# Patient Record
Sex: Female | Born: 1950 | Race: Black or African American | Hispanic: No | Marital: Single | Smoking: Current every day smoker
Health system: Southern US, Community
[De-identification: ages and names within clinical notes are randomized; demographics above are authoritative.]

## PROBLEM LIST (undated history)

## (undated) DIAGNOSIS — F319 Bipolar disorder, unspecified: Secondary | ICD-10-CM

## (undated) DIAGNOSIS — M4716 Other spondylosis with myelopathy, lumbar region: Secondary | ICD-10-CM

## (undated) DIAGNOSIS — F419 Anxiety disorder, unspecified: Secondary | ICD-10-CM

## (undated) DIAGNOSIS — I739 Peripheral vascular disease, unspecified: Secondary | ICD-10-CM

## (undated) DIAGNOSIS — G4730 Sleep apnea, unspecified: Secondary | ICD-10-CM

## (undated) DIAGNOSIS — K5900 Constipation, unspecified: Secondary | ICD-10-CM

## (undated) DIAGNOSIS — F1410 Cocaine abuse, uncomplicated: Secondary | ICD-10-CM

## (undated) DIAGNOSIS — F17200 Nicotine dependence, unspecified, uncomplicated: Secondary | ICD-10-CM

## (undated) DIAGNOSIS — E781 Pure hyperglyceridemia: Secondary | ICD-10-CM

## (undated) DIAGNOSIS — M1990 Unspecified osteoarthritis, unspecified site: Secondary | ICD-10-CM

## (undated) DIAGNOSIS — I499 Cardiac arrhythmia, unspecified: Secondary | ICD-10-CM

## (undated) DIAGNOSIS — I872 Venous insufficiency (chronic) (peripheral): Secondary | ICD-10-CM

## (undated) DIAGNOSIS — I471 Supraventricular tachycardia, unspecified: Secondary | ICD-10-CM

## (undated) DIAGNOSIS — M5430 Sciatica, unspecified side: Secondary | ICD-10-CM

## (undated) DIAGNOSIS — M47812 Spondylosis without myelopathy or radiculopathy, cervical region: Secondary | ICD-10-CM

## (undated) DIAGNOSIS — K5904 Chronic idiopathic constipation: Secondary | ICD-10-CM

## (undated) DIAGNOSIS — G894 Chronic pain syndrome: Secondary | ICD-10-CM

## (undated) DIAGNOSIS — M7071 Other bursitis of hip, right hip: Secondary | ICD-10-CM

## (undated) DIAGNOSIS — F449 Dissociative and conversion disorder, unspecified: Secondary | ICD-10-CM

## (undated) DIAGNOSIS — L209 Atopic dermatitis, unspecified: Secondary | ICD-10-CM

## (undated) DIAGNOSIS — M2012 Hallux valgus (acquired), left foot: Secondary | ICD-10-CM

## (undated) DIAGNOSIS — N952 Postmenopausal atrophic vaginitis: Secondary | ICD-10-CM

## (undated) DIAGNOSIS — K219 Gastro-esophageal reflux disease without esophagitis: Secondary | ICD-10-CM

## (undated) DIAGNOSIS — J449 Chronic obstructive pulmonary disease, unspecified: Secondary | ICD-10-CM

## (undated) DIAGNOSIS — G5790 Unspecified mononeuropathy of unspecified lower limb: Secondary | ICD-10-CM

## (undated) DIAGNOSIS — F1014 Alcohol abuse with alcohol-induced mood disorder: Secondary | ICD-10-CM

## (undated) DIAGNOSIS — R252 Cramp and spasm: Secondary | ICD-10-CM

## (undated) DIAGNOSIS — F4310 Post-traumatic stress disorder, unspecified: Secondary | ICD-10-CM

## (undated) DIAGNOSIS — R200 Anesthesia of skin: Secondary | ICD-10-CM

## (undated) DIAGNOSIS — R220 Localized swelling, mass and lump, head: Secondary | ICD-10-CM

## (undated) DIAGNOSIS — I10 Essential (primary) hypertension: Secondary | ICD-10-CM

## (undated) HISTORY — DX: Venous insufficiency (chronic) (peripheral): I87.2

## (undated) HISTORY — PX: COLONOSCOPY: SHX174

## (undated) HISTORY — PX: TONSILLECTOMY: SUR1361

## (undated) HISTORY — DX: Postmenopausal atrophic vaginitis: N95.2

## (undated) HISTORY — DX: Other bursitis of hip, right hip: M70.71

## (undated) HISTORY — DX: Cramp and spasm: R25.2

## (undated) HISTORY — DX: Chronic pain syndrome: G89.4

## (undated) HISTORY — DX: Anesthesia of skin: R20.0

## (undated) HISTORY — PX: FINGER SURGERY: SHX640

## (undated) HISTORY — DX: Nicotine dependence, unspecified, uncomplicated: F17.200

## (undated) HISTORY — PX: REVISION OF SCAR TISSUE RECTUS MUSCLE: SHX2351

## (undated) HISTORY — DX: Pure hyperglyceridemia: E78.1

## (undated) HISTORY — DX: Sciatica, unspecified side: M54.30

## (undated) HISTORY — DX: Atopic dermatitis, unspecified: L20.9

## (undated) HISTORY — DX: Gastro-esophageal reflux disease without esophagitis: K21.9

## (undated) HISTORY — DX: Alcohol abuse with alcohol-induced mood disorder: F10.14

## (undated) HISTORY — DX: Hallux valgus (acquired), left foot: M20.12

## (undated) HISTORY — DX: Chronic idiopathic constipation: K59.04

## (undated) HISTORY — PX: APPENDECTOMY: SHX54

## (undated) HISTORY — DX: Supraventricular tachycardia, unspecified: I47.10

## (undated) HISTORY — DX: Spondylosis without myelopathy or radiculopathy, cervical region: M47.812

## (undated) HISTORY — PX: ABDOMINAL HYSTERECTOMY: SHX81

## (undated) HISTORY — DX: Other spondylosis with myelopathy, lumbar region: M47.16

## (undated) HISTORY — PX: EYE SURGERY: SHX253

## (undated) HISTORY — PX: UVULOPALATOPHARYNGOPLASTY: SHX827

## (undated) HISTORY — DX: Localized swelling, mass and lump, head: R22.0

---

## 2005-12-01 ENCOUNTER — Ambulatory Visit: Payer: Self-pay | Admitting: Family Medicine

## 2005-12-13 ENCOUNTER — Ambulatory Visit (HOSPITAL_COMMUNITY): Admission: RE | Admit: 2005-12-13 | Discharge: 2005-12-13 | Payer: Self-pay | Admitting: Family Medicine

## 2006-03-19 ENCOUNTER — Ambulatory Visit: Payer: Self-pay | Admitting: Family Medicine

## 2006-06-06 ENCOUNTER — Emergency Department (HOSPITAL_COMMUNITY): Admission: EM | Admit: 2006-06-06 | Discharge: 2006-06-06 | Payer: Self-pay | Admitting: Emergency Medicine

## 2006-06-08 ENCOUNTER — Emergency Department (HOSPITAL_COMMUNITY): Admission: EM | Admit: 2006-06-08 | Discharge: 2006-06-08 | Payer: Self-pay | Admitting: Emergency Medicine

## 2006-07-09 ENCOUNTER — Ambulatory Visit: Payer: Self-pay | Admitting: Family Medicine

## 2006-08-10 ENCOUNTER — Ambulatory Visit (HOSPITAL_COMMUNITY): Admission: RE | Admit: 2006-08-10 | Discharge: 2006-08-10 | Payer: Self-pay | Admitting: Gastroenterology

## 2007-01-10 ENCOUNTER — Observation Stay (HOSPITAL_COMMUNITY): Admission: EM | Admit: 2007-01-10 | Discharge: 2007-01-11 | Payer: Self-pay | Admitting: Emergency Medicine

## 2007-01-11 ENCOUNTER — Encounter: Payer: Self-pay | Admitting: Cardiology

## 2007-01-11 ENCOUNTER — Ambulatory Visit: Payer: Self-pay | Admitting: Cardiology

## 2007-04-24 ENCOUNTER — Encounter: Payer: Self-pay | Admitting: Internal Medicine

## 2007-04-24 ENCOUNTER — Ambulatory Visit (HOSPITAL_COMMUNITY): Admission: RE | Admit: 2007-04-24 | Discharge: 2007-04-24 | Payer: Self-pay | Admitting: Internal Medicine

## 2007-04-24 ENCOUNTER — Ambulatory Visit: Payer: Self-pay | Admitting: Vascular Surgery

## 2007-04-24 ENCOUNTER — Emergency Department (HOSPITAL_COMMUNITY): Admission: EM | Admit: 2007-04-24 | Discharge: 2007-04-24 | Payer: Self-pay | Admitting: Emergency Medicine

## 2007-05-08 ENCOUNTER — Encounter: Admission: RE | Admit: 2007-05-08 | Discharge: 2007-05-08 | Payer: Self-pay | Admitting: Internal Medicine

## 2007-05-30 ENCOUNTER — Encounter: Admission: RE | Admit: 2007-05-30 | Discharge: 2007-05-30 | Payer: Self-pay | Admitting: Internal Medicine

## 2008-02-28 ENCOUNTER — Emergency Department (HOSPITAL_COMMUNITY): Admission: EM | Admit: 2008-02-28 | Discharge: 2008-02-28 | Payer: Self-pay | Admitting: Emergency Medicine

## 2008-06-08 ENCOUNTER — Ambulatory Visit (HOSPITAL_COMMUNITY): Admission: RE | Admit: 2008-06-08 | Discharge: 2008-06-09 | Payer: Self-pay | Admitting: Otolaryngology

## 2008-06-08 ENCOUNTER — Encounter (INDEPENDENT_AMBULATORY_CARE_PROVIDER_SITE_OTHER): Payer: Self-pay | Admitting: Otolaryngology

## 2008-07-21 ENCOUNTER — Ambulatory Visit (HOSPITAL_COMMUNITY): Admission: RE | Admit: 2008-07-21 | Discharge: 2008-07-21 | Payer: Self-pay | Admitting: Internal Medicine

## 2009-08-04 ENCOUNTER — Ambulatory Visit (HOSPITAL_COMMUNITY): Admission: RE | Admit: 2009-08-04 | Discharge: 2009-08-04 | Payer: Self-pay | Admitting: Internal Medicine

## 2010-06-09 ENCOUNTER — Emergency Department (HOSPITAL_COMMUNITY): Admission: EM | Admit: 2010-06-09 | Discharge: 2010-06-09 | Payer: Self-pay | Admitting: Emergency Medicine

## 2010-06-14 ENCOUNTER — Ambulatory Visit (HOSPITAL_COMMUNITY): Admission: RE | Admit: 2010-06-14 | Discharge: 2010-06-15 | Payer: Self-pay | Admitting: Orthopedic Surgery

## 2010-08-25 ENCOUNTER — Ambulatory Visit (HOSPITAL_COMMUNITY): Admission: RE | Admit: 2010-08-25 | Discharge: 2010-08-25 | Payer: Self-pay | Admitting: Internal Medicine

## 2010-11-06 ENCOUNTER — Encounter: Payer: Self-pay | Admitting: Internal Medicine

## 2010-11-07 ENCOUNTER — Encounter: Payer: Self-pay | Admitting: Internal Medicine

## 2010-12-30 LAB — CBC
MCH: 32.6 pg (ref 26.0–34.0)
MCHC: 34.5 g/dL (ref 30.0–36.0)
Platelets: 290 10*3/uL (ref 150–400)
RDW: 13.9 % (ref 11.5–15.5)

## 2010-12-30 LAB — COMPREHENSIVE METABOLIC PANEL
AST: 22 U/L (ref 0–37)
Albumin: 4.1 g/dL (ref 3.5–5.2)
CO2: 31 mEq/L (ref 19–32)
Calcium: 9.8 mg/dL (ref 8.4–10.5)
Creatinine, Ser: 0.84 mg/dL (ref 0.4–1.2)
GFR calc Af Amer: >60 mL/min (ref >60)
GFR calc non Af Amer: >60 mL/min (ref >60)
Total Protein: 7 g/dL (ref 6.0–8.3)

## 2010-12-30 LAB — DIFFERENTIAL
Eosinophils Relative: 8 % — ABNORMAL HIGH (ref 0–5)
Lymphocytes Relative: 39 % (ref 12–46)
Lymphs Abs: 2 10*3/uL (ref 0.7–4.0)

## 2010-12-30 LAB — SURGICAL PCR SCREEN: Staphylococcus aureus: NEGATIVE

## 2011-02-17 ENCOUNTER — Emergency Department (HOSPITAL_COMMUNITY): Payer: Medicare Other

## 2011-02-17 ENCOUNTER — Emergency Department (HOSPITAL_COMMUNITY)
Admission: EM | Admit: 2011-02-17 | Discharge: 2011-02-17 | Disposition: A | Discharge status: Home / Self Care | Payer: Medicare Other | Attending: Emergency Medicine | Admitting: Emergency Medicine

## 2011-02-17 DIAGNOSIS — R062 Wheezing: Secondary | ICD-10-CM | POA: Insufficient documentation

## 2011-02-17 DIAGNOSIS — R05 Cough: Secondary | ICD-10-CM | POA: Insufficient documentation

## 2011-02-17 DIAGNOSIS — J3489 Other specified disorders of nose and nasal sinuses: Secondary | ICD-10-CM | POA: Insufficient documentation

## 2011-02-17 DIAGNOSIS — J40 Bronchitis, not specified as acute or chronic: Secondary | ICD-10-CM | POA: Insufficient documentation

## 2011-02-17 DIAGNOSIS — K219 Gastro-esophageal reflux disease without esophagitis: Secondary | ICD-10-CM | POA: Insufficient documentation

## 2011-02-17 DIAGNOSIS — R51 Headache: Secondary | ICD-10-CM | POA: Insufficient documentation

## 2011-02-17 DIAGNOSIS — R059 Cough, unspecified: Secondary | ICD-10-CM | POA: Insufficient documentation

## 2011-02-17 DIAGNOSIS — R110 Nausea: Secondary | ICD-10-CM | POA: Insufficient documentation

## 2011-02-17 DIAGNOSIS — J4489 Other specified chronic obstructive pulmonary disease: Secondary | ICD-10-CM | POA: Insufficient documentation

## 2011-02-17 DIAGNOSIS — R0982 Postnasal drip: Secondary | ICD-10-CM | POA: Insufficient documentation

## 2011-02-17 DIAGNOSIS — R0989 Other specified symptoms and signs involving the circulatory and respiratory systems: Secondary | ICD-10-CM | POA: Insufficient documentation

## 2011-02-17 DIAGNOSIS — J449 Chronic obstructive pulmonary disease, unspecified: Secondary | ICD-10-CM | POA: Insufficient documentation

## 2011-02-17 DIAGNOSIS — R0609 Other forms of dyspnea: Secondary | ICD-10-CM | POA: Insufficient documentation

## 2011-02-17 DIAGNOSIS — R509 Fever, unspecified: Secondary | ICD-10-CM | POA: Insufficient documentation

## 2011-02-17 DIAGNOSIS — F319 Bipolar disorder, unspecified: Secondary | ICD-10-CM | POA: Insufficient documentation

## 2011-02-17 DIAGNOSIS — IMO0001 Reserved for inherently not codable concepts without codable children: Secondary | ICD-10-CM | POA: Insufficient documentation

## 2011-02-17 DIAGNOSIS — I10 Essential (primary) hypertension: Secondary | ICD-10-CM | POA: Insufficient documentation

## 2011-02-28 NOTE — Op Note (Signed)
[name], Harrell            ACCOUNT NO.:  [id number]   MEDICAL RECORD NO.:  [id number]          PATIENT TYPE:  OIB   LOCATION:  3312                         FACILITY:  [facility]   PHYSICIAN:  Kristine Garbe. Ezzard Standing, M.D.DATE OF BIRTH:  06-Jul-1951   DATE OF PROCEDURE:  06/08/2008  DATE OF DISCHARGE:                               OPERATIVE REPORT   PREOPERATIVE DIAGNOSES:  1. Obstructive sleep apnea.  2. 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   POSTOPERATIVE DIAGNOSES:  1. Obstructive sleep apnea.  2. 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   OPERATION PERFORMED:  1. Uvulopalatopharyngoplasty with tonsillectomy.  2. Septoplasty with bilateral inferior turbinate reductions.   SURGEON:  Kristine Garbe. Ezzard Standing, MD   ANESTHESIA:  General endotracheal.   COMPLICATIONS:  None.   BRIEF CLINICAL NOTE:  Cassie Harrell is a 60 year old female with a  bipolar disorder as well as fibromyalgia and osteoarthritis.  She has  obstructive sleep apnea and presently using nasal CPAP.  She really does  not tolerate the nasal CPAP well and wants to know if there are any  surgical options for her obstructive sleep apnea.  Discussed extensively  with her the risks and benefits of UPPP with tonsillectomy.  She also  has some nasal breathing issues which makes it more difficult for her to  tolerate nasal CPAP and has a moderate septal deformity with large  turbinates.  She is taken to operating room at this time for  septoplasty, turbinate reductions, and a UPPP with tonsillectomy.   DESCRIPTION OF PROCEDURE:  After adequate endotracheal anesthesia, the  patient's nose was prepped with Betadine solution and dried out with  sterile towels.  She received 1 g of Ancef IV preoperatively.  The nose  was then further prepped with cotton pledgets soaked in Afrin, and the  septum turbinates were injected with Xylocaine with epinephrine.  A  hemitransfixion incision was made along the caudal edge of the septum on  the right side.  Mucoperiosteal flaps were elevated posteriorly.  The  patient had a deformity of the anterior cartilaginous septum to the left  airway.  The portion that deviated off of the crest in the left airway  was removed.  In addition, a portion of the crest that abutted into the  left airway was removed with osteotome.  This allowed the septum to  return more to midline.  The hemitransfixion incision was closed with  interrupted 4-0 chromic sutures.  The septum was basted with a 3-0  chromic suture.  Inferior turbinate reductions were then performed by  elevating mucosa off of the medial aspect of the turbinate bone and then  the portion of the turbinate bone and lateral turbinate mucosa was  amputated.  Remaining turbinate tissue was outfractured and cauterized  for hemostasis.  The nose was packed with Telfa soaked in bacitracin  ointment.  A mouth gag was then used to expose the oropharynx.  The left  and right tonsils were resected from the tonsillar fossa using the  cautery.  Hemostasis was obtained with the cautery.  The base of the  uvula was  then amputated and about 0.5 cm of distal soft palate was  cauterized.  The mucosal edges on either side of the palate were then  reapproximated with 3-0 chromic suture.  This completed the  tonsillectomy and UPPP.  Oropharynx was irrigated with saline.  The  patient was awoken from anesthesia and transferred to the recovery room.  Postop, doing well.  The nose was packed with Telfa soaked in bacitracin  ointment prior to waking up.   DISPOSITION:  The patient will be observed for 23 hours and plan  discharge in the morning after removing nasal packs.  We will plan a  discharge to home on amoxicillin suspension 500 mg b.i.d. for 1 week,  Tylenol and Darvocet for pain.  However, follow up my office in 2 weeks  for recheck.            ______________________________  Kristine Garbe. Ezzard Standing, M.D.     CEN/MEDQ  D:  06/08/2008  T:  06/09/2008  Job:  [id number]   cc:   Fleet Contras, M.D.

## 2011-03-03 NOTE — Op Note (Signed)
Cassie Harrell, Cassie Harrell            ACCOUNT NO.:  [id number]   MEDICAL RECORD NO.:  [id number]          PATIENT TYPE:  AMB   LOCATION:  ENDO                         FACILITY:  [facility]   PHYSICIAN:  Graylin Shiver, M.D.   DATE OF BIRTH:  02/05/1951   DATE OF PROCEDURE:  08/10/2006  DATE OF DISCHARGE:  08/10/2006                                 OPERATIVE REPORT   INDICATIONS FOR PROCEDURE:  Rectal bleeding.   Informed consent was obtained after explanation of the risks of bleeding,  infection, and perforation.   PREMEDICATION:  Fentanyl 125 mcg IV, fentanyl 125 mcg IV, Versed 6.5 mg IV.   PROCEDURE:  With the patient in left lateral decubitus position, a rectal  exam was performed.  No masses were felt.  The Olympus colonoscope was  inserted into the rectum and advanced around the colon to the cecum.  Cecal  landmarks were identified.  The cecum and ascending colon were normal.  The  transverse colon was normal.  The descending colon and sigmoid looked  normal.  The rectum looked normal.  The scope was retroflexed in the rectum,  and internal hemorrhoids were seen.  She tolerated the procedure well  without complications.   IMPRESSION:  Normal colonoscopy, with internal hemorrhoids which I believe  would be the source of her rectal bleeding.           ______________________________  Graylin Shiver, M.D.     SFG/MEDQ  D:  08/10/2006  T:  08/12/2006  Job:  [id number]   cc:   Dineen Kid. Reche Dixon, M.D.

## 2011-03-03 NOTE — H&P (Signed)
NAMEISRA, Cassie            ACCOUNT NO.:  [id number]   MEDICAL RECORD NO.:  [id number]          PATIENT TYPE:  INP   LOCATION:  3703                         FACILITY:  [facility]   PHYSICIAN:  Lonia Blood, M.D.      DATE OF BIRTH:  10-18-1950   DATE OF ADMISSION:  01/10/2007  DATE OF DISCHARGE:                              HISTORY & PHYSICAL   PRIMARY CARE PHYSICIAN:  Patient goes to [facility].   PRESENTING COMPLAINT:  Left-sided chest pain.   HPI:  Patient is a 60 year old female with known history of bipolar  disorder, polysubstance abuse, etc., who presented with left-sided chest  pain.  Pain is said to be in the precordial region.  It worsens with  movement.  Especially lying on the left side or occasionally leaning  forward.  No shortness of breath.  Denied any fevers.  She has some  cough, mildly, but that has also been there for awhile.  The patient has  had similar chest pain before.  She was seen, at that time, but MI was  ruled out.   PAST MEDICAL HISTORY:  Significant for:  1. Bipolar disorder.  2. History of hemorrhoids.  3. Polysubstance abuse, including tobacco and alcohol.  4. Posttraumatic stress disorder.   ALLERGIES:  PATIENT IS ALLERGIC TO MULTIPLE MEDICATIONS, INCLUDING:  1. CODEINE.  2. HYDROCODONE.  3. MOBIC.  4. PERCOCET.  5. NEURONTIN.   MEDICATIONS:  Currently, on:  1. Omeprazole 20 mg daily.  2. Lexapro 20 mg daily.  3. Lisinopril HCTZ 10/12.5 one tablet daily.   SOCIAL HISTORY:  Patient lives alone.  Denied any tobacco or alcohol  use, but subjectively and objectively patient has been smoking about a  half to 1 pack per day.  She has mental disorders with multiple  admissions.   FAMILY HISTORY:  Patient denied any family history of mental disease.  No history of diabetes.  Some history of hypertension in her significant  family.   REVIEW OF SYSTEMS:  Mainly tiredness and hungry; otherwise, 12-point  review of systems is per HPI.   PHYSICAL EXAMINATION:  VITAL SIGNS:  Patient is afebrile with a blood  pressure of 144/81, pulse 85, respiratory rate 20 and a saturations 100%  on room air.  GENERALLY:  Patient is awake, alert and oriented, pleasant woman in no  acute distress.  HEENT:  PERRL.  EOMI.  NECK:  Supple.  No JVD.  No  lymphadenopathy.  RESPIRATORY:  She has good air entry bilaterally.  No wheezes, no rales,  no crackles.  CARDIOVASCULAR SYSTEM:  She has S1, S2.  No rub.  No murmur.  Chest pain  is reproducible with motion.  ABDOMEN:  Soft, nontender with positive bowel sounds.  EXTREMITIES:  Show no edema, cyanosis or clubbing.   LABS:  Showed a sodium of 138, potassium 4.0, chloride 108, CO2 25,  glucose 96, BUN 10, creatinine 0.77, calcium 8.9.  Normal LFTs.  White  count 4.6, hemoglobin 13.0, platelet count 262.  BNP is 36.  Hemoglobin  A1c 5.2.  Initial cardiac enzymes are negative.  PT 12.6, INR 0.9.  TSH  also 0.548.  UA essentially negative.  EKG showed normal sinus rhythm,  no ST-T wave changes.   ASSESSMENT:  This is a 60 year old female with what appears to be  atypical chest pain.  If anything, a little bit more pleuritic in  nature.  The patient has risk factors including hypertension, tobacco  use and unknown cholesterol status.  We will, therefore, admit the  patient for rule out myocardial infarction.   PLAN:  Chest pain.  We will admit the patient to telemetry bed.  We will  check serial cardiac enzymes, rule out myocardial infarction.  Once  myocardial infarction is ruled out, we may refer patient for outpatient  stress testing.  In the meantime, based on the nature of the chest pain,  I will also consider possible pericarditis.  Chest x-ray essentially  normal, indicating that there is no pneumonia at this point.  We will  check 2D echo results, depending on the results if pericarditis is ruled  out, we will discharge the patient.  Meanwhile, I will put her on some  NSAIDs, some  nitro and aspirin.  We will continue with ACE inhibitor and  beta-blocker.      Lonia Blood, M.D.  Electronically Signed     LG/MEDQ  D:  01/11/2007  T:  01/11/2007  Job:  [id number]

## 2011-07-12 LAB — POCT I-STAT, CHEM 8
Creatinine, Ser: 1.1
Glucose, Bld: 88
Hemoglobin: 13.9
Sodium: 140
TCO2: 25

## 2011-07-12 LAB — POCT CARDIAC MARKERS
Myoglobin, poc: 78.4
Operator id: 161631

## 2011-08-01 LAB — CBC
MCHC: 34.1
Platelets: 301
RDW: 13.7

## 2011-08-01 LAB — POCT I-STAT CREATININE: Creatinine, Ser: 1.1

## 2011-08-01 LAB — I-STAT 8, (EC8 V) (CONVERTED LAB)
Acid-Base Excess: 1
Bicarbonate: 26 — ABNORMAL HIGH
HCT: 44
Operator id: 189501
pCO2, Ven: 41.3 — ABNORMAL LOW

## 2011-08-01 LAB — DIFFERENTIAL
Basophils Absolute: 0.1
Basophils Relative: 1
Neutro Abs: 2.1
Neutrophils Relative %: 42 — ABNORMAL LOW

## 2011-09-08 ENCOUNTER — Other Ambulatory Visit (HOSPITAL_COMMUNITY): Payer: Self-pay | Admitting: Internal Medicine

## 2011-09-08 DIAGNOSIS — Z1231 Encounter for screening mammogram for malignant neoplasm of breast: Secondary | ICD-10-CM

## 2011-09-23 ENCOUNTER — Emergency Department (HOSPITAL_COMMUNITY)
Admission: EM | Admit: 2011-09-23 | Discharge: 2011-09-23 | Disposition: A | Discharge status: Home / Self Care | Payer: Medicare Other | Attending: Emergency Medicine | Admitting: Emergency Medicine

## 2011-09-23 ENCOUNTER — Encounter: Payer: Self-pay | Admitting: Emergency Medicine

## 2011-09-23 DIAGNOSIS — L2989 Other pruritus: Secondary | ICD-10-CM | POA: Insufficient documentation

## 2011-09-23 DIAGNOSIS — J449 Chronic obstructive pulmonary disease, unspecified: Secondary | ICD-10-CM | POA: Insufficient documentation

## 2011-09-23 DIAGNOSIS — F319 Bipolar disorder, unspecified: Secondary | ICD-10-CM | POA: Insufficient documentation

## 2011-09-23 DIAGNOSIS — H409 Unspecified glaucoma: Secondary | ICD-10-CM | POA: Insufficient documentation

## 2011-09-23 DIAGNOSIS — L298 Other pruritus: Secondary | ICD-10-CM | POA: Insufficient documentation

## 2011-09-23 DIAGNOSIS — M1990 Unspecified osteoarthritis, unspecified site: Secondary | ICD-10-CM | POA: Insufficient documentation

## 2011-09-23 DIAGNOSIS — Z79899 Other long term (current) drug therapy: Secondary | ICD-10-CM | POA: Insufficient documentation

## 2011-09-23 DIAGNOSIS — R21 Rash and other nonspecific skin eruption: Secondary | ICD-10-CM | POA: Insufficient documentation

## 2011-09-23 DIAGNOSIS — L309 Dermatitis, unspecified: Secondary | ICD-10-CM

## 2011-09-23 DIAGNOSIS — L259 Unspecified contact dermatitis, unspecified cause: Secondary | ICD-10-CM | POA: Insufficient documentation

## 2011-09-23 DIAGNOSIS — I10 Essential (primary) hypertension: Secondary | ICD-10-CM | POA: Insufficient documentation

## 2011-09-23 DIAGNOSIS — J4489 Other specified chronic obstructive pulmonary disease: Secondary | ICD-10-CM | POA: Insufficient documentation

## 2011-09-23 DIAGNOSIS — R070 Pain in throat: Secondary | ICD-10-CM | POA: Insufficient documentation

## 2011-09-23 HISTORY — DX: Sleep apnea, unspecified: G47.30

## 2011-09-23 HISTORY — DX: Bipolar disorder, unspecified: F31.9

## 2011-09-23 HISTORY — DX: Unspecified osteoarthritis, unspecified site: M19.90

## 2011-09-23 HISTORY — DX: Essential (primary) hypertension: I10

## 2011-09-23 HISTORY — DX: Chronic obstructive pulmonary disease, unspecified: J44.9

## 2011-09-23 MED ORDER — DIPHENHYDRAMINE HCL 25 MG PO CAPS
25.0000 mg | ORAL_CAPSULE | Freq: Once | ORAL | Status: AC
  Administered 2011-09-23: 25 mg via ORAL
  Filled 2011-09-23: qty 1

## 2011-09-23 MED ORDER — PREDNISONE 20 MG PO TABS
60.0000 mg | ORAL_TABLET | Freq: Once | ORAL | Status: AC
  Administered 2011-09-23: 60 mg via ORAL
  Filled 2011-09-23: qty 3

## 2011-09-23 MED ORDER — DIPHENHYDRAMINE HCL 25 MG PO TABS: 25.0000 mg | ORAL_TABLET | Freq: Four times a day (QID) | ORAL | Status: AC | PRN

## 2011-09-23 MED ORDER — PERMETHRIN 5 % EX CREA: TOPICAL_CREAM | CUTANEOUS | Status: AC

## 2011-09-23 MED ORDER — PREDNISONE 20 MG PO TABS: ORAL_TABLET | ORAL | Status: AC

## 2011-09-23 NOTE — ED Provider Notes (Signed)
History     CSN: [id number] Arrival date & time: 09/23/2011  3:45 PM   First MD Initiated Contact with Patient 09/23/11 1827      Chief Complaint  Patient presents with  . Sore Throat  . Rash    (Consider location/radiation/quality/duration/timing/severity/associated sxs/prior treatment) Patient is a 60 y.o. female presenting with rash. The history is provided by the patient.  Rash   pt c/o very pruritic rash to extremities, esp forearms/hands, and trunk, for past 4 weeks. No hx same rash. States other than possibly changing detergent, denies other change in any home and/or personal products. No change in food/diet. No outdoor exposure or new pets. No recent febrile or viral illness. No fever or chills. States pcp gave triamcinolone cream which hasnt helped. No palms or soles. No mm involvement, although states felt a sore area on palate earlier - not currently. No trouble breathing or swallowing.   Past Medical History  Diagnosis Date  . COPD (chronic obstructive pulmonary disease)   . Hypertension   . Fibromyalgia   . Osteoarthritis   . Bipolar 1 disorder   . Glaucoma   . Sleep apnea     Past Surgical History  Procedure Date  . Abdominal hysterectomy   . Cesarean section   . Eye surgery   . Revision of scar tissue rectus muscle   . Uvulopalatopharyngoplasty     History reviewed. No pertinent family history.  History  Substance Use Topics  . Smoking status: Current Some Day Smoker  . Smokeless tobacco: Not on file  . Alcohol Use: Yes    OB History    Grav Para Term Preterm Abortions TAB SAB Ect Mult Living                  Review of Systems  Constitutional: Negative for fever and chills.  HENT: Negative for rhinorrhea and neck pain.   Eyes: Negative for redness.  Respiratory: Negative for shortness of breath.   Cardiovascular: Negative for chest pain.  Gastrointestinal: Negative for abdominal pain.  Genitourinary: Negative for flank pain.    Musculoskeletal: Negative for back pain.  Skin: Positive for rash.  Neurological: Negative for headaches.  Hematological: Does not bruise/bleed easily.    Allergies  Codeine; Mobic; and Imipramine  Home Medications   Current Outpatient Rx  Name Route Sig Dispense Refill  . CLONAZEPAM 1 MG PO TABS Oral Take 1 mg by mouth 3 (three) times daily.      Marland Kitchen DOXYCYCLINE HYCLATE 100 MG PO TABS Oral Take 100 mg by mouth 2 (two) times daily.      Marland Kitchen ESCITALOPRAM OXALATE 20 MG PO TABS Oral Take 20 mg by mouth daily.      Marland Kitchen LISINOPRIL-HYDROCHLOROTHIAZIDE PO Oral Take 1 tablet by mouth daily.      Marland Kitchen OMEPRAZOLE 20 MG PO CPDR Oral Take 20 mg by mouth daily.      Marland Kitchen TIOTROPIUM BROMIDE MONOHYDRATE 18 MCG IN CAPS Inhalation Place 18 mcg into inhaler and inhale daily.      . TRIAMCINOLONE ACETONIDE 0.5 % EX CREA Topical Apply 1 application topically 3 (three) times daily.      Marland Kitchen ZIPRASIDONE HCL 40 MG PO CAPS Oral Take 40 mg by mouth 2 (two) times daily with a meal.        BP 117/67  Pulse 68  Temp(Src) 98.4 F (36.9 C) (Oral)  Resp 16  SpO2 100%  Physical Exam  Nursing note and vitals reviewed. Constitutional: She is oriented  to person, place, and time. She appears well-developed and well-nourished. No distress.  HENT:  Mouth/Throat: Oropharynx is clear and moist.       No mm lesions noted.   Eyes: Conjunctivae are normal. No scleral icterus.  Neck: Neck supple. No tracheal deviation present.  Cardiovascular: Normal rate, regular rhythm, normal heart sounds and intact distal pulses.   Pulmonary/Chest: Effort normal. No respiratory distress. She has no wheezes.  Abdominal: Soft. Normal appearance. She exhibits no distension. There is no tenderness.  Musculoskeletal: She exhibits no edema and no tenderness.  Lymphadenopathy:    She has no cervical adenopathy.  Neurological: She is alert and oriented to person, place, and time.  Skin: Skin is warm and dry. Rash noted.       Pt w sparse areas to  forearm and dorsum hands, and ?small patch on dorsum feet, trunk, of sl raised small 1-2 mm papules, sl erythema. No definite tracks/burrows. No hives/urticaria. No mm lesions or lesions on palms/soles. Multiple scratch marks noted.   Psychiatric: She has a normal mood and affect.    ED Course  Procedures (including critical care time)     MDM  Etiology symptoms not entirely clear. rec return to prior detergent, although change not clearly related to symptom onset. As c/o diffuse itching of areas/lesions, will try tapering course pred +benadryl for symptom relief. As few areas appear concerning for possible scabies will rx permethrin. rec close derm f/u if symptoms fail to improve/resolve w above tx.         Suzi Roots, MD 09/23/11 [phone]

## 2011-09-23 NOTE — ED Notes (Signed)
Pt reports awoke today with feeling like a lump in her throat, pt also reports generalized body rash x 4 weeks. Pt seen by PMD for same and given prescription meds.

## 2011-10-12 ENCOUNTER — Ambulatory Visit (HOSPITAL_COMMUNITY): Payer: Medicare Other

## 2011-10-24 ENCOUNTER — Other Ambulatory Visit: Payer: Self-pay | Admitting: Dermatology

## 2011-11-09 ENCOUNTER — Ambulatory Visit (HOSPITAL_COMMUNITY): Payer: Medicare Other

## 2011-12-08 ENCOUNTER — Ambulatory Visit (HOSPITAL_COMMUNITY)
Admission: RE | Admit: 2011-12-08 | Discharge: 2011-12-08 | Disposition: A | Discharge status: Home / Self Care | Payer: Medicare Other | Source: Ambulatory Visit | Attending: Internal Medicine | Admitting: Internal Medicine

## 2011-12-08 DIAGNOSIS — Z1231 Encounter for screening mammogram for malignant neoplasm of breast: Secondary | ICD-10-CM

## 2012-05-23 ENCOUNTER — Encounter (HOSPITAL_COMMUNITY): Payer: Self-pay | Admitting: Emergency Medicine

## 2012-05-23 ENCOUNTER — Emergency Department (HOSPITAL_COMMUNITY)
Admission: EM | Admit: 2012-05-23 | Discharge: 2012-05-24 | Disposition: A | Discharge status: Home / Self Care | Payer: Medicare Other | Attending: Emergency Medicine | Admitting: Emergency Medicine

## 2012-05-23 ENCOUNTER — Emergency Department (HOSPITAL_COMMUNITY): Payer: Medicare Other

## 2012-05-23 DIAGNOSIS — B9689 Other specified bacterial agents as the cause of diseases classified elsewhere: Secondary | ICD-10-CM | POA: Insufficient documentation

## 2012-05-23 DIAGNOSIS — R109 Unspecified abdominal pain: Secondary | ICD-10-CM

## 2012-05-23 DIAGNOSIS — A499 Bacterial infection, unspecified: Secondary | ICD-10-CM | POA: Insufficient documentation

## 2012-05-23 DIAGNOSIS — F319 Bipolar disorder, unspecified: Secondary | ICD-10-CM | POA: Insufficient documentation

## 2012-05-23 DIAGNOSIS — G4730 Sleep apnea, unspecified: Secondary | ICD-10-CM | POA: Insufficient documentation

## 2012-05-23 DIAGNOSIS — J4489 Other specified chronic obstructive pulmonary disease: Secondary | ICD-10-CM | POA: Insufficient documentation

## 2012-05-23 DIAGNOSIS — Z79899 Other long term (current) drug therapy: Secondary | ICD-10-CM | POA: Insufficient documentation

## 2012-05-23 DIAGNOSIS — J449 Chronic obstructive pulmonary disease, unspecified: Secondary | ICD-10-CM | POA: Insufficient documentation

## 2012-05-23 DIAGNOSIS — N760 Acute vaginitis: Secondary | ICD-10-CM | POA: Insufficient documentation

## 2012-05-23 DIAGNOSIS — K5900 Constipation, unspecified: Secondary | ICD-10-CM | POA: Insufficient documentation

## 2012-05-23 DIAGNOSIS — R112 Nausea with vomiting, unspecified: Secondary | ICD-10-CM

## 2012-05-23 DIAGNOSIS — F17200 Nicotine dependence, unspecified, uncomplicated: Secondary | ICD-10-CM | POA: Insufficient documentation

## 2012-05-23 DIAGNOSIS — I10 Essential (primary) hypertension: Secondary | ICD-10-CM | POA: Insufficient documentation

## 2012-05-23 LAB — CBC WITH DIFFERENTIAL/PLATELET
Eosinophils Absolute: 0.7 10*3/uL (ref 0.0–0.7)
Eosinophils Relative: 11 % — ABNORMAL HIGH (ref 0–5)
Lymphs Abs: 2.9 10*3/uL (ref 0.7–4.0)
MCH: 32.5 pg (ref 26.0–34.0)
MCV: 92 fL (ref 78.0–100.0)
Monocytes Relative: 6 % (ref 3–12)
Platelets: 291 10*3/uL (ref 150–400)
RBC: 4.4 MIL/uL (ref 3.87–5.11)

## 2012-05-23 MED ORDER — SODIUM CHLORIDE 0.9 % IV BOLUS (SEPSIS)
1000.0000 mL | Freq: Once | INTRAVENOUS | Status: AC
  Administered 2012-05-24: 1000 mL via INTRAVENOUS

## 2012-05-23 MED ORDER — ONDANSETRON HCL 4 MG/2ML IJ SOLN
4.0000 mg | Freq: Once | INTRAMUSCULAR | Status: AC
  Administered 2012-05-24: 4 mg via INTRAVENOUS
  Filled 2012-05-23: qty 2

## 2012-05-23 MED ORDER — HYDROMORPHONE HCL PF 1 MG/ML IJ SOLN
0.5000 mg | Freq: Once | INTRAMUSCULAR | Status: AC
  Administered 2012-05-24: 1 mg via INTRAVENOUS
  Filled 2012-05-23: qty 1

## 2012-05-23 NOTE — ED Notes (Signed)
Patient called EMS for abdominal pain and constipation for 1 week.  Also c/o nausea without vomiting.

## 2012-05-23 NOTE — ED Provider Notes (Signed)
History     CSN: [id number]  Arrival date & time 05/23/12  2200   First MD Initiated Contact with Patient 05/23/12 2305      Chief Complaint  Patient presents with  . Abdominal Pain  . Constipation    (Consider location/radiation/quality/duration/timing/severity/associated sxs/prior treatment) HPI Comments: 61 y/o female with worsening abdominal pain since last Friday. Pain located more on the left than right, but is generalized. Describes it as a cramping and sharp pain rated 8/10. Admits to constipation since Friday. She has not moved her bowels at all despite using stool softeners and suppositories. Admits to nausea and one episode of vomiting today. She has had an appetite and has been able to keep food down. States she has white vaginal discharge with a bad odor as well. Admits to unprotected intercourse earlier last week with a new partner. There is some associated pelvic pressure. Denies vaginal bleeding, hematuria, dysuria, frequency, urgency, fever, chills. She has a history of surgical procedures in abdomen to remove adhesions.  Patient is a 61 y.o. female presenting with abdominal pain and constipation. The history is provided by the patient.  Abdominal Pain The primary symptoms of the illness include abdominal pain, nausea, vomiting and vaginal discharge. The primary symptoms of the illness do not include fever, shortness of breath, dysuria or vaginal bleeding.  The vaginal discharge is not associated with dysuria.   Additional symptoms associated with the illness include constipation. Symptoms associated with the illness do not include chills, hematuria or frequency.  Constipation  Associated symptoms include abdominal pain, nausea, vomiting and vaginal discharge. Pertinent negatives include no fever, no hematuria and no vaginal bleeding.    Past Medical History  Diagnosis Date  . COPD (chronic obstructive pulmonary disease)   . Hypertension   . Fibromyalgia   .  Osteoarthritis   . Bipolar 1 disorder   . Glaucoma   . Sleep apnea     Past Surgical History  Procedure Date  . Abdominal hysterectomy   . Cesarean section   . Eye surgery   . Revision of scar tissue rectus muscle   . Uvulopalatopharyngoplasty     No family history on file.  History  Substance Use Topics  . Smoking status: Current Some Day Smoker  . Smokeless tobacco: Not on file  . Alcohol Use: Yes    OB History    Grav Para Term Preterm Abortions TAB SAB Ect Mult Living                  Review of Systems  Constitutional: Negative for fever and chills.  Respiratory: Negative for shortness of breath.   Gastrointestinal: Positive for nausea, vomiting, abdominal pain and constipation.  Genitourinary: Positive for vaginal discharge and pelvic pain. Negative for dysuria, frequency, hematuria and vaginal bleeding.    Allergies  Codeine; Meloxicam; and Imipramine  Home Medications   Current Outpatient Rx  Name Route Sig Dispense Refill  . ALBUTEROL SULFATE HFA 108 (90 BASE) MCG/ACT IN AERS Inhalation Inhale 2 puffs into the lungs every 6 (six) hours as needed.    Marland Kitchen CLONAZEPAM 1 MG PO TABS Oral Take 1 mg by mouth 3 (three) times daily as needed. For anxiety    . DOCUSATE SODIUM 100 MG PO CAPS Oral Take 100 mg by mouth 4 (four) times daily as needed. For constipation    . ESCITALOPRAM OXALATE 20 MG PO TABS Oral Take 20 mg by mouth daily.      Marland Kitchen HYDROXYZINE PAMOATE 25 MG  PO CAPS Oral Take 25 mg by mouth 3 (three) times daily as needed. For anxiety    . LISINOPRIL-HYDROCHLOROTHIAZIDE 20-12.5 MG PO TABS Oral Take 1 tablet by mouth daily.    Marland Kitchen OMEPRAZOLE 20 MG PO CPDR Oral Take 20 mg by mouth daily.      . DISPOSABLE ENEMA 19-7 GM/118ML RE ENEM Rectal Place 1 enema rectally once. follow package directions    . TIOTROPIUM BROMIDE MONOHYDRATE 18 MCG IN CAPS Inhalation Place 18 mcg into inhaler and inhale daily.      . TRAMADOL HCL 50 MG PO TABS Oral Take 50 mg by mouth every 8  (eight) hours as needed. For pain    . ZIPRASIDONE HCL 20 MG PO CAPS Oral Take 20 mg by mouth at bedtime.      BP 118/75  Pulse 74  Temp 98.8 F (37.1 C) (Oral)  Resp 18  Physical Exam  Nursing note and vitals reviewed. Constitutional: She is oriented to person, place, and time. She appears well-developed and well-nourished. No distress.  HENT:  Head: Normocephalic and atraumatic.  Mouth/Throat: Oropharynx is clear and moist.  Eyes: Conjunctivae and EOM are normal. Pupils are equal, round, and reactive to light.  Neck: Neck supple.  Cardiovascular: Normal rate, regular rhythm and normal heart sounds.   Pulmonary/Chest: Effort normal and breath sounds normal.  Abdominal: Soft. Bowel sounds are normal. She exhibits no distension. There is tenderness in the epigastric area, suprapubic area and left lower quadrant. There is guarding. There is no rigidity and no rebound.  Genitourinary: Uterus is tender. Uterus is not enlarged. Cervix exhibits discharge (copious white with fishy odor). Cervix exhibits no motion tenderness and no friability. Right adnexum displays no mass and no tenderness. Left adnexum displays no mass and no tenderness. No erythema, tenderness or bleeding around the vagina. Vaginal discharge (white) found.  Neurological: She is alert and oriented to person, place, and time.  Skin: Skin is warm and dry. No rash noted.  Psychiatric: She has a normal mood and affect. Her speech is delayed. She is slowed.    ED Course  Procedures (including critical care time)   Labs Reviewed  CBC WITH DIFFERENTIAL  COMPREHENSIVE METABOLIC PANEL  LIPASE, BLOOD  URINALYSIS, ROUTINE W REFLEX MICROSCOPIC  GC/CHLAMYDIA PROBE AMP, URINE  WET PREP, GENITAL   No results found.   No diagnosis found.    MDM  61 y/o female with abdominal pain and constipation last Friday. Wet prep showing clue cells explaining patient's white vaginal discharge. Treatment needed for BV. Acute abdominal  series not showing obstruction. Will obtain CT scan due to abdominal tenderness and elevated lipase. Will give fleets enema. 12:32 AM Patient resting comfortably in bed. States nausea and abdominal pain have decreased since receiving medicine. Awaiting CT scan.  Case discussed with Dr. Sunnie Nielsen at 12:50 AM who will take over care of the patient at this time.        Trevor Mace, PA-C 05/25/12 [phone]

## 2012-05-23 NOTE — ED Notes (Signed)
Bed:[facility]<BR> Expected date:05/23/12<BR> Expected time:10:02 PM<BR> Means of arrival:Ambulance<BR> Comments:<BR> Lower abd pain

## 2012-05-24 ENCOUNTER — Emergency Department (HOSPITAL_COMMUNITY): Payer: Medicare Other

## 2012-05-24 LAB — COMPREHENSIVE METABOLIC PANEL
BUN: 15 mg/dL (ref 6–23)
Calcium: 9.7 mg/dL (ref 8.4–10.5)
GFR calc Af Amer: >90 mL/min (ref >90)
Glucose, Bld: 71 mg/dL (ref 70–99)
Sodium: 134 mEq/L — ABNORMAL LOW (ref 135–145)
Total Protein: 7.1 g/dL (ref 6.0–8.3)

## 2012-05-24 LAB — WET PREP, GENITAL
Trich, Wet Prep: NONE SEEN
Yeast Wet Prep HPF POC: NONE SEEN

## 2012-05-24 LAB — LIPASE, BLOOD: Lipase: 92 U/L — ABNORMAL HIGH (ref 11–59)

## 2012-05-24 LAB — URINALYSIS, ROUTINE W REFLEX MICROSCOPIC
Nitrite: NEGATIVE
Specific Gravity, Urine: 1.011 (ref 1.005–1.030)
Urobilinogen, UA: 0.2 mg/dL (ref 0.0–1.0)

## 2012-05-24 MED ORDER — HYDROCODONE-ACETAMINOPHEN 5-325 MG PO TABS: 1.0000 | ORAL_TABLET | ORAL | Status: AC | PRN

## 2012-05-24 MED ORDER — FLEET ENEMA 7-19 GM/118ML RE ENEM
1.0000 | ENEMA | Freq: Once | RECTAL | Status: AC
  Administered 2012-05-24: 01:00:00 via RECTAL
  Filled 2012-05-24: qty 1

## 2012-05-24 MED ORDER — DOCUSATE SODIUM 100 MG PO CAPS: 100.0000 mg | ORAL_CAPSULE | Freq: Two times a day (BID) | ORAL | Status: AC

## 2012-05-24 MED ORDER — CIPROFLOXACIN HCL 500 MG PO TABS: 500.0000 mg | ORAL_TABLET | Freq: Two times a day (BID) | ORAL | Status: AC

## 2012-05-24 MED ORDER — METRONIDAZOLE 500 MG PO TABS: 500.0000 mg | ORAL_TABLET | Freq: Two times a day (BID) | ORAL | Status: AC

## 2012-05-24 MED ORDER — CEFTRIAXONE SODIUM 1 G IJ SOLR
1.0000 g | Freq: Once | INTRAMUSCULAR | Status: AC
  Administered 2012-05-24: 1 g via INTRAMUSCULAR
  Filled 2012-05-24: qty 10

## 2012-05-24 MED ORDER — IOHEXOL 300 MG/ML  SOLN
100.0000 mL | Freq: Once | INTRAMUSCULAR | Status: AC | PRN
  Administered 2012-05-24: 100 mL via INTRAVENOUS

## 2012-05-24 MED ORDER — AZITHROMYCIN 1 G PO PACK
1.0000 g | PACK | Freq: Once | ORAL | Status: AC
  Administered 2012-05-24: 1 g via ORAL
  Filled 2012-05-24: qty 1

## 2012-05-24 NOTE — ED Notes (Signed)
Patient is alert and oriented x3.  She was given DC instructions and follow up visit instructions.  Patient gave verbal understanding. She was DC ambulatory under his own power to home.  V/S stable.  He was not showing any signs of distress on DC 

## 2012-05-24 NOTE — ED Notes (Signed)
Patient transported to CT 

## 2012-05-26 NOTE — ED Provider Notes (Signed)
Results for orders placed during the hospital encounter of 05/23/12  CBC WITH DIFFERENTIAL      Component Value Range   WBC 6.7  4.0 - 10.5 K/uL   RBC 4.40  3.87 - 5.11 MIL/uL   Hemoglobin 14.3  12.0 - 15.0 g/dL   HCT 09.8  11.9 - 14.7 %   MCV 92.0  78.0 - 100.0 fL   MCH 32.5  26.0 - 34.0 pg   MCHC 35.3  30.0 - 36.0 g/dL   RDW 82.9  56.2 - 13.0 %   Platelets 291  150 - 400 K/uL   Neutrophils Relative 38 (*) 43 - 77 %   Neutro Abs 2.6  1.7 - 7.7 K/uL   Lymphocytes Relative 43  12 - 46 %   Lymphs Abs 2.9  0.7 - 4.0 K/uL   Monocytes Relative 6  3 - 12 %   Monocytes Absolute 0.4  0.1 - 1.0 K/uL   Eosinophils Relative 11 (*) 0 - 5 %   Eosinophils Absolute 0.7  0.0 - 0.7 K/uL   Basophils Relative 2 (*) 0 - 1 %   Basophils Absolute 0.1  0.0 - 0.1 K/uL  COMPREHENSIVE METABOLIC PANEL      Component Value Range   Sodium 134 (*) 135 - 145 mEq/L   Potassium 3.7  3.5 - 5.1 mEq/L   Chloride 97  96 - 112 mEq/L   CO2 25  19 - 32 mEq/L   Glucose, Bld 71  70 - 99 mg/dL   BUN 15  6 - 23 mg/dL   Creatinine, Ser 8.65  0.50 - 1.10 mg/dL   Calcium 9.7  8.4 - 78.4 mg/dL   Total Protein 7.1  6.0 - 8.3 g/dL   Albumin 3.9  3.5 - 5.2 g/dL   AST 18  0 - 37 U/L   ALT 10  0 - 35 U/L   Alkaline Phosphatase 53  39 - 117 U/L   Total Bilirubin 0.2 (*) 0.3 - 1.2 mg/dL   GFR calc non Af Amer >90  >90 mL/min   GFR calc Af Amer >90  >90 mL/min  LIPASE, BLOOD      Component Value Range   Lipase 92 (*) 11 - 59 U/L  URINALYSIS, ROUTINE W REFLEX MICROSCOPIC      Component Value Range   Color, Urine YELLOW  YELLOW   APPearance CLEAR  CLEAR   Specific Gravity, Urine 1.011  1.005 - 1.030   pH 5.5  5.0 - 8.0   Glucose, UA NEGATIVE  NEGATIVE mg/dL   Hgb urine dipstick NEGATIVE  NEGATIVE   Bilirubin Urine NEGATIVE  NEGATIVE   Ketones, ur NEGATIVE  NEGATIVE mg/dL   Protein, ur NEGATIVE  NEGATIVE mg/dL   Urobilinogen, UA 0.2  0.0 - 1.0 mg/dL   Nitrite NEGATIVE  NEGATIVE   Leukocytes, UA NEGATIVE  NEGATIVE  WET  PREP, GENITAL      Component Value Range   Yeast Wet Prep HPF POC NONE SEEN  NONE SEEN   Trich, Wet Prep NONE SEEN  NONE SEEN   Clue Cells Wet Prep HPF POC MANY (*) NONE SEEN   WBC, Wet Prep HPF POC RARE (*) NONE SEEN  GC/CHLAMYDIA PROBE AMP, GENITAL      Component Value Range   GC Probe Amp, Genital NEGATIVE  NEGATIVE   Chlamydia, DNA Probe NEGATIVE  NEGATIVE   Ct Abdomen Pelvis W Contrast  05/24/2012  *RADIOLOGY REPORT*  Clinical Data: Abdominal pain and  constipation for 1 week.  Nausea. Elevated lipase.  CT ABDOMEN AND PELVIS WITH CONTRAST  Technique:  Multidetector CT imaging of the abdomen and pelvis was performed following the standard protocol during bolus administration of intravenous contrast.  Contrast: OMNIPAQUE IOHEXOL 300 MG/ML  SOLN  Comparison: 06/06/2006  Findings: Fibrosis in the lung bases.  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  Focal thickening of the wall of the distal esophagus could represent reflux or esophagitis.  Sub centimeter cysts in the liver, stable since previous study. Normal spleen size.  The gallbladder, pancreas, adrenal glands, kidneys, abdominal aorta, and retroperitoneal lymph nodes are unremarkable.  The stomach, small bowel, and colon are not abnormally distended.  Diffusely stool filled colon.  No free air or free fluid in the abdomen.  Pelvis:  The uterus is surgically absent.  No abnormal adnexal masses.  No free or loculated pelvic fluid collections.  The descending and sigmoid colon are decompressed.  Colon wall thickening the is suggested.  This could be due to incomplete distension or colitis.  No pericolonic infiltration.  No diverticulitis.  Appendix is not visualized.  The calcified phleboliths.  Degenerative changes in the lumbar spine.  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 Esophageal wall thickening distally may represent reflux or esophagitis.  Thick walled descending and sigmoid  colon may be due to under distension or colitis.  Original Report Authenticated By: Marlon Pel, M.D.   Dg Abd Acute W/chest  05/24/2012  *RADIOLOGY REPORT*  Clinical Data: Abdominal pain  ACUTE ABDOMEN SERIES (ABDOMEN 2 VIEW & CHEST 1 VIEW)  Comparison: 02/17/2011  Findings: Normal heart size and vascularity.  Background COPD/emphysema evident.  No focal pneumonia, edema, collapse, consolidation, effusion, pneumothorax. Streaky right base scarring versus atelectasis.  Trachea midline.  No free air evident.  Scattered air and stool throughout the bowel.  Negative for obstruction or ileus. Abdominal and pelvic calcifications noted, suspect phleboliths.  No acute osseous finding.  IMPRESSION: COPD/emphysema.  Negative for obstruction or free air  Original Report Authenticated By: Judie Petit. Ruel Favors, M.D.   Medical screening examination/treatment/procedure(s) were conducted as a shared visit with non-physician practitioner(s) and myself.  I personally evaluated the patient during the encounter``  Sunnie Nielsen, MD 05/26/12 [phone]

## 2012-06-10 ENCOUNTER — Ambulatory Visit: Payer: Medicare Other | Admitting: Physical Therapy

## 2012-07-17 ENCOUNTER — Emergency Department (HOSPITAL_COMMUNITY): Payer: Medicare Other

## 2012-07-17 ENCOUNTER — Observation Stay (HOSPITAL_COMMUNITY)
Admission: EM | Admit: 2012-07-17 | Discharge: 2012-07-18 | Disposition: A | Discharge status: Home / Self Care | Payer: Medicare Other | Attending: Emergency Medicine | Admitting: Emergency Medicine

## 2012-07-17 ENCOUNTER — Encounter (HOSPITAL_COMMUNITY): Payer: Self-pay | Admitting: Adult Health

## 2012-07-17 DIAGNOSIS — R079 Chest pain, unspecified: Principal | ICD-10-CM | POA: Insufficient documentation

## 2012-07-17 DIAGNOSIS — R0602 Shortness of breath: Secondary | ICD-10-CM | POA: Insufficient documentation

## 2012-07-17 DIAGNOSIS — F17200 Nicotine dependence, unspecified, uncomplicated: Secondary | ICD-10-CM | POA: Insufficient documentation

## 2012-07-17 DIAGNOSIS — G4730 Sleep apnea, unspecified: Secondary | ICD-10-CM | POA: Insufficient documentation

## 2012-07-17 DIAGNOSIS — IMO0001 Reserved for inherently not codable concepts without codable children: Secondary | ICD-10-CM | POA: Insufficient documentation

## 2012-07-17 DIAGNOSIS — R002 Palpitations: Secondary | ICD-10-CM | POA: Insufficient documentation

## 2012-07-17 DIAGNOSIS — J449 Chronic obstructive pulmonary disease, unspecified: Secondary | ICD-10-CM | POA: Insufficient documentation

## 2012-07-17 DIAGNOSIS — M79609 Pain in unspecified limb: Secondary | ICD-10-CM | POA: Insufficient documentation

## 2012-07-17 DIAGNOSIS — R112 Nausea with vomiting, unspecified: Secondary | ICD-10-CM | POA: Insufficient documentation

## 2012-07-17 DIAGNOSIS — F1410 Cocaine abuse, uncomplicated: Secondary | ICD-10-CM | POA: Insufficient documentation

## 2012-07-17 DIAGNOSIS — I10 Essential (primary) hypertension: Secondary | ICD-10-CM | POA: Insufficient documentation

## 2012-07-17 DIAGNOSIS — J4489 Other specified chronic obstructive pulmonary disease: Secondary | ICD-10-CM | POA: Insufficient documentation

## 2012-07-17 DIAGNOSIS — F319 Bipolar disorder, unspecified: Secondary | ICD-10-CM | POA: Insufficient documentation

## 2012-07-17 HISTORY — DX: Cocaine abuse, uncomplicated: F14.10

## 2012-07-17 LAB — CBC
HCT: 39.3 % (ref 36.0–46.0)
Hemoglobin: 13.7 g/dL (ref 12.0–15.0)
MCH: 32.2 pg (ref 26.0–34.0)
MCHC: 34.9 g/dL (ref 30.0–36.0)

## 2012-07-17 LAB — RAPID URINE DRUG SCREEN, HOSP PERFORMED
Amphetamines: NOT DETECTED
Benzodiazepines: POSITIVE — AB
Cocaine: POSITIVE — AB
Opiates: NOT DETECTED
Tetrahydrocannabinol: NOT DETECTED

## 2012-07-17 LAB — BASIC METABOLIC PANEL
BUN: 9 mg/dL (ref 6–23)
CO2: 25 mEq/L (ref 19–32)
GFR calc non Af Amer: 72 mL/min — ABNORMAL LOW (ref >90)
Glucose, Bld: 103 mg/dL — ABNORMAL HIGH (ref 70–99)
Potassium: 3 mEq/L — ABNORMAL LOW (ref 3.5–5.1)

## 2012-07-17 MED ORDER — NITROGLYCERIN 0.4 MG SL SUBL
0.4000 mg | SUBLINGUAL_TABLET | SUBLINGUAL | Status: DC | PRN
  Administered 2012-07-17 – 2012-07-18 (×2): 0.4 mg via SUBLINGUAL
  Filled 2012-07-17: qty 75
  Filled 2012-07-17: qty 25

## 2012-07-17 NOTE — ED Notes (Signed)
Pt resting at this time.  Vss.

## 2012-07-17 NOTE — ED Notes (Signed)
Iv started.  Pt a/o states that she had some cocaine last night and has had chest pain since taking that.

## 2012-07-17 NOTE — ED Notes (Signed)
C/o lying down and feeling heart flutter , constant pain in chest that "feels like someone is pulling my heart out of my chest" cocaine use last night at 9 pm. Nausea and vomiting this am with chest pain located in center of chest associated with SOB, weakness and bilateral upper and lower extremitiy pain.  heart pain radiates to left shoulder.

## 2012-07-17 NOTE — ED Provider Notes (Signed)
History     CSN: [id number]  Arrival date & time 07/17/12  1805   None     Chief Complaint  Patient presents with  . Chest Pain    (Consider location/radiation/quality/duration/timing/severity/associated sxs/prior treatment) Patient is a 61 y.o. female presenting with chest pain. The history is provided by the patient.  Chest Pain The chest pain began 12 - 24 hours ago. Chest pain occurs constantly. The chest pain is unchanged. Associated with: cocaine use. The severity of the pain is moderate. The quality of the pain is described as tightness. The pain radiates to the left arm. Chest pain is worsened by smoking. Primary symptoms include shortness of breath, palpitations and vomiting (this morning). Pertinent negatives for primary symptoms include no fever, no fatigue, no syncope, no cough, no wheezing, no abdominal pain, no nausea, no dizziness and no altered mental status.  The palpitations also occurred with shortness of breath. The palpitations did not occur with dizziness.  She tried aspirin for the symptoms.     Past Medical History  Diagnosis Date  . COPD (chronic obstructive pulmonary disease)   . Hypertension   . Fibromyalgia   . Osteoarthritis   . Bipolar 1 disorder   . Glaucoma   . Sleep apnea   . Cocaine abuse     Past Surgical History  Procedure Date  . Abdominal hysterectomy   . Cesarean section   . Eye surgery   . Revision of scar tissue rectus muscle   . Uvulopalatopharyngoplasty     History reviewed. No pertinent family history.  History  Substance Use Topics  . Smoking status: Current Some Day Smoker  . Smokeless tobacco: Not on file  . Alcohol Use: Yes    OB History    Grav Para Term Preterm Abortions TAB SAB Ect Mult Living                  Review of Systems  Constitutional: Negative for fever and fatigue.  Respiratory: Positive for shortness of breath. Negative for cough and wheezing.   Cardiovascular: Positive for chest pain and  palpitations. Negative for syncope.  Gastrointestinal: Positive for vomiting (this morning). Negative for nausea, abdominal pain and diarrhea.  Neurological: Negative for dizziness and headaches.  Psychiatric/Behavioral: Negative for altered mental status.  All other systems reviewed and are negative.    Allergies  Codeine; Meloxicam; and Imipramine  Home Medications   Current Outpatient Rx  Name Route Sig Dispense Refill  . ALBUTEROL SULFATE HFA 108 (90 BASE) MCG/ACT IN AERS Inhalation Inhale 2 puffs into the lungs every 6 (six) hours as needed.    Marland Kitchen CLONAZEPAM 1 MG PO TABS Oral Take 1 mg by mouth 3 (three) times daily as needed. For anxiety    . DOCUSATE SODIUM 100 MG PO CAPS Oral Take 100 mg by mouth 4 (four) times daily as needed. For constipation    . ESCITALOPRAM OXALATE 20 MG PO TABS Oral Take 20 mg by mouth daily.      Marland Kitchen HYDROXYZINE PAMOATE 25 MG PO CAPS Oral Take 25 mg by mouth 3 (three) times daily as needed. For anxiety    . LISINOPRIL-HYDROCHLOROTHIAZIDE 20-12.5 MG PO TABS Oral Take 1 tablet by mouth daily.    Marland Kitchen OMEPRAZOLE 20 MG PO CPDR Oral Take 20 mg by mouth daily.      . DISPOSABLE ENEMA 19-7 GM/118ML RE ENEM Rectal Place 1 enema rectally once. follow package directions    . TIOTROPIUM BROMIDE MONOHYDRATE 18 MCG IN  CAPS Inhalation Place 18 mcg into inhaler and inhale daily.      . TRAMADOL HCL 50 MG PO TABS Oral Take 50 mg by mouth every 8 (eight) hours as needed. For pain    . ZIPRASIDONE HCL 20 MG PO CAPS Oral Take 20 mg by mouth at bedtime.      BP 132/72  Pulse 78  Temp 98.4 F (36.9 C) (Oral)  Resp 18  SpO2 98%  Physical Exam  Nursing note and vitals reviewed. Constitutional: She is oriented to person, place, and time. She appears well-developed and well-nourished. No distress.  HENT:  Head: Normocephalic and atraumatic.  Eyes: EOM are normal. Pupils are equal, round, and reactive to light.  Neck: Normal range of motion.  Cardiovascular: Normal rate and  normal heart sounds.   Pulmonary/Chest: Effort normal and breath sounds normal. No respiratory distress.  Abdominal: Soft. She exhibits no distension. There is no tenderness.  Musculoskeletal: Normal range of motion.  Neurological: She is alert and oriented to person, place, and time. No cranial nerve deficit. She exhibits normal muscle tone. Coordination normal.  Skin: Skin is warm and dry.    ED Course  Procedures (including critical care time)  Labs Reviewed  BASIC METABOLIC PANEL - Abnormal; Notable for the following:    Potassium 3.0 (*)     Glucose, Bld 103 (*)     GFR calc non Af Amer 72 (*)     GFR calc Af Amer 83 (*)     All other components within normal limits  URINE RAPID DRUG SCREEN (HOSP PERFORMED) - Abnormal; Notable for the following:    Cocaine POSITIVE (*)     Benzodiazepines POSITIVE (*)     All other components within normal limits  CBC  POCT I-STAT TROPONIN I  CK  POCT I-STAT TROPONIN I   Dg Chest 2 View  07/17/2012  *RADIOLOGY REPORT*  Clinical Data: Shortness of breath and coughing.  CHEST - 2 VIEW  Comparison: 02/17/2011  Findings: Hyperexpansion with slight chronic interstitial coarsening suggests emphysema.  No edema or focal airspace consolidation.  No pleural effusion or pneumothorax. The cardiopericardial silhouette is within normal limits for size. Imaged bony structures of the thorax are intact.  IMPRESSION: Stable.  Emphysema without acute findings.   Original Report Authenticated By: ERIC A. MANSELL, M.D.     Date: 07/18/2012  Rate: 77  Rhythm: normal sinus rhythm  QRS Axis: normal  Intervals: QT prolonged  ST/T Wave abnormalities: normal  Conduction Disutrbances:none  Narrative Interpretation:   Old EKG Reviewed: unchanged    1. Chest pain       MDM  7:04 PM Pt seen and examined. Pt with chest pain after using cocaine last night. The pain started about three hours after her last hit. She states that she tried taking several ASA without  any change in her pain level. She also tried breathing treatments which helped with her breathing but did not help with the pain. Pt states she has a history of unstable angina (also mentioned in visit in 2008). Will start ACS workup and also include CK.   10:33 PM Pt had complete resolution of her CP with NTG. Will place in CDU for chest pain obs.      Daleen Bo, MD 07/18/12 [phone]

## 2012-07-17 NOTE — ED Notes (Signed)
Report given to cdu nurse.  Pt transported via stretcher.

## 2012-07-18 ENCOUNTER — Observation Stay (HOSPITAL_COMMUNITY): Payer: Medicare Other

## 2012-07-18 LAB — POCT I-STAT TROPONIN I: Troponin i, poc: 0 ng/mL (ref 0.00–0.08)

## 2012-07-18 MED ORDER — IOHEXOL 350 MG/ML SOLN
100.0000 mL | Freq: Once | INTRAVENOUS | Status: AC | PRN
  Administered 2012-07-18: 100 mL via INTRAVENOUS

## 2012-07-18 MED ORDER — ALBUTEROL SULFATE HFA 108 (90 BASE) MCG/ACT IN AERS
2.0000 | INHALATION_SPRAY | RESPIRATORY_TRACT | Status: DC | PRN
  Administered 2012-07-18: 2 via RESPIRATORY_TRACT
  Filled 2012-07-18: qty 6.7

## 2012-07-18 MED ORDER — ACETAMINOPHEN 325 MG PO TABS
650.0000 mg | ORAL_TABLET | Freq: Once | ORAL | Status: AC
  Administered 2012-07-18: 650 mg via ORAL
  Filled 2012-07-18: qty 2

## 2012-07-18 MED ORDER — METOPROLOL TARTRATE 1 MG/ML IV SOLN
2.5000 mg | Freq: Once | INTRAVENOUS | Status: AC
  Administered 2012-07-18: 2.5 mg via INTRAVENOUS
  Filled 2012-07-18: qty 5

## 2012-07-18 MED ORDER — SODIUM CHLORIDE 0.9 % IV BOLUS (SEPSIS)
1000.0000 mL | Freq: Once | INTRAVENOUS | Status: AC
  Administered 2012-07-18: 1000 mL via INTRAVENOUS

## 2012-07-18 MED ORDER — METOPROLOL TARTRATE 1 MG/ML IV SOLN
INTRAVENOUS | Status: AC
  Filled 2012-07-18: qty 5

## 2012-07-18 NOTE — ED Notes (Signed)
Patient resting quietly, calm with unlabored respirations.  

## 2012-07-18 NOTE — Progress Notes (Signed)
Utilization review completed.  

## 2012-07-18 NOTE — ED Notes (Signed)
Patient taken to Coronary CT.

## 2012-07-18 NOTE — ED Notes (Signed)
Spoke with Dr. Ty Hilts, radiology and pharmacist - okay to give lopressor 2.5 mg IV initially and then an additional lopressor 5.0 mg, if needed for heart rate control during coronary CT.

## 2012-07-18 NOTE — ED Provider Notes (Signed)
I saw and evaluated the patient, reviewed the resident's note and I agree with the findings and plan.   Loren Racer, MD 07/18/12 1535

## 2012-07-18 NOTE — ED Notes (Signed)
Patient ambulated to restroom. Patient tolerated well.

## 2012-07-18 NOTE — ED Provider Notes (Signed)
7:00 AM Assumed care of patient in the CDU.  Patient is currently on Chest Pain Protocol and will have a CT of her coronary arteries this morning.  Patient presented last evening with chest pain.  She admitted to using cocaine 2 days ago.  She states that she developed the chest pain 3 hours after her last hit of cocaine.  Chest pain is substernal and radiated to her left arm.  Chest pain associated with palpitations, shortness of breath, and vomiting.  No acute findings on EKG.  Initial, 3 hour, and 6 hour troponin were all negative.  She denies chest pain at this time.   Patient alert and orientated x 3 Heart:  RRR Lungs:  CTAB Extremities:  No peripheral edema.  DP pulse 2+ bilaterally.  Patient awaiting CT coronary arteries.  10:36 AM Patient is currently getting the CT of her coronary arteries performed.  11:48 AM Results of the CT coronary as follows: IMPRESSION:  1. No atherosclerotic coronary artery disease.  2. Total coronary artery calcium score is zero, which is zero  percentile for patient's matched age and gender.  3. Right coronary artery dominance.  Therefore, will discharge the patient home.  Educated patient on the dangers of cocaine use.  Return precautions discussed with the patient.     Pascal Lux [place], PA-C 07/18/12 1922

## 2012-07-23 NOTE — ED Provider Notes (Signed)
Medical screening examination/treatment/procedure(s) were performed by non-physician practitioner and as supervising physician I was immediately available for consultation/collaboration.   Gavin Pound. Oletta Lamas, MD 07/23/12 [phone]

## 2012-08-08 ENCOUNTER — Emergency Department (HOSPITAL_COMMUNITY)
Admission: EM | Admit: 2012-08-08 | Discharge: 2012-08-08 | Disposition: A | Discharge status: Home / Self Care | Payer: Medicare Other | Attending: Emergency Medicine | Admitting: Emergency Medicine

## 2012-08-08 ENCOUNTER — Emergency Department (HOSPITAL_COMMUNITY): Payer: Medicare Other

## 2012-08-08 ENCOUNTER — Encounter (HOSPITAL_COMMUNITY): Payer: Self-pay | Admitting: *Deleted

## 2012-08-08 DIAGNOSIS — M1990 Unspecified osteoarthritis, unspecified site: Secondary | ICD-10-CM | POA: Insufficient documentation

## 2012-08-08 DIAGNOSIS — R296 Repeated falls: Secondary | ICD-10-CM | POA: Insufficient documentation

## 2012-08-08 DIAGNOSIS — J449 Chronic obstructive pulmonary disease, unspecified: Secondary | ICD-10-CM | POA: Insufficient documentation

## 2012-08-08 DIAGNOSIS — F17200 Nicotine dependence, unspecified, uncomplicated: Secondary | ICD-10-CM | POA: Insufficient documentation

## 2012-08-08 DIAGNOSIS — S42409A Unspecified fracture of lower end of unspecified humerus, initial encounter for closed fracture: Secondary | ICD-10-CM

## 2012-08-08 DIAGNOSIS — W19XXXA Unspecified fall, initial encounter: Secondary | ICD-10-CM

## 2012-08-08 DIAGNOSIS — I10 Essential (primary) hypertension: Secondary | ICD-10-CM | POA: Insufficient documentation

## 2012-08-08 DIAGNOSIS — H409 Unspecified glaucoma: Secondary | ICD-10-CM | POA: Insufficient documentation

## 2012-08-08 DIAGNOSIS — F1910 Other psychoactive substance abuse, uncomplicated: Secondary | ICD-10-CM | POA: Insufficient documentation

## 2012-08-08 DIAGNOSIS — Z7982 Long term (current) use of aspirin: Secondary | ICD-10-CM | POA: Insufficient documentation

## 2012-08-08 DIAGNOSIS — Y939 Activity, unspecified: Secondary | ICD-10-CM | POA: Insufficient documentation

## 2012-08-08 DIAGNOSIS — F319 Bipolar disorder, unspecified: Secondary | ICD-10-CM | POA: Insufficient documentation

## 2012-08-08 DIAGNOSIS — S52123A Displaced fracture of head of unspecified radius, initial encounter for closed fracture: Secondary | ICD-10-CM | POA: Insufficient documentation

## 2012-08-08 DIAGNOSIS — Z79899 Other long term (current) drug therapy: Secondary | ICD-10-CM | POA: Insufficient documentation

## 2012-08-08 DIAGNOSIS — Y9289 Other specified places as the place of occurrence of the external cause: Secondary | ICD-10-CM | POA: Insufficient documentation

## 2012-08-08 DIAGNOSIS — R5381 Other malaise: Secondary | ICD-10-CM | POA: Insufficient documentation

## 2012-08-08 DIAGNOSIS — F1410 Cocaine abuse, uncomplicated: Secondary | ICD-10-CM | POA: Insufficient documentation

## 2012-08-08 DIAGNOSIS — J4489 Other specified chronic obstructive pulmonary disease: Secondary | ICD-10-CM | POA: Insufficient documentation

## 2012-08-08 LAB — POCT I-STAT, CHEM 8
BUN: 12 mg/dL (ref 6–23)
Creatinine, Ser: 1.1 mg/dL (ref 0.50–1.10)
Glucose, Bld: 98 mg/dL (ref 70–99)
Sodium: 143 mEq/L (ref 135–145)
TCO2: 21 mmol/L (ref 0–100)

## 2012-08-08 LAB — CBC
HCT: 40.9 % (ref 36.0–46.0)
Hemoglobin: 14.5 g/dL (ref 12.0–15.0)
MCH: 32.4 pg (ref 26.0–34.0)
MCHC: 35.5 g/dL (ref 30.0–36.0)
MCV: 91.5 fL (ref 78.0–100.0)

## 2012-08-08 LAB — POCT I-STAT TROPONIN I

## 2012-08-08 MED ORDER — HYDROCODONE-ACETAMINOPHEN 5-325 MG PO TABS
1.0000 | ORAL_TABLET | Freq: Once | ORAL | Status: AC
  Administered 2012-08-08: 1 via ORAL
  Filled 2012-08-08 (×2): qty 1

## 2012-08-08 MED ORDER — TRAMADOL HCL 50 MG PO TABS: 50.0000 mg | ORAL_TABLET | Freq: Four times a day (QID) | ORAL | Status: DC | PRN

## 2012-08-08 MED ORDER — ACETAMINOPHEN 325 MG PO TABS
650.0000 mg | ORAL_TABLET | Freq: Once | ORAL | Status: DC
  Filled 2012-08-08: qty 2

## 2012-08-08 NOTE — ED Notes (Addendum)
Pt st's she is not suicidal, this was not an intentional overdose.

## 2012-08-08 NOTE — Progress Notes (Signed)
Orthopedic Tech Progress Note Patient Details:  Cassie Harrell 1951/08/06 [id number]  Ortho Devices Type of Ortho Device: Ulna gutter splint   Haskell Flirt 08/08/2012, 4:28 AM

## 2012-08-08 NOTE — ED Provider Notes (Addendum)
History     CSN: [id number]  Arrival date & time 08/08/12  0123   First MD Initiated Contact with Patient 08/08/12 0124      No chief complaint on file.   (Consider location/radiation/quality/duration/timing/severity/associated sxs/prior treatment) Patient is a 61 y.o. female presenting with fall.  Fall The accident occurred 1 to 2 hours ago. The fall occurred while walking. She fell from a height of 1 to 2 ft. She landed on a hard floor. The point of impact was the left elbow. The pain is present in the head and left elbow. The pain is at a severity of 5/10. The pain is mild. She was not ambulatory at the scene. There was no drug use involved in the accident.    Past Medical History  Diagnosis Date  . COPD (chronic obstructive pulmonary disease)   . Hypertension   . Fibromyalgia   . Osteoarthritis   . Bipolar 1 disorder   . Glaucoma(365)   . Sleep apnea   . Cocaine abuse     Past Surgical History  Procedure Date  . Abdominal hysterectomy   . Cesarean section   . Eye surgery   . Revision of scar tissue rectus muscle   . Uvulopalatopharyngoplasty     No family history on file.  History  Substance Use Topics  . Smoking status: Current Some Day Smoker  . Smokeless tobacco: Not on file  . Alcohol Use: Yes    OB History    Grav Para Term Preterm Abortions TAB SAB Ect Mult Living                  Review of Systems  Musculoskeletal: Positive for joint swelling.  All other systems reviewed and are negative.    Allergies  Codeine; Meloxicam; and Imipramine  Home Medications   Current Outpatient Rx  Name Route Sig Dispense Refill  . ALBUTEROL SULFATE HFA 108 (90 BASE) MCG/ACT IN AERS Inhalation Inhale 2 puffs into the lungs every 6 (six) hours as needed. For wheezing    . ASPIRIN BUFFERED 325 MG PO TABS Oral Take 325 mg by mouth daily as needed. angina    . CLONAZEPAM 1 MG PO TABS Oral Take 1 mg by mouth 3 (three) times daily as needed. For anxiety    .  ESCITALOPRAM OXALATE 20 MG PO TABS Oral Take 20 mg by mouth daily.      Marland Kitchen HYDROCODONE-ACETAMINOPHEN 5-500 MG PO TABS Oral Take 1 tablet by mouth every 6 (six) hours as needed. For pain    . HYDROXYZINE PAMOATE 25 MG PO CAPS Oral Take 25 mg by mouth 3 (three) times daily as needed. For itching    . LISINOPRIL-HYDROCHLOROTHIAZIDE 20-12.5 MG PO TABS Oral Take 1 tablet by mouth daily.    Marland Kitchen NITROGLYCERIN 0.4 MG SL SUBL Sublingual Place 0.4 mg under the tongue every 5 (five) minutes as needed. For chest pain    . OMEPRAZOLE 20 MG PO CPDR Oral Take 20 mg by mouth daily.      Marland Kitchen TIOTROPIUM BROMIDE MONOHYDRATE 18 MCG IN CAPS Inhalation Place 18 mcg into inhaler and inhale daily.      Marland Kitchen ZIPRASIDONE HCL 20 MG PO CAPS Oral Take 20 mg by mouth at bedtime.      BP 102/59  Pulse 79  Temp 98.2 F (36.8 C) (Oral)  Resp 16  SpO2 97%  Physical Exam  Constitutional: She is oriented to person, place, and time. She appears well-developed and well-nourished.  HENT:  Head: Normocephalic and atraumatic.  Eyes: Conjunctivae normal and EOM are normal. Pupils are equal, round, and reactive to light.  Neck: Normal range of motion.  Cardiovascular: Normal rate, regular rhythm and normal heart sounds.   Pulmonary/Chest: Effort normal and breath sounds normal.  Abdominal: Soft. Bowel sounds are normal.  Musculoskeletal:       Pain at left elbow  Neurological: She is alert and oriented to person, place, and time.  Skin: Skin is warm and dry.  Psychiatric: She has a normal mood and affect. Her behavior is normal.    ED Course  Procedures (including critical care time)  Labs Reviewed  ETHANOL - Abnormal; Notable for the following:    Alcohol, Ethyl (B) 77 (*)     All other components within normal limits  POCT I-STAT, CHEM 8 - Abnormal; Notable for the following:    Hemoglobin 15.6 (*)     All other components within normal limits  CBC  POCT I-STAT TROPONIN I   Dg Chest 1 View  08/08/2012  *RADIOLOGY  REPORT*  Clinical Data: Left-sided chest pain  CHEST - 1 VIEW  Comparison: 07/17/2012  Findings: Coarse interstitial markings.  Hypoaerated with interstitial and vascular crowding.  Heart size upper normal to mildly enlarged.  Mediastinal contours otherwise within normal range.  No pleural effusion or pneumothorax identified.  No displaced fracture identified within limitations of a single view.  IMPRESSION: Chronic interstitial markings.  No acute process identified.   Original Report Authenticated By: Waneta Martins, M.D.    Dg Elbow Complete Left  08/08/2012  *RADIOLOGY REPORT*  Clinical Data: Elbow pain  LEFT ELBOW - COMPLETE 3+ VIEW  Comparison: None.  Findings: There may be a nondisplaced radial head fracture.  A small joint effusion is suggested.  No dislocation.  No aggressive osseous lesion.  IMPRESSION: Concern for a nondisplaced radial head fracture.  Small joint effusion.   Original Report Authenticated By: Waneta Martins, M.D.    Ct Head Wo Contrast  08/08/2012  *RADIOLOGY REPORT*  Clinical Data: Fall, head trauma.  CT HEAD WITHOUT CONTRAST  Technique:  Contiguous axial images were obtained from the base of the skull through the vertex without contrast.  Comparison: 06/09/2010  Findings: At that the There is no evidence for acute hemorrhage, hydrocephalus, mass lesion, or abnormal extra-axial fluid collection.  No definite CT evidence for acute infarction.  The visualized paranasal sinuses and mastoid air cells are predominately clear.  No displaced calvarial fracture.  IMPRESSION: No acute intracranial abnormality.   Original Report Authenticated By: Waneta Martins, M.D.    Dg Humerus Left  08/08/2012  *RADIOLOGY REPORT*  Clinical Data: Left upper arm pain status post fall.  LEFT HUMERUS - 2+ VIEW  Comparison: Contemporaneous elbow  Findings: The left humeral shaft is intact.  Limited evaluation the joint spaces without gross abnormality.  IMPRESSION: No acute osseous  abnormality of the left humerus.   Original Report Authenticated By: Waneta Martins, M.D.      1. Polysubstance abuse   2. Fall       MDM  Pt states drank etoh and smoked crack.  States had mechanical fall over vacuum.  Xray with ? Radial head fracture.  Will splint.  Improved.  Will dc to [facility] outpt,  Ret new/worsening sxs        Fatisha Rabalais Lytle Michaels, MD 08/08/12 0417  Kamerin Grumbine Lytle Michaels, MD 08/08/12 4540  Rosanne Ashing, MD 10/30/12 [phone]

## 2012-08-08 NOTE — ED Notes (Signed)
Pt returned from radiology via stretcher at this time.

## 2012-08-08 NOTE — ED Notes (Signed)
Pt refused discharge vital signs

## 2012-08-08 NOTE — ED Notes (Signed)
Pt to radiology at this time.

## 2012-08-08 NOTE — ED Notes (Addendum)
Per EMS:  Pt had cocaine tonight around 5 hours ago (clean for 2-3 years).  In an attempt to relax due to pain in her arm pt took 3 xanax, and drank alcohol (this was about 1.5 hours ago).  Pt st's she was just trying to relax.  Pt and syncopal episode fell of the vacuum, complaining of pain in L shoulder and L ribs.  Pt st's she heard something pop and pt is unable to supinate arm.  Pt conscious, alert, oriented, no SOB, lungs clear and equal.

## 2012-12-06 ENCOUNTER — Other Ambulatory Visit (HOSPITAL_COMMUNITY): Payer: Self-pay | Admitting: Internal Medicine

## 2012-12-06 DIAGNOSIS — Z1231 Encounter for screening mammogram for malignant neoplasm of breast: Secondary | ICD-10-CM

## 2012-12-13 ENCOUNTER — Ambulatory Visit (HOSPITAL_COMMUNITY): Payer: Medicare Other | Attending: Internal Medicine

## 2013-01-08 ENCOUNTER — Other Ambulatory Visit (HOSPITAL_COMMUNITY): Payer: Self-pay | Admitting: Internal Medicine

## 2013-01-08 DIAGNOSIS — Z1231 Encounter for screening mammogram for malignant neoplasm of breast: Secondary | ICD-10-CM

## 2013-01-28 ENCOUNTER — Ambulatory Visit (HOSPITAL_COMMUNITY)
Admission: RE | Admit: 2013-01-28 | Discharge: 2013-01-28 | Disposition: A | Discharge status: Home / Self Care | Payer: Medicare Other | Source: Ambulatory Visit | Attending: Internal Medicine | Admitting: Internal Medicine

## 2013-01-28 DIAGNOSIS — Z1231 Encounter for screening mammogram for malignant neoplasm of breast: Secondary | ICD-10-CM | POA: Insufficient documentation

## 2013-04-12 ENCOUNTER — Emergency Department (HOSPITAL_COMMUNITY): Payer: Medicare Other

## 2013-04-12 ENCOUNTER — Encounter (HOSPITAL_COMMUNITY): Payer: Self-pay

## 2013-04-12 ENCOUNTER — Emergency Department (HOSPITAL_COMMUNITY)
Admission: EM | Admit: 2013-04-12 | Discharge: 2013-04-12 | Disposition: A | Discharge status: Home / Self Care | Payer: Medicare Other | Attending: Emergency Medicine | Admitting: Emergency Medicine

## 2013-04-12 DIAGNOSIS — R05 Cough: Secondary | ICD-10-CM | POA: Insufficient documentation

## 2013-04-12 DIAGNOSIS — IMO0001 Reserved for inherently not codable concepts without codable children: Secondary | ICD-10-CM | POA: Insufficient documentation

## 2013-04-12 DIAGNOSIS — R059 Cough, unspecified: Secondary | ICD-10-CM | POA: Insufficient documentation

## 2013-04-12 DIAGNOSIS — H409 Unspecified glaucoma: Secondary | ICD-10-CM

## 2013-04-12 DIAGNOSIS — I10 Essential (primary) hypertension: Secondary | ICD-10-CM | POA: Insufficient documentation

## 2013-04-12 DIAGNOSIS — J449 Chronic obstructive pulmonary disease, unspecified: Secondary | ICD-10-CM

## 2013-04-12 DIAGNOSIS — G4730 Sleep apnea, unspecified: Secondary | ICD-10-CM | POA: Insufficient documentation

## 2013-04-12 DIAGNOSIS — R0989 Other specified symptoms and signs involving the circulatory and respiratory systems: Secondary | ICD-10-CM

## 2013-04-12 DIAGNOSIS — F319 Bipolar disorder, unspecified: Secondary | ICD-10-CM | POA: Insufficient documentation

## 2013-04-12 DIAGNOSIS — R6889 Other general symptoms and signs: Secondary | ICD-10-CM | POA: Insufficient documentation

## 2013-04-12 DIAGNOSIS — J4489 Other specified chronic obstructive pulmonary disease: Secondary | ICD-10-CM | POA: Insufficient documentation

## 2013-04-12 DIAGNOSIS — R070 Pain in throat: Secondary | ICD-10-CM | POA: Insufficient documentation

## 2013-04-12 DIAGNOSIS — Z79899 Other long term (current) drug therapy: Secondary | ICD-10-CM | POA: Insufficient documentation

## 2013-04-12 DIAGNOSIS — F17200 Nicotine dependence, unspecified, uncomplicated: Secondary | ICD-10-CM | POA: Insufficient documentation

## 2013-04-12 DIAGNOSIS — M1990 Unspecified osteoarthritis, unspecified site: Secondary | ICD-10-CM | POA: Insufficient documentation

## 2013-04-12 DIAGNOSIS — J029 Acute pharyngitis, unspecified: Secondary | ICD-10-CM | POA: Insufficient documentation

## 2013-04-12 DIAGNOSIS — F1410 Cocaine abuse, uncomplicated: Secondary | ICD-10-CM | POA: Insufficient documentation

## 2013-04-12 NOTE — ED Provider Notes (Signed)
History    CSN: [id number] Arrival date & time 04/12/13  0757  First MD Initiated Contact with Patient 04/12/13 [phone]     Chief Complaint  Patient presents with  . Swallowed Foreign Body   (Consider location/radiation/quality/duration/timing/severity/associated sxs/prior Treatment) The history is provided by the patient. No language interpreter was used.  Cassie Harrell is a 62 y/o F with PMhx of COPD, HTN, Fibromyalgia, OA, Bipolar disorder, Glaucoma, sleep apnea presenting to the ED with sensation that a chicken bone is stuck in her throat. Patient reported that she was eating chicken wings around 9:30PM and believed she swallowed a small piece of bone, shortly after patient reported that she felt "something stuck" in her throat approximately 30 minutes after eating the chicken wing. Reported that when she presses on her throat she feels pain like "something is stuck in there" and discomfort after she swallows. Stated that she couldn't sleep last night due to coughing. Stated that she has been drinking a lot of water to see if the "bone" would pass. Denied gasping for air, chest pain, shortness of breath, difficulty breathing, respiratory distress, drooling, difficulty controlling saliva, inability to swallow, neck pain, pain with neck motion, neck stiffness, changes to voice.  PCP: Dr. Fleet Contras   Past Medical History  Diagnosis Date  . COPD (chronic obstructive pulmonary disease)   . Hypertension   . Fibromyalgia   . Osteoarthritis   . Bipolar 1 disorder   . Glaucoma   . Sleep apnea   . Cocaine abuse    Past Surgical History  Procedure Laterality Date  . Abdominal hysterectomy    . Cesarean section    . Eye surgery    . Revision of scar tissue rectus muscle    . Uvulopalatopharyngoplasty     No family history on file. History  Substance Use Topics  . Smoking status: Current Some Day Smoker  . Smokeless tobacco: Not on file  . Alcohol Use: Yes   OB History   Grav Para Term Preterm Abortions TAB SAB Ect Mult Living                 Review of Systems  Constitutional: Negative for fever and chills.  HENT: Positive for sore throat. Negative for trouble swallowing, neck pain and neck stiffness.   Eyes: Negative for pain.  Respiratory: Positive for cough. Negative for chest tightness and shortness of breath.   Cardiovascular: Negative for chest pain.  Gastrointestinal: Negative for vomiting and abdominal pain.  Neurological: Negative for dizziness, weakness, light-headedness, numbness and headaches.  All other systems reviewed and are negative.    Allergies  Codeine; Meloxicam; and Imipramine  Home Medications   Current Outpatient Rx  Name  Route  Sig  Dispense  Refill  . albuterol (PROVENTIL HFA;VENTOLIN HFA) 108 (90 BASE) MCG/ACT inhaler   Inhalation   Inhale 2 puffs into the lungs every 6 (six) hours as needed. For wheezing         . clonazePAM (KLONOPIN) 1 MG tablet   Oral   Take 1 mg by mouth 3 (three) times daily as needed for anxiety.          Marland Kitchen escitalopram (LEXAPRO) 20 MG tablet   Oral   Take 20 mg by mouth daily.           Marland Kitchen lisinopril-hydrochlorothiazide (PRINZIDE,ZESTORETIC) 20-12.5 MG per tablet   Oral   Take 1 tablet by mouth daily.         . nitroGLYCERIN (  NITROSTAT) 0.4 MG SL tablet   Sublingual   Place 0.4 mg under the tongue every 5 (five) minutes as needed for chest pain.          Marland Kitchen omeprazole (PRILOSEC) 20 MG capsule   Oral   Take 20 mg by mouth daily.           Marland Kitchen tiotropium (SPIRIVA) 18 MCG inhalation capsule   Inhalation   Place 18 mcg into inhaler and inhale daily.           . traMADol (ULTRAM) 50 MG tablet   Oral   Take 50 mg by mouth every 6 (six) hours as needed for pain.         . ziprasidone (GEODON) 20 MG capsule   Oral   Take 20 mg by mouth at bedtime.          BP 100/62  Pulse 76  Temp(Src) 98.1 F (36.7 C) (Oral)  Resp 16  SpO2 98% Physical Exam  Nursing note  and vitals reviewed. Constitutional: She is oriented to person, place, and time. She appears well-developed and well-nourished. No distress.  No cyanosis noted Negative respiratory distress  HENT:  Head: Normocephalic and atraumatic.  Mouth/Throat: Oropharynx is clear and moist. No oropharyngeal exudate.  Eyes: Conjunctivae and EOM are normal. Pupils are equal, round, and reactive to light. Right eye exhibits no discharge. Left eye exhibits no discharge.  Neck: Normal range of motion. Neck supple. No tracheal deviation present.  Full ROM to the neck without pain or discomfort Mild discomfort upon palpation to the anterior portion of neck - discomfort with pressure applied near Adam's apple  Cardiovascular: Normal rate, regular rhythm and normal heart sounds.  Exam reveals no friction rub.   No murmur heard. Pulses:      Radial pulses are 2+ on the right side, and 2+ on the left side.  Cap refill < 3 seconds bilaterally  Pulmonary/Chest: Effort normal and breath sounds normal. No respiratory distress. She has no wheezes. She has no rales.  Lymphadenopathy:    She has no cervical adenopathy.  Neurological: She is alert and oriented to person, place, and time. No cranial nerve deficit. She exhibits normal muscle tone. Coordination normal.  Cranial nerves III-XII grossly intact  Skin: Skin is warm and dry. No rash noted. She is not diaphoretic. No erythema.  Psychiatric: She has a normal mood and affect. Her behavior is normal. Thought content normal.    ED Course  Procedures (including critical care time) Labs Reviewed - No data to display Dg Neck Soft Tissue  04/12/2013   *RADIOLOGY REPORT*  Clinical Data: Foreign body sensation.  The patient may have swallowed a chicken bone.  NECK SOFT TISSUES - 1+ VIEW  Comparison: Cervical spine radiographs 04/24/2007.  Findings: A linear calcification posterior to the thyroid cartilage is stable from prior exam.  No other radiopaque foreign body is  evident.  The supraglottic and tracheal airway is patent. Degenerative changes of the cervical spine demonstrate to mild interval progression, particularly at C4-5 and C5-6.  IMPRESSION:  1.  No new radiopaque foreign body. 2.  Progressive degenerative changes in the mid cervical spine, particularly at C4-5 and C5-6.   Original Report Authenticated By: Marin Roberts, M.D.   Dg Chest 2 View  04/12/2013   **ADDENDUM** CREATED: 04/12/2013 10:33:25  No radiopaque foreign bodies are observed.  **END ADDENDUM** SIGNED BY: Venetia Night. Cloutier, M.D.  04/12/2013   *RADIOLOGY REPORT*  Clinical Data: COPD., sleep apnea.  CHEST - 2 VIEW  Comparison: 08/08/2012.  Findings: The heart, mediastinum and hilar contours are normal. The lungs are clear and fully expanded.  No effusions or pneumothoraces.  No acute bony changes.  IMPRESSION: No active disease.   Original Report Authenticated By: Sander Radon, M.D.   1. Throat pain   2. Foreign body sensation in throat   3. COPD (chronic obstructive pulmonary disease)   4. HTN (hypertension)   5. OA (osteoarthritis)   6. Glaucoma     MDM  Patient presenting to ED with throat discomfort and discomfort after swallowing due to eating chicken wing last night and believing bone stuck in throat. Patient is in no respiratory distress, no cyanosis noted, negative gasping for air. Cap refill < 3 seconds. Lungs clear to auscultation. Negative change to voice noted. Patient able to control saliva, swallow saliva, negative drooling - doubt esophageal food bolus. Imaging reviewed by me, negative foreign bodies noted. Patient able to swallow water - observed patient drink water from straw - no difficulty noted. Throat discomfort suspicion to be due to possible scratch from swallowing a chicken bone, possible reminents - definitive etiology unknown. Patient stable, afebrile. No respiratory distress, no uncontrollable saliva, no drooling. Patient appears comfortable. Discussed  and reviewed case with Dr. Jinger Neighbors - cleared patient for discharge. Discharged patient with referral for ENT and PCP. Discussed with patient to rest and stay hydrated. Discussed with patient to continue to monitor symptoms and if symptoms are to worsen or change to report back to the ED - strict return instructions given.  Patient agreed to plan of care, understood, all questions answered.   Raymon Mutton, PA-C 04/12/13 1804

## 2013-04-12 NOTE — ED Notes (Signed)
Pt states she was eating chicken wings last night and swallowed a bit of bone.  Pt states she is uncertain if the bone is still in her throat or if it scratched her throat on the way down.  Pt c/o sore throat.  EMS reports that upon palpation, throat feels the same.

## 2013-04-13 NOTE — ED Provider Notes (Signed)
Medical screening examination/treatment/procedure(s) were performed by non-physician practitioner and as supervising physician I was immediately available for consultation/collaboration.  Hurman Horn, MD 04/13/13 2117

## 2013-04-20 ENCOUNTER — Encounter (HOSPITAL_COMMUNITY): Payer: Self-pay | Admitting: Family Medicine

## 2013-04-20 ENCOUNTER — Emergency Department (HOSPITAL_COMMUNITY): Payer: Medicare Other

## 2013-04-20 ENCOUNTER — Emergency Department (HOSPITAL_COMMUNITY)
Admission: EM | Admit: 2013-04-20 | Discharge: 2013-04-20 | Disposition: A | Discharge status: Home / Self Care | Payer: Medicare Other | Attending: Emergency Medicine | Admitting: Emergency Medicine

## 2013-04-20 DIAGNOSIS — G4730 Sleep apnea, unspecified: Secondary | ICD-10-CM | POA: Insufficient documentation

## 2013-04-20 DIAGNOSIS — Z8669 Personal history of other diseases of the nervous system and sense organs: Secondary | ICD-10-CM | POA: Insufficient documentation

## 2013-04-20 DIAGNOSIS — F17200 Nicotine dependence, unspecified, uncomplicated: Secondary | ICD-10-CM | POA: Insufficient documentation

## 2013-04-20 DIAGNOSIS — IMO0001 Reserved for inherently not codable concepts without codable children: Secondary | ICD-10-CM | POA: Insufficient documentation

## 2013-04-20 DIAGNOSIS — M1990 Unspecified osteoarthritis, unspecified site: Secondary | ICD-10-CM | POA: Insufficient documentation

## 2013-04-20 DIAGNOSIS — S8000XA Contusion of unspecified knee, initial encounter: Secondary | ICD-10-CM | POA: Insufficient documentation

## 2013-04-20 DIAGNOSIS — J449 Chronic obstructive pulmonary disease, unspecified: Secondary | ICD-10-CM | POA: Insufficient documentation

## 2013-04-20 DIAGNOSIS — F1410 Cocaine abuse, uncomplicated: Secondary | ICD-10-CM | POA: Insufficient documentation

## 2013-04-20 DIAGNOSIS — W19XXXA Unspecified fall, initial encounter: Secondary | ICD-10-CM | POA: Insufficient documentation

## 2013-04-20 DIAGNOSIS — J4489 Other specified chronic obstructive pulmonary disease: Secondary | ICD-10-CM | POA: Insufficient documentation

## 2013-04-20 DIAGNOSIS — I10 Essential (primary) hypertension: Secondary | ICD-10-CM | POA: Insufficient documentation

## 2013-04-20 DIAGNOSIS — F319 Bipolar disorder, unspecified: Secondary | ICD-10-CM | POA: Insufficient documentation

## 2013-04-20 DIAGNOSIS — S8002XA Contusion of left knee, initial encounter: Secondary | ICD-10-CM

## 2013-04-20 DIAGNOSIS — Y939 Activity, unspecified: Secondary | ICD-10-CM | POA: Insufficient documentation

## 2013-04-20 DIAGNOSIS — Z79899 Other long term (current) drug therapy: Secondary | ICD-10-CM | POA: Insufficient documentation

## 2013-04-20 DIAGNOSIS — Y929 Unspecified place or not applicable: Secondary | ICD-10-CM | POA: Insufficient documentation

## 2013-04-20 MED ORDER — OXYCODONE-ACETAMINOPHEN 5-325 MG PO TABS: 2.0000 | ORAL_TABLET | Freq: Four times a day (QID) | ORAL | Status: DC | PRN

## 2013-04-20 MED ORDER — OXYCODONE-ACETAMINOPHEN 5-325 MG PO TABS
1.0000 | ORAL_TABLET | Freq: Once | ORAL | Status: AC
  Administered 2013-04-20: 1 via ORAL
  Filled 2013-04-20: qty 1

## 2013-04-20 MED ORDER — OXYCODONE-ACETAMINOPHEN 5-325 MG PO TABS: 1.0000 | ORAL_TABLET | Freq: Four times a day (QID) | ORAL | Status: DC | PRN

## 2013-04-20 NOTE — ED Notes (Signed)
Ortho Paged.  

## 2013-04-20 NOTE — ED Notes (Addendum)
Pt had fall this am and complaining of bilateral leg pain. Abrasion and swelling noted. Pt not complaining of any neck or back pain. Denies LOC or hitting head.

## 2013-04-20 NOTE — ED Provider Notes (Signed)
History    CSN: [id number] Arrival date & time 04/20/13  0347  First MD Initiated Contact with Patient 04/20/13 1012     Chief Complaint  Patient presents with  . Fall   (Consider location/radiation/quality/duration/timing/severity/associated sxs/prior Treatment) Patient is a 62 y.o. female presenting with fall. The history is provided by the patient.  Fall This is a new problem. The current episode started today. The problem has been gradually worsening. Associated symptoms include myalgias. Pertinent negatives include no abdominal pain, chest pain, chills, coughing, fatigue, fever, headaches, nausea, neck pain, numbness, vomiting or weakness. The symptoms are aggravated by standing and walking. She has tried nothing for the symptoms.   Past Medical History  Diagnosis Date  . COPD (chronic obstructive pulmonary disease)   . Hypertension   . Fibromyalgia   . Osteoarthritis   . Bipolar 1 disorder   . Glaucoma   . Sleep apnea   . Cocaine abuse    Past Surgical History  Procedure Laterality Date  . Abdominal hysterectomy    . Cesarean section    . Eye surgery    . Revision of scar tissue rectus muscle    . Uvulopalatopharyngoplasty     History reviewed. No pertinent family history. History  Substance Use Topics  . Smoking status: Current Some Day Smoker  . Smokeless tobacco: Not on file  . Alcohol Use: Yes   OB History   Grav Para Term Preterm Abortions TAB SAB Ect Mult Living                 Review of Systems  Constitutional: Negative for fever, chills and fatigue.  HENT: Negative for neck pain.   Respiratory: Negative for cough and shortness of breath.   Cardiovascular: Negative for chest pain.  Gastrointestinal: Negative for nausea, vomiting and abdominal pain.  Musculoskeletal: Positive for myalgias.  Neurological: Negative for weakness, numbness and headaches.  All other systems reviewed and are negative.    Allergies  Codeine; Meloxicam; and  Imipramine  Home Medications   Current Outpatient Rx  Name  Route  Sig  Dispense  Refill  . albuterol (PROVENTIL HFA;VENTOLIN HFA) 108 (90 BASE) MCG/ACT inhaler   Inhalation   Inhale 2 puffs into the lungs every 6 (six) hours as needed. For wheezing         . clonazePAM (KLONOPIN) 1 MG tablet   Oral   Take 1 mg by mouth 3 (three) times daily as needed for anxiety.          . cyclobenzaprine (FLEXERIL) 10 MG tablet   Oral   Take 10 mg by mouth at bedtime as needed for muscle spasms.         Marland Kitchen escitalopram (LEXAPRO) 20 MG tablet   Oral   Take 20 mg by mouth daily.           Marland Kitchen lisinopril-hydrochlorothiazide (PRINZIDE,ZESTORETIC) 20-12.5 MG per tablet   Oral   Take 1 tablet by mouth daily.         . naproxen (NAPROSYN) 500 MG tablet   Oral   Take 500 mg by mouth 2 (two) times daily as needed (Inflammation and pain). Take with food         . omeprazole (PRILOSEC) 20 MG capsule   Oral   Take 20 mg by mouth daily.           Marland Kitchen tiotropium (SPIRIVA) 18 MCG inhalation capsule   Inhalation   Place 18 mcg into inhaler and inhale daily.           Marland Kitchen  traMADol (ULTRAM) 50 MG tablet   Oral   Take 50 mg by mouth every 6 (six) hours as needed for pain.         . ziprasidone (GEODON) 20 MG capsule   Oral   Take 20 mg by mouth at bedtime.         . nitroGLYCERIN (NITROSTAT) 0.4 MG SL tablet   Sublingual   Place 0.4 mg under the tongue every 5 (five) minutes as needed for chest pain.          Marland Kitchen oxyCODONE-acetaminophen (PERCOCET) 5-325 MG per tablet   Oral   Take 1 tablet by mouth every 6 (six) hours as needed for pain.   10 tablet   0    BP 125/82  Pulse 67  Temp(Src) 97.9 F (36.6 C) (Oral)  Resp 16  SpO2 97% Physical Exam  Constitutional: She is oriented to person, place, and time. She appears well-developed and well-nourished. No distress.  HENT:  Head: Normocephalic and atraumatic.  Eyes: EOM are normal. Pupils are equal, round, and reactive to  light.  Neck: Neck supple.  Cardiovascular: Normal rate, regular rhythm, normal heart sounds and intact distal pulses.   Pulmonary/Chest: Effort normal and breath sounds normal. No respiratory distress. She has no wheezes.  Abdominal: Soft. Bowel sounds are normal.  Musculoskeletal:       Right knee: She exhibits decreased range of motion. She exhibits no swelling, no effusion, no ecchymosis, no deformity, no laceration, no erythema and normal alignment.       Left knee: Tenderness found.       Legs: There is a small area of tenderness to touch of the right leg inferior to the knee.     Neurological: She is alert and oriented to person, place, and time.  Skin: Skin is warm and dry. She is not diaphoretic.    ED Course  Procedures (including critical care time)  DG Tibia/Fibula Right (Final result)  Result time: 04/20/13 11:06:03    Final result by Rad Results In Interface (04/20/13 11:06:03)    Narrative:   *RADIOLOGY REPORT*  Clinical Data: Fall and right lower leg pain.  RIGHT TIBIA AND FIBULA - 2 VIEW  Comparison: Right knee 04/20/2013  Findings: Two views of the right lower leg were obtained. The tibia and fibula are intact. Negative for fracture or dislocation.  IMPRESSION: No acute bony abnormality.   Original Report Authenticated By: Richarda Overlie, M.D.             DG Knee Complete 4 Views Left (Final result)  Result time: 04/20/13 11:04:43    Procedure changed from [facility] Knee Complete 4 Views Right       Final result by Rad Results In Interface (04/20/13 11:04:43)    Narrative:   *RADIOLOGY REPORT*  Clinical Data: Fall and knee pain.  LEFT KNEE - COMPLETE 4+ VIEW  Comparison: None.  Findings: The left knee is located without acute fracture or dislocation. No definite joint effusion. Normal alignment of the left knee.  IMPRESSION: No acute bony abnormality.     1. Knee contusion, left, initial encounter     MDM  Patient's XR showed no acute  bony abnormality.  Patient was sent home with crutches and a knee sleeve.    Boykin Peek, MD 04/28/13 2051

## 2013-04-28 NOTE — ED Provider Notes (Signed)
I saw and evaluated the patient, reviewed the resident's note and I agree with the findings and plan. Patient with a mechanical fall downstairs with persistent left knee pain. Unable to put weight on the knee and no significant tenderness over the proximal medial portion of the left knee. Also some mild tenderness to the right upper leg. Plain films are negative for acute fracture patient was given crutches and pain control and given followup with ortho as needed  Gwyneth Sprout, MD 04/28/13 2229

## 2013-11-04 ENCOUNTER — Other Ambulatory Visit (HOSPITAL_COMMUNITY): Payer: Self-pay | Admitting: Podiatry

## 2013-11-13 ENCOUNTER — Other Ambulatory Visit: Payer: Self-pay | Admitting: Internal Medicine

## 2013-11-13 DIAGNOSIS — E2839 Other primary ovarian failure: Secondary | ICD-10-CM

## 2013-12-04 ENCOUNTER — Emergency Department (HOSPITAL_COMMUNITY): Payer: Medicare Other

## 2013-12-04 ENCOUNTER — Encounter (HOSPITAL_COMMUNITY): Payer: Self-pay | Admitting: Emergency Medicine

## 2013-12-04 ENCOUNTER — Emergency Department (HOSPITAL_COMMUNITY)
Admission: EM | Admit: 2013-12-04 | Discharge: 2013-12-04 | Disposition: A | Discharge status: Home / Self Care | Payer: Medicare Other | Attending: Emergency Medicine | Admitting: Emergency Medicine

## 2013-12-04 DIAGNOSIS — S79919A Unspecified injury of unspecified hip, initial encounter: Secondary | ICD-10-CM | POA: Insufficient documentation

## 2013-12-04 DIAGNOSIS — I10 Essential (primary) hypertension: Secondary | ICD-10-CM | POA: Insufficient documentation

## 2013-12-04 DIAGNOSIS — J449 Chronic obstructive pulmonary disease, unspecified: Secondary | ICD-10-CM | POA: Insufficient documentation

## 2013-12-04 DIAGNOSIS — Z79899 Other long term (current) drug therapy: Secondary | ICD-10-CM | POA: Insufficient documentation

## 2013-12-04 DIAGNOSIS — IMO0002 Reserved for concepts with insufficient information to code with codable children: Secondary | ICD-10-CM | POA: Insufficient documentation

## 2013-12-04 DIAGNOSIS — F17200 Nicotine dependence, unspecified, uncomplicated: Secondary | ICD-10-CM | POA: Insufficient documentation

## 2013-12-04 DIAGNOSIS — M7918 Myalgia, other site: Secondary | ICD-10-CM

## 2013-12-04 DIAGNOSIS — M25552 Pain in left hip: Secondary | ICD-10-CM

## 2013-12-04 DIAGNOSIS — W009XXA Unspecified fall due to ice and snow, initial encounter: Secondary | ICD-10-CM

## 2013-12-04 DIAGNOSIS — W010XXA Fall on same level from slipping, tripping and stumbling without subsequent striking against object, initial encounter: Secondary | ICD-10-CM | POA: Insufficient documentation

## 2013-12-04 DIAGNOSIS — Y9289 Other specified places as the place of occurrence of the external cause: Secondary | ICD-10-CM | POA: Insufficient documentation

## 2013-12-04 DIAGNOSIS — M1990 Unspecified osteoarthritis, unspecified site: Secondary | ICD-10-CM | POA: Insufficient documentation

## 2013-12-04 DIAGNOSIS — S79929A Unspecified injury of unspecified thigh, initial encounter: Secondary | ICD-10-CM

## 2013-12-04 DIAGNOSIS — IMO0001 Reserved for inherently not codable concepts without codable children: Secondary | ICD-10-CM | POA: Insufficient documentation

## 2013-12-04 DIAGNOSIS — F319 Bipolar disorder, unspecified: Secondary | ICD-10-CM | POA: Insufficient documentation

## 2013-12-04 DIAGNOSIS — J4489 Other specified chronic obstructive pulmonary disease: Secondary | ICD-10-CM | POA: Insufficient documentation

## 2013-12-04 DIAGNOSIS — Y9301 Activity, walking, marching and hiking: Secondary | ICD-10-CM | POA: Insufficient documentation

## 2013-12-04 DIAGNOSIS — H409 Unspecified glaucoma: Secondary | ICD-10-CM | POA: Insufficient documentation

## 2013-12-04 MED ORDER — HYDROCODONE-ACETAMINOPHEN 5-325 MG PO TABS: ORAL_TABLET | ORAL | Status: DC

## 2013-12-04 MED ORDER — MORPHINE SULFATE 4 MG/ML IJ SOLN
4.0000 mg | Freq: Once | INTRAMUSCULAR | Status: AC
  Administered 2013-12-04: 4 mg via INTRAMUSCULAR
  Filled 2013-12-04: qty 1

## 2013-12-04 NOTE — ED Provider Notes (Signed)
CSN: [id number]     Arrival date & time 12/04/13  1903 History   First MD Initiated Contact with Patient 12/04/13 2052     Chief Complaint  Patient presents with  . Fall     (Consider location/radiation/quality/duration/timing/severity/associated sxs/prior Treatment) HPI Pt is a 63yo female with hx of fibromyalgia and osteoarthritis c/o lower back, buttock, and left hip pain after slipping on ice and falling onto back around 12pm today. Pt states she was walking to her mailbox when incident occurred. Reports hitting her head but denies LOC. States she did not start to feel pain until about 3 or 4pm this afternoon. Pain has progressively worsened since then in her coccyx.  Pain is aching, throbbing, radiating into left leg, 8/10. Sitting, standing, and walking make pain worse. She took 800mg  ibuprofen w/o relief.  Reports hx of OA, however denies previous hx of lower back or left hip pain prior to incident. Denies numbness or tingling in legs or groin. No change in bowel or bladder habits.   Past Medical History  Diagnosis Date  . COPD (chronic obstructive pulmonary disease)   . Hypertension   . Fibromyalgia   . Osteoarthritis   . Bipolar 1 disorder   . Glaucoma   . Sleep apnea   . Cocaine abuse    Past Surgical History  Procedure Laterality Date  . Abdominal hysterectomy    . Cesarean section    . Eye surgery    . Revision of scar tissue rectus muscle    . Uvulopalatopharyngoplasty     No family history on file. History  Substance Use Topics  . Smoking status: Current Some Day Smoker    Types: Cigarettes  . Smokeless tobacco: Not on file  . Alcohol Use: Yes   OB History   Grav Para Term Preterm Abortions TAB SAB Ect Mult Living                 Review of Systems  Musculoskeletal: Positive for arthralgias ( left hip), back pain ( left lower back) and myalgias. Negative for neck pain and neck stiffness.  Neurological: Negative for syncope, weakness, numbness and headaches.   All other systems reviewed and are negative.      Allergies  Codeine; Meloxicam; and Imipramine  Home Medications   Current Outpatient Rx  Name  Route  Sig  Dispense  Refill  . albuterol (PROVENTIL HFA;VENTOLIN HFA) 108 (90 BASE) MCG/ACT inhaler   Inhalation   Inhale 2 puffs into the lungs every 6 (six) hours as needed. For wheezing         . brimonidine-timolol (COMBIGAN) 0.2-0.5 % ophthalmic solution   Both Eyes   Place 1 drop into both eyes every morning.         . clonazePAM (KLONOPIN) 1 MG tablet   Oral   Take 1 mg by mouth 2 (two) times daily as needed for anxiety.          . cyclobenzaprine (FLEXERIL) 10 MG tablet   Oral   Take 10 mg by mouth at bedtime as needed for muscle spasms.         Marland Kitchen escitalopram (LEXAPRO) 20 MG tablet   Oral   Take 20 mg by mouth every evening.          Marland Kitchen HYDROcodone-acetaminophen (NORCO) 7.5-325 MG per tablet   Oral   Take 1 tablet by mouth every 4 (four) hours as needed for moderate pain.         Marland Kitchen  latanoprost (XALATAN) 0.005 % ophthalmic solution   Both Eyes   Place 2 drops into both eyes at bedtime.         Marland Kitchen lisinopril-hydrochlorothiazide (PRINZIDE,ZESTORETIC) 20-12.5 MG per tablet   Oral   Take 1 tablet by mouth daily.         . naproxen (NAPROSYN) 500 MG tablet   Oral   Take 500 mg by mouth 2 (two) times daily as needed (Inflammation and pain). Take with food         . nitroGLYCERIN (NITROSTAT) 0.4 MG SL tablet   Sublingual   Place 0.4 mg under the tongue every 5 (five) minutes as needed for chest pain.          Marland Kitchen omeprazole (PRILOSEC) 20 MG capsule   Oral   Take 40 mg by mouth every morning.          . tiotropium (SPIRIVA) 18 MCG inhalation capsule   Inhalation   Place 18 mcg into inhaler and inhale daily.           . ziprasidone (GEODON) 20 MG capsule   Oral   Take 20 mg by mouth at bedtime.         Marland Kitchen HYDROcodone-acetaminophen (NORCO/VICODIN) 5-325 MG per tablet      Take 1-2 pills  every 4-6 hours as needed for pain.   10 tablet   0    BP 127/96  Pulse 68  Temp(Src) 98.1 F (36.7 C) (Oral)  Resp 16  Ht 5\' 3"  (1.6 m)  Wt 138 lb 4.8 oz (62.732 kg)  BMI 24.50 kg/m2  SpO2 99% Physical Exam  Nursing note and vitals reviewed. Constitutional: She appears well-developed and well-nourished.  Pt lying on exam bed, appears mildly uncomfortable.  HENT:  Head: Normocephalic and atraumatic.  Eyes: Conjunctivae are normal. No scleral icterus.  Neck: Normal range of motion. Neck supple.  No midline bone tenderness, no crepitus or step-offs.   Cardiovascular: Normal rate, regular rhythm and normal heart sounds.   Pulmonary/Chest: Effort normal and breath sounds normal. No respiratory distress. She has no wheezes. She has no rales. She exhibits no tenderness.  Abdominal: Soft. Bowel sounds are normal. She exhibits no distension and no mass. There is no tenderness. There is no rebound and no guarding.  Musculoskeletal: Normal range of motion. She exhibits tenderness. She exhibits no edema.  Tenderness of left lumbar musculature, worse over left buttock and sacrum. No cervical, thoracic, or lumbar spinal tenderness, step offs or crepitus. Mild tenderness of left hip. FROM left hip w/o crepitus. Sensation to left leg in tact.   Neurological: She is alert.  Antalgic gait  Skin: Skin is warm and dry. No erythema.  Skin in tact. No erythema or ecchymosis.    ED Course  Procedures (including critical care time) Labs Review Labs Reviewed - No data to display Imaging Review Dg Lumbar Spine Complete  12/04/2013   CLINICAL DATA:  63 year old female status post fall with pain in the back and radiating to the left hip. Initial encounter.  EXAM: LUMBAR SPINE - COMPLETE 4+ VIEW  COMPARISON:  CT Abdomen and Pelvis 05/24/2012.  FINDINGS: Chronic mild levoconvex lumbar scoliosis. Otherwise normal lumbar vertebral height and alignment. Relatively preserved disc spaces. Normal lumbar  segmentation. No pars fracture. Sacral ala and SI joints appear within normal limits. Grossly intact visualized lower thoracic levels. Pelvic phleboliths.  IMPRESSION: No acute fracture or listhesis identified in the lumbar spine.   Electronically Signed   By: Nedra Hai  Margo AyeHall M.D.   On: 12/04/2013 22:17   Dg Hip Complete Left  12/04/2013   CLINICAL DATA:  63 year old female status post fall on ice with pain radiating to the left hip. Initial encounter.  EXAM: LEFT HIP - COMPLETE 2+ VIEW  COMPARISON:  None.  FINDINGS: Bone mineralization is within normal limits for age. Femoral heads normally located. Pelvis intact. Sacral ala and SI joints within normal limits. Proximal left femur appears intact. No acute fracture identified.  IMPRESSION: No acute fracture or dislocation identified about the left hip per pelvis.   Electronically Signed   By: Augusto GambleLee  Hall M.D.   On: 12/04/2013 22:16    EKG Interpretation   None       MDM   Final diagnoses:  Fall from slipping on ice  Buttock pain  Left hip pain    Low concern for hip fracture. Not concerned for cauda equina syndrome. Likely musculoskeletal pain as pt is tender to palpation, however no red flag symptoms, FROM left hip. Tx in ED: morphine IM.   Plain films lumbar spine and left hip: no acute fracture or listhesis in lumbar spine. No acute fracture or dislocation in left hip per pelvis.  Will tx pain with norco and discussed use of alternating ice and heat. Advised pt she will likely be sore for several days. Advised to f/u with her PCP. Return precautions provided. Pt verbalized understanding and agreement with tx plan.     Junius FinnerErin O'Malley, PA-C 12/05/13 [phone]

## 2013-12-04 NOTE — ED Notes (Signed)
Pt states she feel on some ice around noon, and started having pain around 1500.  Pt states the pain is going from her hip into her legs and her tailbone is "real sore."

## 2013-12-04 NOTE — ED Notes (Signed)
Pt returns from radiology. 

## 2013-12-12 NOTE — ED Provider Notes (Signed)
Medical screening examination/treatment/procedure(s) were performed by non-physician practitioner and as supervising physician I was immediately available for consultation/collaboration.  Amaani Guilbault Y. Elishua Radford, MD 12/12/13 1036 

## 2013-12-24 ENCOUNTER — Other Ambulatory Visit: Payer: Self-pay | Admitting: Internal Medicine

## 2014-01-07 ENCOUNTER — Ambulatory Visit (INDEPENDENT_AMBULATORY_CARE_PROVIDER_SITE_OTHER): Payer: Medicare Other | Admitting: Diagnostic Neuroimaging

## 2014-01-07 ENCOUNTER — Encounter (INDEPENDENT_AMBULATORY_CARE_PROVIDER_SITE_OTHER): Payer: Self-pay

## 2014-01-07 ENCOUNTER — Encounter: Payer: Self-pay | Admitting: Diagnostic Neuroimaging

## 2014-01-07 VITALS — BP 118/74 | HR 59 | Ht 63.0 in | Wt 137.0 lb

## 2014-01-07 DIAGNOSIS — R200 Anesthesia of skin: Secondary | ICD-10-CM

## 2014-01-07 DIAGNOSIS — M79609 Pain in unspecified limb: Secondary | ICD-10-CM

## 2014-01-07 DIAGNOSIS — M25552 Pain in left hip: Secondary | ICD-10-CM

## 2014-01-07 DIAGNOSIS — R209 Unspecified disturbances of skin sensation: Secondary | ICD-10-CM

## 2014-01-07 DIAGNOSIS — M545 Low back pain, unspecified: Secondary | ICD-10-CM

## 2014-01-07 DIAGNOSIS — M25559 Pain in unspecified hip: Secondary | ICD-10-CM

## 2014-01-07 NOTE — Progress Notes (Signed)
[facility]  PATIENT: Cassie SlotMargie Ree Harrell DOB: 05-Dec-1950  REFERRING CLINICIAN: Zeigler HISTORY FROM: patient  REASON FOR VISIT: new consult   HISTORICAL  CHIEF COMPLAINT:  Chief Complaint  Patient presents with  . Back Pain    HISTORY OF PRESENT ILLNESS:   63 year old left-handed female with hypertension, depression, fibromyalgia, bipolar disorder, here for evaluation of bilateral lower extremity pain.  For past 6 months patient has had sharp shooting pains in her bilateral feet. Started in left foot and then spread to the right. Symptoms worse in the morning. She feels a "burning, tearing" sensation on the bottom, top and needle part of bilateral feet. Symptoms are confined to below the ankle. Separately patient has had chronic left hip, left knee pain. Patient has tried hydrocodone without relief. Patient seen psychiatry, diagnosed with tenosynovitis and treated with steroid injections. She denies history of diabetes or thyroid disease.  REVIEW OF SYSTEMS: Full 14 system review of systems performed and notable only for spinning sensation skin moles chills eye pain glaucoma cough wheezing feeling cold easy bruising numbness and feet bipolar disorder distances activity racing thoughts allergy Raynaud's cramps achy muscle joint pain  ALLERGIES: Allergies  Allergen Reactions  . Codeine Hives and Nausea And Vomiting  . Meloxicam Itching  . Imipramine Rash    HOME MEDICATIONS: Outpatient Prescriptions Prior to Visit  Medication Sig Dispense Refill  . albuterol (PROVENTIL HFA;VENTOLIN HFA) 108 (90 BASE) MCG/ACT inhaler Inhale 2 puffs into the lungs every 6 (six) hours as needed. For wheezing      . brimonidine-timolol (COMBIGAN) 0.2-0.5 % ophthalmic solution Place 1 drop into both eyes every morning.      . clonazePAM (KLONOPIN) 1 MG tablet Take 1 mg by mouth 2 (two) times daily as needed for anxiety.       Marland Kitchen. escitalopram (LEXAPRO) 20 MG tablet Take 20 mg by  mouth every evening.       . latanoprost (XALATAN) 0.005 % ophthalmic solution Place 2 drops into both eyes at bedtime.      Marland Kitchen. lisinopril-hydrochlorothiazide (PRINZIDE,ZESTORETIC) 20-12.5 MG per tablet Take 1 tablet by mouth daily.      . naproxen (NAPROSYN) 500 MG tablet Take 500 mg by mouth 2 (two) times daily as needed (Inflammation and pain). Take with food      . omeprazole (PRILOSEC) 20 MG capsule Take 40 mg by mouth every morning.       . tiotropium (SPIRIVA) 18 MCG inhalation capsule Place 18 mcg into inhaler and inhale daily.        . ziprasidone (GEODON) 20 MG capsule Take 20 mg by mouth at bedtime.      . cyclobenzaprine (FLEXERIL) 10 MG tablet Take 10 mg by mouth at bedtime as needed for muscle spasms.      Marland Kitchen. HYDROcodone-acetaminophen (NORCO) 7.5-325 MG per tablet Take 1 tablet by mouth every 4 (four) hours as needed for moderate pain.      Marland Kitchen. HYDROcodone-acetaminophen (NORCO/VICODIN) 5-325 MG per tablet Take 1-2 pills every 4-6 hours as needed for pain.  10 tablet  0  . nitroGLYCERIN (NITROSTAT) 0.4 MG SL tablet Place 0.4 mg under the tongue every 5 (five) minutes as needed for chest pain.        No facility-administered medications prior to visit.    PAST MEDICAL HISTORY: Past Medical History  Diagnosis Date  . COPD (chronic obstructive pulmonary disease)   . Hypertension   . Fibromyalgia   . Osteoarthritis   . Bipolar 1  disorder   . Glaucoma   . Sleep apnea   . Cocaine abuse   . GERD (gastroesophageal reflux disease)     PAST SURGICAL HISTORY: Past Surgical History  Procedure Laterality Date  . Abdominal hysterectomy    . Cesarean section    . Eye surgery    . Revision of scar tissue rectus muscle    . Uvulopalatopharyngoplasty    . Appendectomy    . Finger surgery      FAMILY HISTORY: Family History  Problem Relation Age of Onset  . Other Mother     natural  . Heart attack Father     SOCIAL HISTORY:  History   Social History  . Marital Status:  Single    Spouse Name: N/A    Number of Children: 1  . Years of Education: College   Occupational History  .  Other    disabled   Social History Main Topics  . Smoking status: Current Every Day Smoker -- 0.50 packs/day for 20 years    Types: Cigarettes  . Smokeless tobacco: Never Used  . Alcohol Use: Yes     Comment: 1-2 cans of beer weekly  . Drug Use: No     Comment: within the last couple of week; quit 1 yr ago (2014)  . Sexual Activity: Not on file   Other Topics Concern  . Not on file   Social History Narrative   Patient lives at home alone.   Caffeine Use: quit 2 yrs ago     PHYSICAL EXAM  Filed Vitals:   01/07/14 1133  BP: 118/74  Pulse: 59  Height: 5\' 3"  (1.6 m)  Weight: 137 lb (62.143 kg)    Not recorded    Body mass index is 24.27 kg/(m^2).  GENERAL EXAM: Patient is in no distress; well developed, nourished and groomed; neck is supple; POSITIVE STRAIGHT LEG RAISE ON LEFT.   CARDIOVASCULAR: Regular rate and rhythm, no murmurs, no carotid bruits  NEUROLOGIC: MENTAL STATUS: awake, alert, oriented to person, place and time, recent and remote memory intact, normal attention and concentration, language fluent, comprehension intact, naming intact, fund of knowledge appropriate CRANIAL NERVE: no papilledema on fundoscopic exam, pupils equal and reactive to light, visual fields full to confrontation, extraocular muscles intact, no nystagmus, facial sensation and strength symmetric, hearing intact, palate elevates symmetrically, uvula midline, shoulder shrug symmetric, tongue midline. MOTOR: normal bulk and tone, full strength in the BUE, BLE SENSORY: normal and symmetric to light touch, pinprick, temperature, vibration; EXCEPT DEC PP IN FEET. COORDINATION: finger-nose-finger, fine finger movements normal REFLEXES: BUE TRACE, KNEES TRACE, ANKLES 0. MUTE TOES GAIT/STATION: ANTALGIC, SLOW, CAUTIOUS GAIT. RIGHT FOOT PAIN. UNSTEADY.     DIAGNOSTIC DATA (LABS,  IMAGING, TESTING) - I reviewed patient records, labs, notes, testing and imaging myself where available.  Lab Results  Component Value Date   WBC 8.6 08/08/2012   HGB 15.6* 08/08/2012   HCT 46.0 08/08/2012   MCV 91.5 08/08/2012   PLT 304 08/08/2012      Component Value Date/Time   NA 143 08/08/2012 0157   K 3.6 08/08/2012 0157   CL 106 08/08/2012 0157   CO2 25 07/17/2012 2040   GLUCOSE 98 08/08/2012 0157   BUN 12 08/08/2012 0157   CREATININE 1.10 08/08/2012 0157   CALCIUM 9.1 07/17/2012 2040   PROT 7.1 05/23/2012 2331   ALBUMIN 3.9 05/23/2012 2331   AST 18 05/23/2012 2331   ALT 10 05/23/2012 2331   ALKPHOS 53 05/23/2012  2331   BILITOT 0.2* 05/23/2012 2331   GFRNONAA 72* 07/17/2012 2040   GFRAA 83* 07/17/2012 2040   No results found for this basename: CHOL, HDL, LDLCALC, LDLDIRECT, TRIG, CHOLHDL   No results found for this basename: HGBA1C   No results found for this basename: VITAMINB12   No results found for this basename: TSH      ASSESSMENT AND PLAN  63 y.o. year old female here with bilateral foot numbness and pain for past 6 months. Also with left hip, left knee pain and low back pain. Could represent underlying peripheral neuropathy versus left lumbar radiculopathy. Will check testing as below. Then may need pain mgmt clinic vs surgical eval vs further workup.  PLAN: Orders Placed This Encounter  Procedures  . MR Lumbar Spine Wo Contrast  . Vitamin B12  . TSH  . Hemoglobin A1c  . NCV with EMG(electromyography)    Return for emg.    Suanne Marker, MD 01/07/2014, 12:33 PM Certified in Neurology, Neurophysiology and Neuroimaging  [facility] [address], [address] [place], [place] [address] [phone]

## 2014-01-07 NOTE — Patient Instructions (Signed)
I will check MRI, EMG and labs.

## 2014-01-08 LAB — HEMOGLOBIN A1C
ESTIMATED AVERAGE GLUCOSE: 103 mg/dL
HEMOGLOBIN A1C: 5.2 % (ref 4.8–5.6)

## 2014-01-08 LAB — VITAMIN B12: VITAMIN B 12: 970 pg/mL — AB (ref 211–946)

## 2014-01-08 LAB — TSH: TSH: 1.4 u[IU]/mL (ref 0.450–4.500)

## 2014-01-12 ENCOUNTER — Other Ambulatory Visit: Payer: Self-pay | Admitting: Internal Medicine

## 2014-01-12 DIAGNOSIS — N644 Mastodynia: Secondary | ICD-10-CM

## 2014-01-19 ENCOUNTER — Ambulatory Visit
Admission: RE | Admit: 2014-01-19 | Discharge: 2014-01-19 | Disposition: A | Discharge status: Home / Self Care | Payer: Medicare Other | Source: Ambulatory Visit | Attending: Diagnostic Neuroimaging | Admitting: Diagnostic Neuroimaging

## 2014-01-19 DIAGNOSIS — M79609 Pain in unspecified limb: Secondary | ICD-10-CM

## 2014-01-19 DIAGNOSIS — M25552 Pain in left hip: Secondary | ICD-10-CM

## 2014-01-19 DIAGNOSIS — M545 Low back pain, unspecified: Secondary | ICD-10-CM

## 2014-01-19 DIAGNOSIS — R209 Unspecified disturbances of skin sensation: Secondary | ICD-10-CM

## 2014-01-19 DIAGNOSIS — R200 Anesthesia of skin: Secondary | ICD-10-CM

## 2014-01-22 ENCOUNTER — Other Ambulatory Visit: Payer: Medicare Other

## 2014-01-26 ENCOUNTER — Ambulatory Visit (INDEPENDENT_AMBULATORY_CARE_PROVIDER_SITE_OTHER): Payer: Medicare Other | Admitting: Diagnostic Neuroimaging

## 2014-01-26 ENCOUNTER — Encounter (INDEPENDENT_AMBULATORY_CARE_PROVIDER_SITE_OTHER): Payer: Self-pay

## 2014-01-26 DIAGNOSIS — M545 Low back pain, unspecified: Secondary | ICD-10-CM

## 2014-01-26 DIAGNOSIS — M79609 Pain in unspecified limb: Secondary | ICD-10-CM

## 2014-01-26 DIAGNOSIS — R200 Anesthesia of skin: Secondary | ICD-10-CM

## 2014-01-26 DIAGNOSIS — R209 Unspecified disturbances of skin sensation: Secondary | ICD-10-CM

## 2014-01-26 DIAGNOSIS — M25552 Pain in left hip: Secondary | ICD-10-CM

## 2014-01-26 DIAGNOSIS — Z0289 Encounter for other administrative examinations: Secondary | ICD-10-CM

## 2014-01-26 NOTE — Procedures (Signed)
   [facility]  NCS (NERVE CONDUCTION STUDY) WITH EMG (ELECTROMYOGRAPHY) REPORT   STUDY DATE: 01/26/14 PATIENT NAME: Cassie Harrell DOB: September 04, 1951 MRN: [id number]  ORDERING CLINICIAN: Joycelyn SchmidVikram Denea Cheaney, MD   TECHNOLOGIST: Gearldine ShownLorraine Jones ELECTROMYOGRAPHER: Glenford BayleyVikram R. Normal Recinos, MD  CLINICAL INFORMATION: 63 year old female with painful feet.  FINDINGS: NERVE CONDUCTION STUDY: Bilateral peroneal and tibial motor responses and F-wave latencies are normal. Bilateral H reflex responses are normal. Bilateral peroneal sensory responses are normal.  NEEDLE ELECTROMYOGRAPHY: Needle examination of right lower extremity, vastus medialis, tibialis anterior, gastrocnemius and right L5-S1 paraspinal muscles is normal.  IMPRESSION:  Normal study. No electrodiagnostic evidence of large fiber neuropathy or lumbar radiculopathy at this time.   INTERPRETING PHYSICIAN:  Suanne MarkerVIKRAM R. Bertha Earwood, MD Certified in Neurology, Neurophysiology and Neuroimaging  [facility] [address], [address] [place], [place] [address] [phone]

## 2014-02-03 ENCOUNTER — Ambulatory Visit
Admission: RE | Admit: 2014-02-03 | Discharge: 2014-02-03 | Disposition: A | Discharge status: Home / Self Care | Payer: Medicare Other | Source: Ambulatory Visit | Attending: Internal Medicine | Admitting: Internal Medicine

## 2014-02-03 ENCOUNTER — Ambulatory Visit: Payer: Medicare Other

## 2014-02-03 ENCOUNTER — Encounter (INDEPENDENT_AMBULATORY_CARE_PROVIDER_SITE_OTHER): Payer: Self-pay

## 2014-02-03 DIAGNOSIS — N644 Mastodynia: Secondary | ICD-10-CM

## 2014-02-17 ENCOUNTER — Other Ambulatory Visit: Payer: Medicare Other

## 2014-02-27 ENCOUNTER — Other Ambulatory Visit: Payer: Medicare Other

## 2014-03-27 ENCOUNTER — Other Ambulatory Visit: Payer: Medicare Other

## 2014-04-03 ENCOUNTER — Ambulatory Visit
Admission: RE | Admit: 2014-04-03 | Discharge: 2014-04-03 | Disposition: A | Discharge status: Home / Self Care | Payer: Medicare Other | Source: Ambulatory Visit | Attending: Internal Medicine | Admitting: Internal Medicine

## 2014-04-03 ENCOUNTER — Encounter (INDEPENDENT_AMBULATORY_CARE_PROVIDER_SITE_OTHER): Payer: Self-pay

## 2014-04-03 DIAGNOSIS — E2839 Other primary ovarian failure: Secondary | ICD-10-CM

## 2014-06-08 ENCOUNTER — Emergency Department (HOSPITAL_COMMUNITY): Payer: Medicare Other

## 2014-06-08 ENCOUNTER — Inpatient Hospital Stay (HOSPITAL_COMMUNITY)
Admission: EM | Admit: 2014-06-08 | Discharge: 2014-06-10 | DRG: 194 | Disposition: A | Discharge status: Home / Self Care | Payer: Medicare Other | Attending: Internal Medicine | Admitting: Internal Medicine

## 2014-06-08 ENCOUNTER — Encounter (HOSPITAL_COMMUNITY): Payer: Self-pay | Admitting: Emergency Medicine

## 2014-06-08 DIAGNOSIS — F17200 Nicotine dependence, unspecified, uncomplicated: Secondary | ICD-10-CM | POA: Diagnosis present

## 2014-06-08 DIAGNOSIS — J209 Acute bronchitis, unspecified: Secondary | ICD-10-CM | POA: Diagnosis present

## 2014-06-08 DIAGNOSIS — K219 Gastro-esophageal reflux disease without esophagitis: Secondary | ICD-10-CM

## 2014-06-08 DIAGNOSIS — J449 Chronic obstructive pulmonary disease, unspecified: Secondary | ICD-10-CM | POA: Diagnosis present

## 2014-06-08 DIAGNOSIS — R0789 Other chest pain: Secondary | ICD-10-CM | POA: Diagnosis not present

## 2014-06-08 DIAGNOSIS — J440 Chronic obstructive pulmonary disease with acute lower respiratory infection: Secondary | ICD-10-CM | POA: Diagnosis present

## 2014-06-08 DIAGNOSIS — Z888 Allergy status to other drugs, medicaments and biological substances status: Secondary | ICD-10-CM

## 2014-06-08 DIAGNOSIS — F319 Bipolar disorder, unspecified: Secondary | ICD-10-CM

## 2014-06-08 DIAGNOSIS — M1990 Unspecified osteoarthritis, unspecified site: Secondary | ICD-10-CM | POA: Diagnosis present

## 2014-06-08 DIAGNOSIS — M797 Fibromyalgia: Secondary | ICD-10-CM

## 2014-06-08 DIAGNOSIS — G4730 Sleep apnea, unspecified: Secondary | ICD-10-CM | POA: Diagnosis present

## 2014-06-08 DIAGNOSIS — Z791 Long term (current) use of non-steroidal anti-inflammatories (NSAID): Secondary | ICD-10-CM

## 2014-06-08 DIAGNOSIS — J439 Emphysema, unspecified: Secondary | ICD-10-CM

## 2014-06-08 DIAGNOSIS — J189 Pneumonia, unspecified organism: Principal | ICD-10-CM

## 2014-06-08 DIAGNOSIS — F411 Generalized anxiety disorder: Secondary | ICD-10-CM | POA: Diagnosis present

## 2014-06-08 DIAGNOSIS — F1410 Cocaine abuse, uncomplicated: Secondary | ICD-10-CM | POA: Diagnosis present

## 2014-06-08 DIAGNOSIS — G608 Other hereditary and idiopathic neuropathies: Secondary | ICD-10-CM | POA: Diagnosis present

## 2014-06-08 DIAGNOSIS — IMO0001 Reserved for inherently not codable concepts without codable children: Secondary | ICD-10-CM | POA: Diagnosis present

## 2014-06-08 DIAGNOSIS — Z79899 Other long term (current) drug therapy: Secondary | ICD-10-CM

## 2014-06-08 DIAGNOSIS — IMO0002 Reserved for concepts with insufficient information to code with codable children: Secondary | ICD-10-CM

## 2014-06-08 DIAGNOSIS — I10 Essential (primary) hypertension: Secondary | ICD-10-CM | POA: Diagnosis present

## 2014-06-08 DIAGNOSIS — R079 Chest pain, unspecified: Secondary | ICD-10-CM

## 2014-06-08 DIAGNOSIS — H409 Unspecified glaucoma: Secondary | ICD-10-CM | POA: Diagnosis present

## 2014-06-08 DIAGNOSIS — J441 Chronic obstructive pulmonary disease with (acute) exacerbation: Secondary | ICD-10-CM

## 2014-06-08 HISTORY — DX: Unspecified mononeuropathy of unspecified lower limb: G57.90

## 2014-06-08 LAB — CBC
HEMATOCRIT: 36.2 % (ref 36.0–46.0)
Hemoglobin: 12.6 g/dL (ref 12.0–15.0)
MCH: 32.5 pg (ref 26.0–34.0)
MCHC: 34.8 g/dL (ref 30.0–36.0)
MCV: 93.3 fL (ref 78.0–100.0)
Platelets: 264 10*3/uL (ref 150–400)
RBC: 3.88 MIL/uL (ref 3.87–5.11)
RDW: 13.9 % (ref 11.5–15.5)
WBC: 5.4 10*3/uL (ref 4.0–10.5)

## 2014-06-08 LAB — BASIC METABOLIC PANEL
ANION GAP: 13 (ref 5–15)
BUN: 14 mg/dL (ref 6–23)
CALCIUM: 8.8 mg/dL (ref 8.4–10.5)
CO2: 22 meq/L (ref 19–32)
CREATININE: 0.84 mg/dL (ref 0.50–1.10)
Chloride: 105 mEq/L (ref 96–112)
GFR calc Af Amer: 85 mL/min — ABNORMAL LOW (ref >90)
GFR calc non Af Amer: 74 mL/min — ABNORMAL LOW (ref >90)
Glucose, Bld: 91 mg/dL (ref 70–99)
Potassium: 4 mEq/L (ref 3.7–5.3)
Sodium: 140 mEq/L (ref 137–147)

## 2014-06-08 LAB — PRO B NATRIURETIC PEPTIDE: Pro B Natriuretic peptide (BNP): 131 pg/mL — ABNORMAL HIGH (ref 0–125)

## 2014-06-08 LAB — I-STAT CG4 LACTIC ACID, ED: Lactic Acid, Venous: 0.61 mmol/L (ref 0.5–2.2)

## 2014-06-08 LAB — I-STAT TROPONIN, ED: Troponin i, poc: 0 ng/mL (ref 0.00–0.08)

## 2014-06-08 LAB — D-DIMER, QUANTITATIVE: D-Dimer, Quant: 0.58 ug/mL-FEU — ABNORMAL HIGH (ref 0.00–0.48)

## 2014-06-08 MED ORDER — IPRATROPIUM-ALBUTEROL 0.5-2.5 (3) MG/3ML IN SOLN
3.0000 mL | Freq: Once | RESPIRATORY_TRACT | Status: AC
  Administered 2014-06-08: 3 mL via RESPIRATORY_TRACT
  Filled 2014-06-08: qty 3

## 2014-06-08 MED ORDER — ASPIRIN 81 MG PO CHEW
324.0000 mg | CHEWABLE_TABLET | Freq: Once | ORAL | Status: AC
  Administered 2014-06-08: 324 mg via ORAL
  Filled 2014-06-08: qty 4

## 2014-06-08 MED ORDER — KETOROLAC TROMETHAMINE 30 MG/ML IJ SOLN
15.0000 mg | Freq: Once | INTRAMUSCULAR | Status: AC
  Administered 2014-06-08: 15 mg via INTRAVENOUS
  Filled 2014-06-08: qty 1

## 2014-06-08 MED ORDER — METHYLPREDNISOLONE SODIUM SUCC 125 MG IJ SOLR
125.0000 mg | Freq: Once | INTRAMUSCULAR | Status: AC
  Administered 2014-06-08: 125 mg via INTRAVENOUS
  Filled 2014-06-08: qty 2

## 2014-06-08 MED ORDER — SODIUM CHLORIDE 0.9 % IV SOLN
INTRAVENOUS | Status: DC
  Administered 2014-06-08: 125 mL/h via INTRAVENOUS

## 2014-06-08 MED ORDER — ONDANSETRON HCL 4 MG/2ML IJ SOLN
4.0000 mg | Freq: Once | INTRAMUSCULAR | Status: AC
Start: 2014-06-08 — End: 2014-06-08
  Administered 2014-06-08: 4 mg via INTRAVENOUS
  Filled 2014-06-08: qty 2

## 2014-06-08 NOTE — ED Notes (Signed)
Spoke with lab, they will add-on the BNP.

## 2014-06-08 NOTE — ED Provider Notes (Addendum)
  Face-to-face evaluation   History: She presents for evaluation of shortness of breath. She recently stopped smoking. She has been using inhalers at home, without relief. No history of lung cancer.  Physical exam: Alert, elderly, appearing female, who looks older than her stated age. Lungs with decreased cerebral, bilaterally and scattered wheezes with rhonchi. No rales.  Medical screening examination/treatment/procedure(s) were conducted as a shared visit with non-physician practitioner(s) and myself.  I personally evaluated the patient during the encounter    Flint Melter, MD 06/10/14 [phone]

## 2014-06-08 NOTE — ED Provider Notes (Signed)
CSN: [id number]     Arrival date & time 06/08/14  1824 History   First MD Initiated Contact with Patient 06/08/14 2218     Chief Complaint  Patient presents with  . Shortness of Breath  . Chest Pain     (Consider location/radiation/quality/duration/timing/severity/associated sxs/prior Treatment) HPI  Cassie Harrell is a 63 y.o. female with past medical history significant for hypertension, COPD, cocaine abuse (denies any use in the last month) complaining of rhinorrhea, postnasal drip onset 1 week ago with anterior chest soreness described as pleuritic, rated at 8/10 increasing shortness of breath. Patient is compliant with respirator which she takes daily, started using her throat her inhaler yesterday with little relief. She also endorses subjective fever and chills. Cough is intermittently productive. On review of systems she notes 2 syncopal episodes in the last 2 weeks. The first was approximately 10 days ago she was sitting at a neighbors house, the second was approximately 5 days ago she was opening a sliding glass door to let her cat out, when she woke up she was on the ground. There was no loss of bowel or bladder control in either event. Denies blood thinners. States that she quit smoking 2 weeks ago. She denies history of DVT or PE, states that occasionally she has pink and red speckled sputum.   Past Medical History  Diagnosis Date  . COPD (chronic obstructive pulmonary disease)   . Hypertension   . Fibromyalgia   . Osteoarthritis   . Bipolar 1 disorder   . Glaucoma   . Sleep apnea   . Cocaine abuse   . GERD (gastroesophageal reflux disease)    Past Surgical History  Procedure Laterality Date  . Abdominal hysterectomy    . Cesarean section    . Eye surgery    . Revision of scar tissue rectus muscle    . Uvulopalatopharyngoplasty    . Appendectomy    . Finger surgery     Family History  Problem Relation Age of Onset  . Other Mother     natural  . Heart  attack Father    History  Substance Use Topics  . Smoking status: Current Every Day Smoker -- 0.50 packs/day for 20 years    Types: Cigarettes  . Smokeless tobacco: Never Used  . Alcohol Use: Yes     Comment: 1-2 cans of beer weekly   OB History   Grav Para Term Preterm Abortions TAB SAB Ect Mult Living                 Review of Systems  10 systems reviewed and found to be negative, except as noted in the HPI.   Allergies  Codeine; Imipramine; and Meloxicam  Home Medications   Prior to Admission medications   Medication Sig Start Date End Date Taking? Authorizing Provider  albuterol (PROVENTIL HFA;VENTOLIN HFA) 108 (90 BASE) MCG/ACT inhaler Inhale 2 puffs into the lungs every 6 (six) hours as needed. For wheezing   Yes Historical Provider, MD  brimonidine-timolol (COMBIGAN) 0.2-0.5 % ophthalmic solution Place 1 drop into both eyes every morning.   Yes Historical Provider, MD  clonazePAM (KLONOPIN) 1 MG tablet Take 2 mg by mouth 2 (two) times daily.    Yes Historical Provider, MD  cyclobenzaprine (FLEXERIL) 10 MG tablet Take 10 mg by mouth at bedtime as needed for muscle spasms.   Yes Historical Provider, MD  escitalopram (LEXAPRO) 20 MG tablet Take 20 mg by mouth at bedtime.    Yes  Historical Provider, MD  guaiFENesin (MUCINEX) 600 MG 12 hr tablet Take 600 mg by mouth 2 (two) times daily as needed for cough or to loosen phlegm.   Yes Historical Provider, MD  ibuprofen (ADVIL,MOTRIN) 800 MG tablet Take 800 mg by mouth every 8 (eight) hours as needed for moderate pain.   Yes Historical Provider, MD  latanoprost (XALATAN) 0.005 % ophthalmic solution Place 2 drops into both eyes at bedtime.   Yes Historical Provider, MD  lisinopril-hydrochlorothiazide (PRINZIDE,ZESTORETIC) 20-12.5 MG per tablet Take 1 tablet by mouth daily.   Yes Historical Provider, MD  omeprazole (PRILOSEC) 20 MG capsule Take 40 mg by mouth every morning.    Yes Historical Provider, MD  tiotropium (SPIRIVA) 18 MCG  inhalation capsule Place 18 mcg into inhaler and inhale daily.     Yes Historical Provider, MD  ziprasidone (GEODON) 20 MG capsule Take 20 mg by mouth at bedtime.   Yes Historical Provider, MD   BP 141/92  Pulse 83  Temp(Src) 97.2 F (36.2 C) (Axillary)  Resp 24  Wt 137 lb (62.143 kg)  SpO2 100% Physical Exam  Nursing note and vitals reviewed. Constitutional: She is oriented to person, place, and time. She appears well-developed and well-nourished. No distress.  HENT:  Head: Normocephalic and atraumatic.  Mouth/Throat: Oropharynx is clear and moist.  Eyes: Conjunctivae and EOM are normal. Pupils are equal, round, and reactive to light.  Neck: Normal range of motion.  Cardiovascular: Normal rate, regular rhythm and intact distal pulses.   Pulmonary/Chest: Effort normal and breath sounds normal. No stridor. No respiratory distress. She has no wheezes. She has no rales. She exhibits no tenderness.  Tachypnea, increased work of breathing, patient states she is tiring. Patient has essentially no air movement on the right side. There are scattered diffuse expiratory wheezing bilaterally, expiratory phase is significantly prolonged, there is better air movement on the left but it is still decreased,  Abdominal: Soft. Bowel sounds are normal. She exhibits no distension and no mass. There is no tenderness. There is no rebound and no guarding.  Musculoskeletal: Normal range of motion. She exhibits no edema and no tenderness.  Neurological: She is alert and oriented to person, place, and time.  Psychiatric: She has a normal mood and affect.    ED Course  Procedures (including critical care time) Labs Review Labs Reviewed  BASIC METABOLIC PANEL - Abnormal; Notable for the following:    GFR calc non Af Amer 74 (*)    GFR calc Af Amer 85 (*)    All other components within normal limits  PRO B NATRIURETIC PEPTIDE - Abnormal; Notable for the following:    Pro B Natriuretic peptide (BNP) 131.0 (*)     All other components within normal limits  D-DIMER, QUANTITATIVE - Abnormal; Notable for the following:    D-Dimer, Quant 0.58 (*)    All other components within normal limits  CBC  I-STAT TROPOININ, ED  I-STAT CG4 LACTIC ACID, ED    Imaging Review Dg Chest 2 View (if Patient Has Fever And/or Copd)  06/08/2014   CLINICAL DATA:  Chest tightness shortness of breath cough history of COPD and hypertension  EXAM: CHEST  2 VIEW  COMPARISON:  04/12/2013  FINDINGS: Heart size and vascular pattern are normal. 1 cm rounded opacity right upper lobe. This could represent a snap or and EKG lead potentially. In the right lower lobe there is mild scarring or atelectasis. There are no pleural effusions. There is evidence of periosteal  reaction and cortical irregularity involving the lateral infraglenoid scapula on the left. This was not present previously.  IMPRESSION: No acute findings. Recommend examining the patient to determine if rounded opacity projecting over right upper lobe may be external to the patient. If so, recommend removing the optic and repeating the PA radiograph. Otherwise consider CT thorax. However, CT scan may be most appropriate given the possible bone lesion involving the left scapula, which should also be further evaluated.   Electronically Signed   By: Esperanza Heir M.D.   On: 06/08/2014 20:23   Ct Chest W Contrast  06/09/2014   CLINICAL DATA:  Shortness of breath and chest pain. Abnormal chest x-ray suggesting possible right apical nodule and scapular lesion on the left.  EXAM: CT CHEST WITH CONTRAST  TECHNIQUE: Multidetector CT imaging of the chest was performed during intravenous contrast administration.  CONTRAST:  CT Cardiac 07/18/2012  COMPARISON:  Normal heart size. Normal caliber thoracic aorta. No evidence of aortic aneurysm. Great vessel origins are patent. Small esophageal hiatal hernia. Esophagus is decompressed. No significant lymphadenopathy in the chest. Sub cm nodules in  the left lobe of the thyroid gland without overall thyroid enlargement. No pleural effusion.  Patchy focal areas of infiltration in the mid and lower lungs bilaterally likely represent areas of atelectasis or possibly focal pneumonitis. Airways appear patent. No discrete pulmonary nodules are demonstrated. Ovoid nodular opacity seen in the right upper chest on chest radiograph may have been extrinsic. Left scapula appears intact without definite lesion identified.  At the upper abdominal images demonstrate probable subcentimeter cysts or hemangiomas in the liver. Prominence of the adrenal glands bilaterally may represent small adenomas or hyperplasia.  FINDINGS: Patchy focal areas of infiltration or atelectasis in the mid and lower lungs bilaterally. No discrete nodules or bone lesions appreciated.   Electronically Signed   By: Burman Nieves M.D.   On: 06/09/2014 00:58     EKG Interpretation None     73 beats per minute, normal sinus rhythm, borderline left-sided axis with LVH, no significant ST or T wave changes. MDM   Final diagnoses:  CAP (community acquired pneumonia)  Pulmonary emphysema, unspecified emphysema type    Filed Vitals:   06/09/14 0039 06/09/14 0051 06/09/14 0056 06/09/14 0100  BP:  147/78  141/92  Pulse: 82 74  83  Temp:   97.2 F (36.2 C)   TempSrc:   Axillary   Resp: Weight:      SpO2: 97% 100%  100%    Medications  0.9 %  sodium chloride infusion (125 mL/hr Intravenous New Bag/Given 06/08/14 2303)  albuterol (PROVENTIL,VENTOLIN) solution continuous neb (10 mg/hr Nebulization New Bag/Given 06/09/14 0038)  levofloxacin (LEVAQUIN) IVPB 750 mg (not administered)  methylPREDNISolone sodium succinate (SOLU-MEDROL) 125 mg/2 mL injection 125 mg (125 mg Intravenous Given 06/08/14 2304)  ipratropium-albuterol (DUONEB) 0.5-2.5 (3) MG/3ML nebulizer solution 3 mL (3 mLs Nebulization Given 06/08/14 2309)  aspirin chewable tablet 324 mg (324 mg Oral Given 06/08/14  2304)  ondansetron (ZOFRAN) injection 4 mg (4 mg Intravenous Given 06/08/14 2304)  ketorolac (TORADOL) 30 MG/ML injection 15 mg (15 mg Intravenous Given 06/08/14 2305)  ipratropium-albuterol (DUONEB) 0.5-2.5 (3) MG/3ML nebulizer solution 3 mL (3 mLs Nebulization Given 06/08/14 2337)  iohexol (OMNIPAQUE) 300 MG/ML solution 75 mL (75 mLs Intravenous Contrast Given 06/09/14 0030)  cefTRIAXone (ROCEPHIN) 1 g in dextrose 5 % 50 mL IVPB (0 g Intravenous Stopped 06/09/14 0144)  morphine 4 MG/ML injection 4 mg (  4 mg Intravenous Given 06/09/14 0120)    Laurna Ree Radziewicz is a 63 y.o. female presenting with pleuritic chest pain, shortness of breath, wheezing. Patient is tachypnea, increased work of breathing, she is saturating well on room air. Vital signs are stable. There is extremely poor air movement bilaterally but especially on the right side. Patient will be given to a Medrol and DuoNeb. D-dimer and pro BNP pending. Chest x-ray shows opacity in the right upper lobe. Considering patient's extensive smoking history I will CT chest after she receives her nebulizers.   11:33 PM: Patient seen and reexamined at bedside. Lung sounds are minimally improved. Still significant wheezing and poor air movement. Second DuoNeb ordered.  D-dimer is 0.58, no significant elevation when adjusted for age.  12:17 PM: Patient seen and reexamined at the bedside. Her lung sounds show better air movement and significant increase in wheezing bilaterally. Patient reports her chest pain is unchanged, pleuritic in 8/10, will order morphine. Going to put her on a continuous nebulizer. Chest CT pending.  CT shows patchy infiltrate and atelectasis in the mid and lower lungs bilaterally. Considering multi-lobar bilateral pneumonia, in setting of COPD and poorly improving air movement and breath sounds. She will require admission. Patient will be started on Levaquin and Rocephin.  Case discussed with triad hospitalist Dr. Allena Katz. Will  evaluate the patient.  This is a shared visit with the attending physician who personally evaluated the patient and agrees with the care plan.   This is a signout to PA [place] at shift change, she will follow with Dr. Allena Katz.       Wynetta Emery, PA-C 06/09/14 [phone]

## 2014-06-08 NOTE — ED Notes (Signed)
Apologized to pt for wit time. Pt in NAD. Denies pain.

## 2014-06-08 NOTE — ED Notes (Signed)
I Stat Lactic Acid results shown to N. Pisciotta PA

## 2014-06-08 NOTE — ED Notes (Signed)
Pt reports on Saturday with URI, then over past few days, moved into her chest, with soreness in her chest and SOB. Reports cloudy sputum.

## 2014-06-09 ENCOUNTER — Encounter (HOSPITAL_COMMUNITY): Payer: Self-pay

## 2014-06-09 ENCOUNTER — Emergency Department (HOSPITAL_COMMUNITY): Payer: Medicare Other

## 2014-06-09 DIAGNOSIS — IMO0001 Reserved for inherently not codable concepts without codable children: Secondary | ICD-10-CM

## 2014-06-09 DIAGNOSIS — F411 Generalized anxiety disorder: Secondary | ICD-10-CM | POA: Diagnosis present

## 2014-06-09 DIAGNOSIS — K219 Gastro-esophageal reflux disease without esophagitis: Secondary | ICD-10-CM

## 2014-06-09 DIAGNOSIS — J189 Pneumonia, unspecified organism: Principal | ICD-10-CM

## 2014-06-09 DIAGNOSIS — G4730 Sleep apnea, unspecified: Secondary | ICD-10-CM | POA: Diagnosis present

## 2014-06-09 DIAGNOSIS — Z888 Allergy status to other drugs, medicaments and biological substances status: Secondary | ICD-10-CM | POA: Diagnosis not present

## 2014-06-09 DIAGNOSIS — R0789 Other chest pain: Secondary | ICD-10-CM | POA: Diagnosis present

## 2014-06-09 DIAGNOSIS — F17200 Nicotine dependence, unspecified, uncomplicated: Secondary | ICD-10-CM | POA: Diagnosis present

## 2014-06-09 DIAGNOSIS — F1410 Cocaine abuse, uncomplicated: Secondary | ICD-10-CM | POA: Diagnosis present

## 2014-06-09 DIAGNOSIS — H409 Unspecified glaucoma: Secondary | ICD-10-CM | POA: Diagnosis present

## 2014-06-09 DIAGNOSIS — J438 Other emphysema: Secondary | ICD-10-CM

## 2014-06-09 DIAGNOSIS — M1990 Unspecified osteoarthritis, unspecified site: Secondary | ICD-10-CM | POA: Diagnosis present

## 2014-06-09 DIAGNOSIS — J449 Chronic obstructive pulmonary disease, unspecified: Secondary | ICD-10-CM | POA: Diagnosis present

## 2014-06-09 DIAGNOSIS — G608 Other hereditary and idiopathic neuropathies: Secondary | ICD-10-CM | POA: Diagnosis present

## 2014-06-09 DIAGNOSIS — F319 Bipolar disorder, unspecified: Secondary | ICD-10-CM | POA: Diagnosis present

## 2014-06-09 DIAGNOSIS — IMO0002 Reserved for concepts with insufficient information to code with codable children: Secondary | ICD-10-CM | POA: Diagnosis not present

## 2014-06-09 DIAGNOSIS — I10 Essential (primary) hypertension: Secondary | ICD-10-CM | POA: Diagnosis present

## 2014-06-09 DIAGNOSIS — J440 Chronic obstructive pulmonary disease with acute lower respiratory infection: Secondary | ICD-10-CM | POA: Diagnosis present

## 2014-06-09 DIAGNOSIS — J441 Chronic obstructive pulmonary disease with (acute) exacerbation: Secondary | ICD-10-CM

## 2014-06-09 DIAGNOSIS — R079 Chest pain, unspecified: Secondary | ICD-10-CM | POA: Diagnosis present

## 2014-06-09 DIAGNOSIS — Z791 Long term (current) use of non-steroidal anti-inflammatories (NSAID): Secondary | ICD-10-CM | POA: Diagnosis not present

## 2014-06-09 DIAGNOSIS — Z79899 Other long term (current) drug therapy: Secondary | ICD-10-CM | POA: Diagnosis not present

## 2014-06-09 DIAGNOSIS — J209 Acute bronchitis, unspecified: Secondary | ICD-10-CM | POA: Diagnosis not present

## 2014-06-09 DIAGNOSIS — M797 Fibromyalgia: Secondary | ICD-10-CM | POA: Diagnosis present

## 2014-06-09 LAB — TROPONIN I: Troponin I: <0.3 ng/mL (ref <0.30)

## 2014-06-09 MED ORDER — LISINOPRIL-HYDROCHLOROTHIAZIDE 20-12.5 MG PO TABS: 1.0000 | ORAL_TABLET | Freq: Every day | ORAL | Status: DC

## 2014-06-09 MED ORDER — ALBUTEROL (5 MG/ML) CONTINUOUS INHALATION SOLN
10.0000 mg/h | INHALATION_SOLUTION | RESPIRATORY_TRACT | Status: DC
  Administered 2014-06-09: 10 mg/h via RESPIRATORY_TRACT
  Filled 2014-06-09: qty 20

## 2014-06-09 MED ORDER — ALBUTEROL SULFATE HFA 108 (90 BASE) MCG/ACT IN AERS: 2.0000 | INHALATION_SPRAY | RESPIRATORY_TRACT | Status: DC | PRN

## 2014-06-09 MED ORDER — KETOROLAC TROMETHAMINE 30 MG/ML IJ SOLN
15.0000 mg | Freq: Three times a day (TID) | INTRAMUSCULAR | Status: DC
  Administered 2014-06-09 – 2014-06-10 (×3): 15 mg via INTRAVENOUS
  Filled 2014-06-09 (×5): qty 1

## 2014-06-09 MED ORDER — ACETAMINOPHEN 325 MG PO TABS: 650.0000 mg | ORAL_TABLET | ORAL | Status: DC | PRN

## 2014-06-09 MED ORDER — LEVOFLOXACIN IN D5W 750 MG/150ML IV SOLN
750.0000 mg | Freq: Once | INTRAVENOUS | Status: AC
  Administered 2014-06-09: 750 mg via INTRAVENOUS
  Filled 2014-06-09: qty 150

## 2014-06-09 MED ORDER — ENOXAPARIN SODIUM 40 MG/0.4ML ~~LOC~~ SOLN
40.0000 mg | SUBCUTANEOUS | Status: DC
  Administered 2014-06-09: 40 mg via SUBCUTANEOUS
  Filled 2014-06-09 (×2): qty 0.4

## 2014-06-09 MED ORDER — MORPHINE SULFATE 2 MG/ML IJ SOLN
2.0000 mg | INTRAMUSCULAR | Status: DC | PRN
  Administered 2014-06-09: 2 mg via INTRAVENOUS
  Filled 2014-06-09: qty 1

## 2014-06-09 MED ORDER — ESCITALOPRAM OXALATE 20 MG PO TABS
20.0000 mg | ORAL_TABLET | Freq: Every day | ORAL | Status: DC
  Administered 2014-06-09: 20 mg via ORAL
  Filled 2014-06-09 (×2): qty 1

## 2014-06-09 MED ORDER — GUAIFENESIN ER 600 MG PO TB12
600.0000 mg | ORAL_TABLET | Freq: Two times a day (BID) | ORAL | Status: DC | PRN
  Filled 2014-06-09: qty 1

## 2014-06-09 MED ORDER — ZIPRASIDONE HCL 20 MG PO CAPS
20.0000 mg | ORAL_CAPSULE | Freq: Every day | ORAL | Status: DC
  Administered 2014-06-09: 20 mg via ORAL
  Filled 2014-06-09 (×2): qty 1

## 2014-06-09 MED ORDER — BRIMONIDINE TARTRATE-TIMOLOL 0.2-0.5 % OP SOLN: 1.0000 [drp] | OPHTHALMIC | Status: DC

## 2014-06-09 MED ORDER — LEVOFLOXACIN IN D5W 750 MG/150ML IV SOLN: 750.0000 mg | INTRAVENOUS | Status: DC

## 2014-06-09 MED ORDER — CLONAZEPAM 1 MG PO TABS
2.0000 mg | ORAL_TABLET | Freq: Two times a day (BID) | ORAL | Status: DC | PRN
  Administered 2014-06-09 (×2): 2 mg via ORAL
  Filled 2014-06-09: qty 2
  Filled 2014-06-09: qty 4

## 2014-06-09 MED ORDER — DEXTROSE 5 % IV SOLN
1.0000 g | Freq: Once | INTRAVENOUS | Status: AC
  Administered 2014-06-09: 1 g via INTRAVENOUS
  Filled 2014-06-09: qty 10

## 2014-06-09 MED ORDER — IOHEXOL 300 MG/ML  SOLN
75.0000 mL | Freq: Once | INTRAMUSCULAR | Status: AC | PRN
  Administered 2014-06-09: 75 mL via INTRAVENOUS

## 2014-06-09 MED ORDER — LATANOPROST 0.005 % OP SOLN
2.0000 [drp] | Freq: Every day | OPHTHALMIC | Status: DC
  Administered 2014-06-09: 2 [drp] via OPHTHALMIC
  Filled 2014-06-09: qty 2.5

## 2014-06-09 MED ORDER — BRIMONIDINE TARTRATE 0.2 % OP SOLN
1.0000 [drp] | Freq: Every day | OPHTHALMIC | Status: DC
  Administered 2014-06-09 – 2014-06-10 (×2): 1 [drp] via OPHTHALMIC
  Filled 2014-06-09 (×2): qty 5

## 2014-06-09 MED ORDER — ALBUTEROL SULFATE (2.5 MG/3ML) 0.083% IN NEBU
2.5000 mg | INHALATION_SOLUTION | RESPIRATORY_TRACT | Status: DC | PRN
Start: 2014-06-09 — End: 2014-06-10
  Administered 2014-06-09: 2.5 mg via RESPIRATORY_TRACT
  Filled 2014-06-09: qty 3

## 2014-06-09 MED ORDER — ONDANSETRON HCL 4 MG/2ML IJ SOLN: 4.0000 mg | Freq: Four times a day (QID) | INTRAMUSCULAR | Status: DC | PRN

## 2014-06-09 MED ORDER — SODIUM CHLORIDE 0.9 % IV SOLN
INTRAVENOUS | Status: DC
  Administered 2014-06-09: 75 mL/h via INTRAVENOUS

## 2014-06-09 MED ORDER — HYDROCHLOROTHIAZIDE 12.5 MG PO CAPS
12.5000 mg | ORAL_CAPSULE | Freq: Every day | ORAL | Status: DC
  Administered 2014-06-09 – 2014-06-10 (×2): 12.5 mg via ORAL
  Filled 2014-06-09 (×3): qty 1

## 2014-06-09 MED ORDER — TIMOLOL MALEATE 0.5 % OP SOLN
1.0000 [drp] | Freq: Every day | OPHTHALMIC | Status: DC
  Administered 2014-06-10: 1 [drp] via OPHTHALMIC
  Filled 2014-06-09: qty 5

## 2014-06-09 MED ORDER — IPRATROPIUM-ALBUTEROL 0.5-2.5 (3) MG/3ML IN SOLN
3.0000 mL | RESPIRATORY_TRACT | Status: DC
  Filled 2014-06-09: qty 3

## 2014-06-09 MED ORDER — ZOLPIDEM TARTRATE 5 MG PO TABS
5.0000 mg | ORAL_TABLET | Freq: Once | ORAL | Status: AC
  Administered 2014-06-09: 5 mg via ORAL
  Filled 2014-06-09: qty 1

## 2014-06-09 MED ORDER — AZITHROMYCIN 500 MG IV SOLR
500.0000 mg | INTRAVENOUS | Status: DC
  Administered 2014-06-09: 500 mg via INTRAVENOUS
  Filled 2014-06-09 (×2): qty 500

## 2014-06-09 MED ORDER — PREDNISONE 20 MG PO TABS
40.0000 mg | ORAL_TABLET | Freq: Every day | ORAL | Status: DC
  Administered 2014-06-09 – 2014-06-10 (×2): 40 mg via ORAL
  Filled 2014-06-09 (×3): qty 2

## 2014-06-09 MED ORDER — CYCLOBENZAPRINE HCL 10 MG PO TABS
10.0000 mg | ORAL_TABLET | Freq: Every evening | ORAL | Status: DC | PRN
  Filled 2014-06-09: qty 1

## 2014-06-09 MED ORDER — LISINOPRIL 20 MG PO TABS
20.0000 mg | ORAL_TABLET | Freq: Every day | ORAL | Status: DC
  Administered 2014-06-09 – 2014-06-10 (×2): 20 mg via ORAL
  Filled 2014-06-09 (×3): qty 1

## 2014-06-09 MED ORDER — PANTOPRAZOLE SODIUM 40 MG PO TBEC
40.0000 mg | DELAYED_RELEASE_TABLET | Freq: Every day | ORAL | Status: DC
  Administered 2014-06-09: 40 mg via ORAL
  Filled 2014-06-09: qty 1

## 2014-06-09 MED ORDER — DEXTROSE 5 % IV SOLN
1.0000 g | INTRAVENOUS | Status: DC
  Administered 2014-06-09: 1 g via INTRAVENOUS
  Filled 2014-06-09 (×2): qty 10

## 2014-06-09 MED ORDER — MORPHINE SULFATE 4 MG/ML IJ SOLN
4.0000 mg | Freq: Once | INTRAMUSCULAR | Status: AC
  Administered 2014-06-09: 4 mg via INTRAVENOUS
  Filled 2014-06-09: qty 1

## 2014-06-09 MED ORDER — TIOTROPIUM BROMIDE MONOHYDRATE 18 MCG IN CAPS
18.0000 ug | ORAL_CAPSULE | Freq: Every day | RESPIRATORY_TRACT | Status: DC
  Administered 2014-06-09 – 2014-06-10 (×2): 18 ug via RESPIRATORY_TRACT
  Filled 2014-06-09: qty 5

## 2014-06-09 MED ORDER — IPRATROPIUM-ALBUTEROL 0.5-2.5 (3) MG/3ML IN SOLN
3.0000 mL | RESPIRATORY_TRACT | Status: DC | PRN
  Administered 2014-06-09: 3 mL via RESPIRATORY_TRACT

## 2014-06-09 NOTE — Progress Notes (Signed)
Utilization review completed.  

## 2014-06-09 NOTE — Consult Note (Signed)
CARDIOLOGY CONSULT NOTE       Patient ID: Cassie Harrell MRN: [id number] DOB/AGE: 1951-06-27 63 y.o.  Admit date: 06/08/2014 Referring Physician:  Robb Matar Primary Physician: Dorrene German, MD Primary Cardiologist:  New Reason for Consultation:  Chest Pain  Principal Problem:   Chest pain Active Problems:   COPD (chronic obstructive pulmonary disease)   Fibromyalgia   Bipolar 1 disorder   GERD (gastroesophageal reflux disease)   CAP (community acquired pneumonia)   HPI:   63 y.o. female with Past medical history of COPD, hypertension, fibromyalgia, bipolar disorder, substance abuse with cocaine, GERD.  The patient presents with complaints of chest pain and shortness of breath with cough. She mentions that since last Friday she has been having progressively worsening shortness of breath with tightness in her chest and also she has a pain in her chest which is stabbing. She complains of some fever and chills as well. She brings up yellowish expectoration. She denies any diarrhea or constipation or burning urination. She complains of some nausea but no vomiting. No history of CAD.  Pain free this am  Some pleuritic component.  She has been very tremulous.    She complains of low generalized weakness and fatigue.  She mentions she quit smoking 2 weeks ago.  She denies any alcohol or drug abuse at present.    ROS All other systems reviewed and negative except as noted above  Past Medical History  Diagnosis Date  . COPD (chronic obstructive pulmonary disease)   . Hypertension   . Osteoarthritis   . Bipolar 1 disorder   . Glaucoma   . Sleep apnea   . Cocaine abuse   . GERD (gastroesophageal reflux disease)   . Neuropathy of foot     bilateral    Family History  Problem Relation Age of Onset  . Other Mother     natural  . Heart attack Father   . Other Sister     kidney transplant  . Alcoholism Brother   . Goiter Sister   . Diabetes Brother   . Diabetes Sister       History   Social History  . Marital Status: Single    Spouse Name: N/A    Number of Children: 1  . Years of Education: College   Occupational History  .  Other    disabled   Social History Main Topics  . Smoking status: Current Every Day Smoker -- 0.50 packs/day for 20 years    Types: Cigarettes  . Smokeless tobacco: Never Used  . Alcohol Use: Yes     Comment: 1-2 cans of beer weekly  . Drug Use: Yes    Special: Cocaine     Comment: within the last couple of week; quit 1 yr ago (2014)  . Sexual Activity: No   Other Topics Concern  . Not on file   Social History Narrative   Patient lives at home alone.   Caffeine Use: quit 2 yrs ago    Past Surgical History  Procedure Laterality Date  . Abdominal hysterectomy    . Cesarean section    . Eye surgery    . Revision of scar tissue rectus muscle    . Uvulopalatopharyngoplasty    . Appendectomy    . Finger surgery       . azithromycin  500 mg Intravenous Q24H  . brimonidine  1 drop Both Eyes Daily   And  . timolol  1 drop Both Eyes Daily  .  cefTRIAXone (ROCEPHIN)  IV  1 g Intravenous Q24H  . enoxaparin (LOVENOX) injection  40 mg Subcutaneous Q24H  . escitalopram  20 mg Oral QHS  . lisinopril  20 mg Oral Daily   And  . hydrochlorothiazide  12.5 mg Oral Daily  . ketorolac  15 mg Intravenous 3 times per day  . latanoprost  2 drop Both Eyes QHS  . pantoprazole  40 mg Oral Daily  . predniSONE  40 mg Oral Q breakfast  . tiotropium  18 mcg Inhalation Daily  . ziprasidone  20 mg Oral QHS      Physical Exam: Blood pressure 133/76, pulse 91, temperature 98 F (36.7 C), temperature source Oral, resp. rate 19, height 5' 2.99" (1.6 m), weight 137 lb (62.143 kg), SpO2 95.00%.    Affect appropriate Chronically ill black female HEENT: normal Neck supple with no adenopathy JVP normal no bruits no thyromegaly Lungs  Poor air movement  no wheezing and good diaphragmatic motion Heart:  S1/S2 no murmur, no rub, gallop or  click PMI normal Abdomen: benighn, BS positve, no tenderness, no AAA no bruit.  No HSM or HJR Distal pulses intact with no bruits No edema Neuro non-focal  Tremulous  Skin warm and dry No muscular weakness   Labs:   Lab Results  Component Value Date   WBC 5.4 06/08/2014   HGB 12.6 06/08/2014   HCT 36.2 06/08/2014   MCV 93.3 06/08/2014   PLT 264 06/08/2014    Recent Labs Lab 06/08/14 1839  NA 140  K 4.0  CL 105  CO2 22  BUN 14  CREATININE 0.84  CALCIUM 8.8  GLUCOSE 91   Lab Results  Component Value Date   CKTOTAL 56 07/17/2012   TROPONINI <0.30 06/09/2014     Radiology: Dg Chest 2 View (if Patient Has Fever And/or Copd)  06/08/2014   CLINICAL DATA:  Chest tightness shortness of breath cough history of COPD and hypertension  EXAM: CHEST  2 VIEW  COMPARISON:  04/12/2013  FINDINGS: Heart size and vascular pattern are normal. 1 cm rounded opacity right upper lobe. This could represent a snap or and EKG lead potentially. In the right lower lobe there is mild scarring or atelectasis. There are no pleural effusions. There is evidence of periosteal reaction and cortical irregularity involving the lateral infraglenoid scapula on the left. This was not present previously.  IMPRESSION: No acute findings. Recommend examining the patient to determine if rounded opacity projecting over right upper lobe may be external to the patient. If so, recommend removing the optic and repeating the PA radiograph. Otherwise consider CT thorax. However, CT scan may be most appropriate given the possible bone lesion involving the left scapula, which should also be further evaluated.   Electronically Signed   By: Esperanza Heir M.D.   On: 06/08/2014 20:23   Ct Chest W Contrast  06/09/2014   CLINICAL DATA:  Shortness of breath and chest pain. Abnormal chest x-ray suggesting possible right apical nodule and scapular lesion on the left.  EXAM: CT CHEST WITH CONTRAST  TECHNIQUE: Multidetector CT imaging of the  chest was performed during intravenous contrast administration.  CONTRAST:  CT Cardiac 07/18/2012  COMPARISON:  Normal heart size. Normal caliber thoracic aorta. No evidence of aortic aneurysm. Great vessel origins are patent. Small esophageal hiatal hernia. Esophagus is decompressed. No significant lymphadenopathy in the chest. Sub cm nodules in the left lobe of the thyroid gland without overall thyroid enlargement. No pleural effusion.  Patchy  focal areas of infiltration in the mid and lower lungs bilaterally likely represent areas of atelectasis or possibly focal pneumonitis. Airways appear patent. No discrete pulmonary nodules are demonstrated. Ovoid nodular opacity seen in the right upper chest on chest radiograph may have been extrinsic. Left scapula appears intact without definite lesion identified.  At the upper abdominal images demonstrate probable subcentimeter cysts or hemangiomas in the liver. Prominence of the adrenal glands bilaterally may represent small adenomas or hyperplasia.  FINDINGS: Patchy focal areas of infiltration or atelectasis in the mid and lower lungs bilaterally. No discrete nodules or bone lesions appreciated.   Electronically Signed   By: Burman Nieves M.D.   On: 06/09/2014 00:58    EKG:  SR  LAD no acute ST changes    ASSESSMENT AND PLAN:  Chest Pain:  Atypical related to COPD exacerbation  R/O ECG ok  No indication for stress testing at this time  Echo to assess RV/LV function COPD:  Smoking since age 56  She indicates stopping 2 weeks ago but then having "some" more.  Patchy LL infiltrates on CT  Continue prednisone, inhalers And antibiotics  Try to avoid beta agonists as she is very tremulous  Consider UDS and thyroid labs HTN:  Consider changing to ARB given COPD and issues with GERD and coughing  Bipolar:  Continue home meds seems compensated   Signed: Charlton Haws 06/09/2014, 8:35 AM

## 2014-06-09 NOTE — H&P (Signed)
[facility] History and Physical  Patient: Cassie Harrell  NWG:[id number]  DOB: [birth year]  DOS: the patient was seen and examined on 06/09/2014 PCP: Dorrene German, MD  Chief Complaint: Shortness of breath  HPI: Cassie Harrell is a 63 y.o. female with Past medical history of COPD, hypertension, fibromyalgia, bipolar disorder, substance abuse with cocaine, GERD. The patient presents with complaints of chest pain and shortness of breath with cough. She mentions that since last Friday she has been having progressively worsening shortness of breath with tightness in her chest and also she has a pain in her chest which is stabbing. She complains of some fever and chills as well. She brings up yellowish expectoration. She denies any diarrhea or constipation or burning urination. She complains of some nausea but no vomiting. She complains of low generalized weakness and fatigue. She mentions she quit smoking 2 weeks ago. She denies any alcohol or drug abuse at present. As per the ED physician the patient initially presented with severe wheezing and shortness of breath, the time of my evaluation improved significantly.  The patient [place] And at her baseline independent for most of her ADL.  Review of Systems: as mentioned in the history of present illness.  A Comprehensive review of the other systems is negative.  Past Medical History  Diagnosis Date  . COPD (chronic obstructive pulmonary disease)   . Hypertension   . Fibromyalgia   . Osteoarthritis   . Bipolar 1 disorder   . Glaucoma   . Sleep apnea   . Cocaine abuse   . GERD (gastroesophageal reflux disease)    Past Surgical History  Procedure Laterality Date  . Abdominal hysterectomy    . Cesarean section    . Eye surgery    . Revision of scar tissue rectus muscle    . Uvulopalatopharyngoplasty    . Appendectomy    . Finger surgery     Social History:  reports that she has been smoking  Cigarettes.  She has a 10 pack-year smoking history. She has never used smokeless tobacco. She reports that she drinks alcohol. She reports that she does not use illicit drugs.  Allergies  Allergen Reactions  . Codeine Hives and Nausea And Vomiting  . Imipramine Rash  . Meloxicam Itching    Family History  Problem Relation Age of Onset  . Other Mother     natural  . Heart attack Father     Prior to Admission medications   Medication Sig Start Date End Date Taking? Authorizing Provider  albuterol (PROVENTIL HFA;VENTOLIN HFA) 108 (90 BASE) MCG/ACT inhaler Inhale 2 puffs into the lungs every 6 (six) hours as needed. For wheezing   Yes Historical Provider, MD  brimonidine-timolol (COMBIGAN) 0.2-0.5 % ophthalmic solution Place 1 drop into both eyes every morning.   Yes Historical Provider, MD  clonazePAM (KLONOPIN) 1 MG tablet Take 2 mg by mouth 2 (two) times daily.    Yes Historical Provider, MD  cyclobenzaprine (FLEXERIL) 10 MG tablet Take 10 mg by mouth at bedtime as needed for muscle spasms.   Yes Historical Provider, MD  escitalopram (LEXAPRO) 20 MG tablet Take 20 mg by mouth at bedtime.    Yes Historical Provider, MD  guaiFENesin (MUCINEX) 600 MG 12 hr tablet Take 600 mg by mouth 2 (two) times daily as needed for cough or to loosen phlegm.   Yes Historical Provider, MD  ibuprofen (ADVIL,MOTRIN) 800 MG tablet Take 800 mg by mouth every 8 (eight)  hours as needed for moderate pain.   Yes Historical Provider, MD  latanoprost (XALATAN) 0.005 % ophthalmic solution Place 2 drops into both eyes at bedtime.   Yes Historical Provider, MD  lisinopril-hydrochlorothiazide (PRINZIDE,ZESTORETIC) 20-12.5 MG per tablet Take 1 tablet by mouth daily.   Yes Historical Provider, MD  omeprazole (PRILOSEC) 20 MG capsule Take 40 mg by mouth every morning.    Yes Historical Provider, MD  tiotropium (SPIRIVA) 18 MCG inhalation capsule Place 18 mcg into inhaler and inhale daily.     Yes Historical Provider, MD   ziprasidone (GEODON) 20 MG capsule Take 20 mg by mouth at bedtime.   Yes Historical Provider, MD    Physical Exam: Filed Vitals:   06/09/14 0330 06/09/14 0400 06/09/14 0430 06/09/14 0530  BP: 150/77 143/66 137/68 145/80  Pulse: 109 100 99 91  Temp:    97.8 F (36.6 C)  TempSrc:    Oral  Resp: Height:      Weight:      SpO2: 96% 96% 94% 98%    General: Alert, Awake and Oriented to Time, Place and Person. Appear in mild distress Eyes: PERRL ENT: Oral Mucosa clear moist Neck: No JVD Cardiovascular: S1 and S2 Present, no Murmur, Peripheral Pulses Present Respiratory: Bilateral Air entry equal and Decreased, Clear to Auscultation, noCrackles, minimal occasional wheezes Abdomen: Bowel Sound Present, Soft and Non tender Skin: no Rash Extremities: no Pedal edema, no calf tenderness Neurologic: Grossly no focal neuro deficit.  Labs on Admission:  CBC:  Recent Labs Lab 06/08/14 1839  WBC 5.4  HGB 12.6  HCT 36.2  MCV 93.3  PLT 264    CMP     Component Value Date/Time   NA 140 06/08/2014 1839   K 4.0 06/08/2014 1839   CL 105 06/08/2014 1839   CO2 22 06/08/2014 1839   GLUCOSE 91 06/08/2014 1839   BUN 14 06/08/2014 1839   CREATININE 0.84 06/08/2014 1839   CALCIUM 8.8 06/08/2014 1839   PROT 7.1 05/23/2012 2331   ALBUMIN 3.9 05/23/2012 2331   AST 18 05/23/2012 2331   ALT 10 05/23/2012 2331   ALKPHOS 53 05/23/2012 2331   BILITOT 0.2* 05/23/2012 2331   GFRNONAA 74* 06/08/2014 1839   GFRAA 85* 06/08/2014 1839    No results found for this basename: LIPASE, AMYLASE,  in the last 168 hours No results found for this basename: AMMONIA,  in the last 168 hours   Recent Labs Lab 06/09/14 0248  TROPONINI <0.30   BNP (last 3 results)  Recent Labs  06/08/14 1835  PROBNP 131.0*    Radiological Exams on Admission: Dg Chest 2 View (if Patient Has Fever And/or Copd)  06/08/2014   CLINICAL DATA:  Chest tightness shortness of breath cough history of COPD and hypertension  EXAM:  CHEST  2 VIEW  COMPARISON:  04/12/2013  FINDINGS: Heart size and vascular pattern are normal. 1 cm rounded opacity right upper lobe. This could represent a snap or and EKG lead potentially. In the right lower lobe there is mild scarring or atelectasis. There are no pleural effusions. There is evidence of periosteal reaction and cortical irregularity involving the lateral infraglenoid scapula on the left. This was not present previously.  IMPRESSION: No acute findings. Recommend examining the patient to determine if rounded opacity projecting over right upper lobe may be external to the patient. If so, recommend removing the optic and repeating the PA radiograph. Otherwise consider CT thorax. However, CT scan  may be most appropriate given the possible bone lesion involving the left scapula, which should also be further evaluated.   Electronically Signed   By: Esperanza Heir M.D.   On: 06/08/2014 20:23   Ct Chest W Contrast  06/09/2014   CLINICAL DATA:  Shortness of breath and chest pain. Abnormal chest x-ray suggesting possible right apical nodule and scapular lesion on the left.  EXAM: CT CHEST WITH CONTRAST  TECHNIQUE: Multidetector CT imaging of the chest was performed during intravenous contrast administration.  CONTRAST:  CT Cardiac 07/18/2012  COMPARISON:  Normal heart size. Normal caliber thoracic aorta. No evidence of aortic aneurysm. Great vessel origins are patent. Small esophageal hiatal hernia. Esophagus is decompressed. No significant lymphadenopathy in the chest. Sub cm nodules in the left lobe of the thyroid gland without overall thyroid enlargement. No pleural effusion.  Patchy focal areas of infiltration in the mid and lower lungs bilaterally likely represent areas of atelectasis or possibly focal pneumonitis. Airways appear patent. No discrete pulmonary nodules are demonstrated. Ovoid nodular opacity seen in the right upper chest on chest radiograph may have been extrinsic. Left scapula appears  intact without definite lesion identified.  At the upper abdominal images demonstrate probable subcentimeter cysts or hemangiomas in the liver. Prominence of the adrenal glands bilaterally may represent small adenomas or hyperplasia.  FINDINGS: Patchy focal areas of infiltration or atelectasis in the mid and lower lungs bilaterally. No discrete nodules or bone lesions appreciated.   Electronically Signed   By: Burman Nieves M.D.   On: 06/09/2014 00:58    EKG: Independently reviewed. sinus tachycardia. Assessment/Plan Principal Problem:   Chest pain Active Problems:   COPD (chronic obstructive pulmonary disease)   Fibromyalgia   Bipolar 1 disorder   GERD (gastroesophageal reflux disease)   1. Chest pain The patient is presenting with complaints of chest pain due to her pain appears to be more likely right. EKG and CT chest does not show any acute abnormality cardiac-wise. Initial troponin is also negative. We'll continue to follow serial troponin. D-dimer is mildly elevated CT chest has not commented about pulmonary embolism will check lower extremity Doppler. Limited echocardiogram in the morning.  2.Possible COPD exacerbation. Patient initially presented with shortness of breath cough and chest pain. She was having significant wheezing as per the ED physician. The time of my evaluation that since he has improved she continues to have decreased breath sound bilaterally. With this we will continue her on duo nebs, continue her levofloxacin, follow cultures, prednisone 40 mg daily.  3.Anxiety, fibromyalgia, Continue home medication.  4.Hypertension. Blood pressure at present stable. Continue home medication.  DVT Prophylaxis: subcutaneous Heparin Nutrition: Cardiac diet  Code Status: Full  Disposition: Admitted to observation in telemetry unit.  Author: Lynden Oxford, MD [facility] Pager: [phone] 06/09/2014, 6:10 AM    If 7PM-7AM, please contact  night-coverage [email or web address] Password [email or web address]  **Disclaimer: This note may have been dictated with voice recognition software. Similar sounding words can inadvertently be transcribed and this note may contain transcription errors which may not have been corrected upon publication of note.**

## 2014-06-09 NOTE — Progress Notes (Signed)
[facility] PROGRESS NOTE Interim History: a 63 y.o. female with Past medical history of COPD, hypertension, fibromyalgia, bipolar disorder, substance abuse with cocaine,  Comes in complaining of chest pain and shortness of breath with cough.    Assessment/Plan: Chest pain due to Acute bronchitis and/or COPD - Change to rocephin and azithro. - Cont ketoralac for pain. NO NARCOTICS - Afebrile, still SOB with rest. Currently no wheezing. - Change steroids to orals  Fibromyalgia - Continue home medication.  Bipolar 1 disorder - Continue home medication.  Hypertension.  Blood pressure at present stable     Code Status: Full  Disposition: Admitted to observation in telemetry unit.    Consultants:  none  Procedures:  CT chest  Antibiotics:  rocpehin and azithro  HPI/Subjective: Still coughing and SOB at rest. Also pleuritic CP.  Objective: Filed Vitals:   06/09/14 0330 06/09/14 0400 06/09/14 0430 06/09/14 0530  BP: 150/77 143/66 137/68 145/80  Pulse: 109 100 99 91  Temp:    97.8 F (36.6 C)  TempSrc:    Oral  Resp: Height:      Weight:      SpO2: 96% 96% 94% 98%   No intake or output data in the 24 hours ending 06/09/14 0802 Filed Weights   06/08/14 1832  Weight: 62.143 kg (137 lb)    Exam:  General: Alert, awake, oriented x3, in no acute distress.  HEENT: No bruits, no goiter.  Heart: Regular rate and rhythm. Lungs: moderate air movement, ronchi Abdomen: Soft, nontender, nondistended, positive bowel sounds.  Neuro: Grossly intact, nonfocal.   Data Reviewed: Basic Metabolic Panel:  Recent Labs Lab 06/08/14 1839  NA 140  K 4.0  CL 105  CO2 22  GLUCOSE 91  BUN 14  CREATININE 0.84  CALCIUM 8.8   Liver Function Tests: No results found for this basename: AST, ALT, ALKPHOS, BILITOT, PROT, ALBUMIN,  in the last 168 hours No results found for this basename: LIPASE, AMYLASE,  in the last 168 hours No results found  for this basename: AMMONIA,  in the last 168 hours CBC:  Recent Labs Lab 06/08/14 1839  WBC 5.4  HGB 12.6  HCT 36.2  MCV 93.3  PLT 264   Cardiac Enzymes:  Recent Labs Lab 06/09/14 0248 06/09/14 0612  TROPONINI <0.30 <0.30   BNP (last 3 results)  Recent Labs  06/08/14 1835  PROBNP 131.0*   CBG: No results found for this basename: GLUCAP,  in the last 168 hours  No results found for this or any previous visit (from the past 240 hour(s)).   Studies: Dg Chest 2 View (if Patient Has Fever And/or Copd)  06/08/2014   CLINICAL DATA:  Chest tightness shortness of breath cough history of COPD and hypertension  EXAM: CHEST  2 VIEW  COMPARISON:  04/12/2013  FINDINGS: Heart size and vascular pattern are normal. 1 cm rounded opacity right upper lobe. This could represent a snap or and EKG lead potentially. In the right lower lobe there is mild scarring or atelectasis. There are no pleural effusions. There is evidence of periosteal reaction and cortical irregularity involving the lateral infraglenoid scapula on the left. This was not present previously.  IMPRESSION: No acute findings. Recommend examining the patient to determine if rounded opacity projecting over right upper lobe may be external to the patient. If so, recommend removing the optic and repeating the PA radiograph. Otherwise consider CT thorax. However, CT scan may be most appropriate  given the possible bone lesion involving the left scapula, which should also be further evaluated.   Electronically Signed   By: Esperanza Heir M.D.   On: 06/08/2014 20:23   Ct Chest W Contrast  06/09/2014   CLINICAL DATA:  Shortness of breath and chest pain. Abnormal chest x-ray suggesting possible right apical nodule and scapular lesion on the left.  EXAM: CT CHEST WITH CONTRAST  TECHNIQUE: Multidetector CT imaging of the chest was performed during intravenous contrast administration.  CONTRAST:  CT Cardiac 07/18/2012  COMPARISON:  Normal heart  size. Normal caliber thoracic aorta. No evidence of aortic aneurysm. Great vessel origins are patent. Small esophageal hiatal hernia. Esophagus is decompressed. No significant lymphadenopathy in the chest. Sub cm nodules in the left lobe of the thyroid gland without overall thyroid enlargement. No pleural effusion.  Patchy focal areas of infiltration in the mid and lower lungs bilaterally likely represent areas of atelectasis or possibly focal pneumonitis. Airways appear patent. No discrete pulmonary nodules are demonstrated. Ovoid nodular opacity seen in the right upper chest on chest radiograph may have been extrinsic. Left scapula appears intact without definite lesion identified.  At the upper abdominal images demonstrate probable subcentimeter cysts or hemangiomas in the liver. Prominence of the adrenal glands bilaterally may represent small adenomas or hyperplasia.  FINDINGS: Patchy focal areas of infiltration or atelectasis in the mid and lower lungs bilaterally. No discrete nodules or bone lesions appreciated.   Electronically Signed   By: Burman Nieves M.D.   On: 06/09/2014 00:58    Scheduled Meds: . brimonidine  1 drop Both Eyes Daily   And  . timolol  1 drop Both Eyes Daily  . enoxaparin (LOVENOX) injection  40 mg Subcutaneous Q24H  . escitalopram  20 mg Oral QHS  . lisinopril  20 mg Oral Daily   And  . hydrochlorothiazide  12.5 mg Oral Daily  . ipratropium-albuterol  3 mL Nebulization Q4H  . latanoprost  2 drop Both Eyes QHS  . [START ON 06/10/2014] levofloxacin (LEVAQUIN) IV  750 mg Intravenous Q24H  . pantoprazole  40 mg Oral Daily  . predniSONE  40 mg Oral Q breakfast  . tiotropium  18 mcg Inhalation Daily  . ziprasidone  20 mg Oral QHS   Continuous Infusions: . sodium chloride 75 mL/hr (06/09/14 0336)     Marinda Elk  [facility] Pager [phone]. If 8PM-8AM, please contact night-coverage at [email or web address], password [facility] 06/09/2014, 8:02 AM  LOS: 1 day      **Disclaimer: This note may have been dictated with voice recognition software. Similar sounding words can inadvertently be transcribed and this note may contain transcription errors which may not have been corrected upon publication of note.**

## 2014-06-09 NOTE — Progress Notes (Signed)
63yo female c/o SOB, recently stopped smoking, no relief w/ inhalers at home, CT shows possible infiltrates, to begin IV ABX for CAP.  Will start Levaquin  IV Q24H for CrCl ~75ml/min and monitor CBC and Cx.  Vernard Gambles, PharmD, BCPS 06/09/2014 2:44 AM

## 2014-06-10 MED ORDER — ZOLPIDEM TARTRATE 10 MG PO TABS: 10.0000 mg | ORAL_TABLET | Freq: Every evening | ORAL | Status: DC | PRN

## 2014-06-10 MED ORDER — LEVOFLOXACIN 750 MG PO TABS
750.0000 mg | ORAL_TABLET | Freq: Once | ORAL | Status: AC
  Administered 2014-06-10: 750 mg via ORAL
  Filled 2014-06-10: qty 1

## 2014-06-10 MED ORDER — LEVOFLOXACIN 750 MG PO TABS: 750.0000 mg | ORAL_TABLET | Freq: Once | ORAL | Status: DC

## 2014-06-10 MED ORDER — PREDNISONE 20 MG PO TABS: 40.0000 mg | ORAL_TABLET | Freq: Every day | ORAL | Status: DC

## 2014-06-10 NOTE — Discharge Summary (Signed)
Physician Discharge Summary  Cassie Harrell ZOX:[id number] DOB: 1951/01/09 DOA: 06/08/2014  PCP: Dorrene German, MD  Admit date: 06/08/2014 Discharge date: 06/10/2014  Time spent: >45 minutes   Discharge Condition: stable Diet recommendation: heart healthy  Discharge Diagnoses:  Principal Problem:   Chest pain Active Problems:   COPD (chronic obstructive pulmonary disease)   Fibromyalgia   Bipolar 1 disorder   GERD (gastroesophageal reflux disease)   CAP (community acquired pneumonia)   History of present illness:  a 63 y.o. female with Past medical history of COPD, hypertension, fibromyalgia, bipolar disorder, substance abuse with cocaine. Presents to the ED with chest pain, shortness of breath with cough.    Hospital Course:  Chest pain due Community acquired pneumonia - cardiac enzymes x 3 sets negative - atypical pain likely related to cough from pneumonia- continues to have the pain but improved from admission - CT of the chest was negative for PE and revealed possible pneumonia - pt having symptoms of cough with yellow sputum starting a few days prior to admission - has received 3 days of antibiotics while hospitalized- will give another 7 days of Levaquin to complete a 10 day course  COPD exacerbation - was started on IV steroids and transitioned to orals and is quite stable - pulse ox 100% on room air - cont Pro-air and Spiriva at home - states she stopped smoking 2 wks ago - cont Prednisone for 3 more days  Cocaine abuse - advised that current pneumonia may be related to recent cocaine use- strictly advised to avoid using cocaine to prevent further health complications  Fibromyalgia  - Continue home medication.   Bipolar 1 disorder  - Continue home medication.   Hypertension Blood pressure at present stable    Discharge Exam: Filed Weights   06/08/14 1832  Weight: 62.143 kg (137 lb)   Filed Vitals:   06/10/14 0803  BP: 120/73  Pulse: 68   Temp: 98.5 F (36.9 C)  Resp:     General: AAO x 3, no distress Cardiovascular: RRR, no murmurs Respiratory: CTA b/l  GI: abdomen non-tender, BS+  Discharge Instructions You were cared for by a hospitalist during your hospital stay. If you have any questions about your discharge medications or the care you received while you were in the hospital after you are discharged, you can call the unit and asked to speak with the hospitalist on call if the hospitalist that took care of you is not available. Once you are discharged, your primary care physician will handle any further medical issues. Please note that NO REFILLS for any discharge medications will be authorized once you are discharged, as it is imperative that you return to your primary care physician (or establish a relationship with a primary care physician if you do not have one) for your aftercare needs so that they can reassess your need for medications and monitor your lab values.  Discharge Instructions   Diet - low sodium heart healthy    Complete by:  As directed      Increase activity slowly    Complete by:  As directed             Medication List         albuterol 108 (90 BASE) MCG/ACT inhaler  Commonly known as:  PROVENTIL HFA;VENTOLIN HFA  Inhale 2 puffs into the lungs every 6 (six) hours as needed. For wheezing     COMBIGAN 0.2-0.5 % ophthalmic solution  Generic drug:  brimonidine-timolol  Place 1 drop into both eyes every morning.     cyclobenzaprine 10 MG tablet  Commonly known as:  FLEXERIL  Take 10 mg by mouth at bedtime as needed for muscle spasms.     escitalopram 20 MG tablet  Commonly known as:  LEXAPRO  Take 20 mg by mouth at bedtime.     guaiFENesin 600 MG 12 hr tablet  Commonly known as:  MUCINEX  Take 600 mg by mouth 2 (two) times daily as needed for cough or to loosen phlegm.     ibuprofen 800 MG tablet  Commonly known as:  ADVIL,MOTRIN  Take 800 mg by mouth every 8 (eight) hours as  needed for moderate pain.     KLONOPIN 1 MG tablet  Generic drug:  clonazePAM  Take 2 mg by mouth 2 (two) times daily.     latanoprost 0.005 % ophthalmic solution  Commonly known as:  XALATAN  Place 2 drops into both eyes at bedtime.     levofloxacin 750 MG tablet  Commonly known as:  LEVAQUIN  Take 1 tablet (750 mg total) by mouth once.     lisinopril-hydrochlorothiazide 20-12.5 MG per tablet  Commonly known as:  PRINZIDE,ZESTORETIC  Take 1 tablet by mouth daily.     omeprazole 20 MG capsule  Commonly known as:  PRILOSEC  Take 40 mg by mouth every morning.     predniSONE 20 MG tablet  Commonly known as:  DELTASONE  Take 2 tablets (40 mg total) by mouth daily with breakfast.     tiotropium 18 MCG inhalation capsule  Commonly known as:  SPIRIVA  Place 18 mcg into inhaler and inhale daily.     ziprasidone 20 MG capsule  Commonly known as:  GEODON  Take 20 mg by mouth at bedtime.     zolpidem 10 MG tablet  Commonly known as:  AMBIEN  Take 1 tablet (10 mg total) by mouth at bedtime as needed for sleep.       Allergies  Allergen Reactions  . Codeine Hives and Nausea And Vomiting  . Imipramine Rash  . Meloxicam Itching      The results of significant diagnostics from this hospitalization (including imaging, microbiology, ancillary and laboratory) are listed below for reference.    Significant Diagnostic Studies: Dg Chest 2 View (if Patient Has Fever And/or Copd)  06/08/2014   CLINICAL DATA:  Chest tightness shortness of breath cough history of COPD and hypertension  EXAM: CHEST  2 VIEW  COMPARISON:  04/12/2013  FINDINGS: Heart size and vascular pattern are normal. 1 cm rounded opacity right upper lobe. This could represent a snap or and EKG lead potentially. In the right lower lobe there is mild scarring or atelectasis. There are no pleural effusions. There is evidence of periosteal reaction and cortical irregularity involving the lateral infraglenoid scapula on the  left. This was not present previously.  IMPRESSION: No acute findings. Recommend examining the patient to determine if rounded opacity projecting over right upper lobe may be external to the patient. If so, recommend removing the optic and repeating the PA radiograph. Otherwise consider CT thorax. However, CT scan may be most appropriate given the possible bone lesion involving the left scapula, which should also be further evaluated.   Electronically Signed   By: Esperanza Heir M.D.   On: 06/08/2014 20:23   Ct Chest W Contrast  06/09/2014   CLINICAL DATA:  Shortness of breath and chest pain. Abnormal chest x-ray suggesting possible right apical  nodule and scapular lesion on the left.  EXAM: CT CHEST WITH CONTRAST  TECHNIQUE: Multidetector CT imaging of the chest was performed during intravenous contrast administration.  CONTRAST:  CT Cardiac 07/18/2012  COMPARISON:  Normal heart size. Normal caliber thoracic aorta. No evidence of aortic aneurysm. Great vessel origins are patent. Small esophageal hiatal hernia. Esophagus is decompressed. No significant lymphadenopathy in the chest. Sub cm nodules in the left lobe of the thyroid gland without overall thyroid enlargement. No pleural effusion.  Patchy focal areas of infiltration in the mid and lower lungs bilaterally likely represent areas of atelectasis or possibly focal pneumonitis. Airways appear patent. No discrete pulmonary nodules are demonstrated. Ovoid nodular opacity seen in the right upper chest on chest radiograph may have been extrinsic. Left scapula appears intact without definite lesion identified.  At the upper abdominal images demonstrate probable subcentimeter cysts or hemangiomas in the liver. Prominence of the adrenal glands bilaterally may represent small adenomas or hyperplasia.  FINDINGS: Patchy focal areas of infiltration or atelectasis in the mid and lower lungs bilaterally. No discrete nodules or bone lesions appreciated.   Electronically  Signed   By: Burman Nieves M.D.   On: 06/09/2014 00:58    Microbiology: No results found for this or any previous visit (from the past 240 hour(s)).   Labs: Basic Metabolic Panel:  Recent Labs Lab 06/08/14 1839  NA 140  K 4.0  CL 105  CO2 22  GLUCOSE 91  BUN 14  CREATININE 0.84  CALCIUM 8.8   Liver Function Tests: No results found for this basename: AST, ALT, ALKPHOS, BILITOT, PROT, ALBUMIN,  in the last 168 hours No results found for this basename: LIPASE, AMYLASE,  in the last 168 hours No results found for this basename: AMMONIA,  in the last 168 hours CBC:  Recent Labs Lab 06/08/14 1839  WBC 5.4  HGB 12.6  HCT 36.2  MCV 93.3  PLT 264   Cardiac Enzymes:  Recent Labs Lab 06/09/14 0248 06/09/14 0612 06/09/14 0850  TROPONINI <0.30 <0.30 <0.30   BNP: BNP (last 3 results)  Recent Labs  06/08/14 1835  PROBNP 131.0*   CBG: No results found for this basename: GLUCAP,  in the last 168 hours     Signed:  Calvert Cantor, MD [facility] 06/10/2014, 9:11 AM

## 2014-12-27 ENCOUNTER — Encounter (HOSPITAL_COMMUNITY): Payer: Self-pay | Admitting: Emergency Medicine

## 2014-12-27 ENCOUNTER — Emergency Department (HOSPITAL_COMMUNITY): Payer: Medicare Other

## 2014-12-27 ENCOUNTER — Emergency Department (HOSPITAL_COMMUNITY)
Admission: EM | Admit: 2014-12-27 | Discharge: 2014-12-28 | Disposition: A | Discharge status: Home / Self Care | Payer: Medicare Other | Attending: Emergency Medicine | Admitting: Emergency Medicine

## 2014-12-27 DIAGNOSIS — F319 Bipolar disorder, unspecified: Secondary | ICD-10-CM | POA: Insufficient documentation

## 2014-12-27 DIAGNOSIS — Z720 Tobacco use: Secondary | ICD-10-CM | POA: Diagnosis not present

## 2014-12-27 DIAGNOSIS — K219 Gastro-esophageal reflux disease without esophagitis: Secondary | ICD-10-CM | POA: Diagnosis not present

## 2014-12-27 DIAGNOSIS — R079 Chest pain, unspecified: Secondary | ICD-10-CM | POA: Diagnosis present

## 2014-12-27 DIAGNOSIS — R05 Cough: Secondary | ICD-10-CM | POA: Diagnosis not present

## 2014-12-27 DIAGNOSIS — Z79899 Other long term (current) drug therapy: Secondary | ICD-10-CM | POA: Insufficient documentation

## 2014-12-27 DIAGNOSIS — I10 Essential (primary) hypertension: Secondary | ICD-10-CM | POA: Insufficient documentation

## 2014-12-27 DIAGNOSIS — J441 Chronic obstructive pulmonary disease with (acute) exacerbation: Secondary | ICD-10-CM | POA: Diagnosis not present

## 2014-12-27 DIAGNOSIS — Z8669 Personal history of other diseases of the nervous system and sense organs: Secondary | ICD-10-CM | POA: Diagnosis not present

## 2014-12-27 DIAGNOSIS — Z8739 Personal history of other diseases of the musculoskeletal system and connective tissue: Secondary | ICD-10-CM | POA: Insufficient documentation

## 2014-12-27 DIAGNOSIS — R0789 Other chest pain: Secondary | ICD-10-CM | POA: Diagnosis not present

## 2014-12-27 DIAGNOSIS — F419 Anxiety disorder, unspecified: Secondary | ICD-10-CM | POA: Insufficient documentation

## 2014-12-27 DIAGNOSIS — R110 Nausea: Secondary | ICD-10-CM | POA: Insufficient documentation

## 2014-12-27 LAB — COMPREHENSIVE METABOLIC PANEL
ALT: 14 U/L (ref 0–35)
AST: 21 U/L (ref 0–37)
Albumin: 3.8 g/dL (ref 3.5–5.2)
Alkaline Phosphatase: 52 U/L (ref 39–117)
Anion gap: 10 (ref 5–15)
BUN: 10 mg/dL (ref 6–23)
CALCIUM: 9.5 mg/dL (ref 8.4–10.5)
CHLORIDE: 105 mmol/L (ref 96–112)
CO2: 23 mmol/L (ref 19–32)
Creatinine, Ser: 1.05 mg/dL (ref 0.50–1.10)
GFR calc non Af Amer: 56 mL/min — ABNORMAL LOW (ref >90)
GFR, EST AFRICAN AMERICAN: 65 mL/min — AB (ref >90)
Glucose, Bld: 96 mg/dL (ref 70–99)
Potassium: 3.7 mmol/L (ref 3.5–5.1)
Sodium: 138 mmol/L (ref 135–145)
Total Bilirubin: 0.3 mg/dL (ref 0.3–1.2)
Total Protein: 6.5 g/dL (ref 6.0–8.3)

## 2014-12-27 LAB — CBC
HCT: 39.4 % (ref 36.0–46.0)
Hemoglobin: 13.6 g/dL (ref 12.0–15.0)
MCH: 32.4 pg (ref 26.0–34.0)
MCHC: 34.5 g/dL (ref 30.0–36.0)
MCV: 93.8 fL (ref 78.0–100.0)
Platelets: 281 10*3/uL (ref 150–400)
RBC: 4.2 MIL/uL (ref 3.87–5.11)
RDW: 13.9 % (ref 11.5–15.5)
WBC: 5.7 10*3/uL (ref 4.0–10.5)

## 2014-12-27 LAB — I-STAT CHEM 8, ED
BUN: 12 mg/dL (ref 6–23)
CREATININE: 1.1 mg/dL (ref 0.50–1.10)
Calcium, Ion: 1.24 mmol/L (ref 1.13–1.30)
Chloride: 104 mmol/L (ref 96–112)
Glucose, Bld: 89 mg/dL (ref 70–99)
HCT: 45 % (ref 36.0–46.0)
Hemoglobin: 15.3 g/dL — ABNORMAL HIGH (ref 12.0–15.0)
POTASSIUM: 3.7 mmol/L (ref 3.5–5.1)
Sodium: 140 mmol/L (ref 135–145)
TCO2: 20 mmol/L (ref 0–100)

## 2014-12-27 LAB — I-STAT TROPONIN, ED: Troponin i, poc: 0 ng/mL (ref 0.00–0.08)
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
## 2014-12-27 NOTE — ED Notes (Signed)
Patient states that she began having chest pain today around noon. She is having pain on inspiration, movement and it radiates to bilateral upper extremities, back and shoulders.

## 2014-12-28 ENCOUNTER — Emergency Department (HOSPITAL_COMMUNITY): Payer: Medicare Other

## 2014-12-28 ENCOUNTER — Encounter (HOSPITAL_COMMUNITY): Payer: Self-pay | Admitting: Radiology

## 2014-12-28 DIAGNOSIS — R0789 Other chest pain: Secondary | ICD-10-CM | POA: Diagnosis not present

## 2014-12-28 LAB — URINE MICROSCOPIC-ADD ON

## 2014-12-28 LAB — URINALYSIS, ROUTINE W REFLEX MICROSCOPIC
BILIRUBIN URINE: NEGATIVE
Glucose, UA: NEGATIVE mg/dL
Hgb urine dipstick: NEGATIVE
Ketones, ur: NEGATIVE mg/dL
NITRITE: NEGATIVE
Protein, ur: NEGATIVE mg/dL
Specific Gravity, Urine: 1.029 (ref 1.005–1.030)
UROBILINOGEN UA: 0.2 mg/dL (ref 0.0–1.0)
pH: 5 (ref 5.0–8.0)

## 2014-12-28 LAB — D-DIMER, QUANTITATIVE: D-Dimer, Quant: 0.69 ug/mL-FEU — ABNORMAL HIGH (ref 0.00–0.48)

## 2014-12-28 MED ORDER — HYDROMORPHONE HCL 1 MG/ML IJ SOLN
1.0000 mg | Freq: Once | INTRAMUSCULAR | Status: AC
  Administered 2014-12-28: 1 mg via INTRAVENOUS
  Filled 2014-12-28: qty 1

## 2014-12-28 MED ORDER — ZIPRASIDONE HCL 20 MG PO CAPS
20.0000 mg | ORAL_CAPSULE | Freq: Once | ORAL | Status: AC
Start: 2014-12-28 — End: 2014-12-28
  Administered 2014-12-28: 20 mg via ORAL
  Filled 2014-12-28: qty 1

## 2014-12-28 MED ORDER — ONDANSETRON HCL 4 MG/2ML IJ SOLN
4.0000 mg | Freq: Once | INTRAMUSCULAR | Status: AC
  Administered 2014-12-28: 4 mg via INTRAVENOUS
  Filled 2014-12-28: qty 2

## 2014-12-28 MED ORDER — IOHEXOL 350 MG/ML SOLN
80.0000 mL | Freq: Once | INTRAVENOUS | Status: AC | PRN
  Administered 2014-12-28: 90 mL via INTRAVENOUS

## 2014-12-28 MED ORDER — CLONAZEPAM 0.5 MG PO TABS
2.0000 mg | ORAL_TABLET | Freq: Once | ORAL | Status: AC
  Administered 2014-12-28: 2 mg via ORAL
  Filled 2014-12-28: qty 4

## 2014-12-28 MED ORDER — ESCITALOPRAM OXALATE 10 MG PO TABS
20.0000 mg | ORAL_TABLET | Freq: Once | ORAL | Status: AC
  Administered 2014-12-28: 20 mg via ORAL
  Filled 2014-12-28: qty 2

## 2014-12-28 NOTE — Discharge Instructions (Signed)

## 2014-12-28 NOTE — ED Provider Notes (Signed)
CSN: [id number]     Arrival date & time 12/27/14  2122 History   First MD Initiated Contact with Patient 12/28/14 0004     Chief Complaint  Patient presents with  . Chest Pain     (Consider location/radiation/quality/duration/timing/severity/associated sxs/prior Treatment) Patient is a 64 y.o. female presenting with chest pain. The history is provided by the patient.  Chest Pain Pain location:  L chest and substernal area Pain quality: sharp, shooting and stabbing   Pain radiates to:  Does not radiate Pain radiates to the back: no   Pain severity:  Severe Onset quality:  Sudden Duration:  12 hours Timing:  Constant Progression:  Worsening Chronicity:  New Context: breathing and movement   Context comment:  Pt has had a cough for the last few weeks but today pain developed spontaneously.  also notes she relapsed on cocaine recently Relieved by:  Nothing Worsened by:  Coughing, deep breathing and movement Ineffective treatments: ibuprofen. Associated symptoms: anxiety, cough, nausea and shortness of breath   Associated symptoms: no abdominal pain, no altered mental status, no fever, no syncope and not vomiting   Risk factors: hypertension and smoking   Risk factors: no diabetes mellitus, no immobilization and no surgery   Risk factors comment:  Cocaine abuse and COPD   Past Medical History  Diagnosis Date  . COPD (chronic obstructive pulmonary disease)   . Hypertension   . Osteoarthritis   . Bipolar 1 disorder   . Glaucoma   . Sleep apnea   . Cocaine abuse   . GERD (gastroesophageal reflux disease)   . Neuropathy of foot     bilateral   Past Surgical History  Procedure Laterality Date  . Abdominal hysterectomy    . Cesarean section    . Eye surgery    . Revision of scar tissue rectus muscle    . Uvulopalatopharyngoplasty    . Appendectomy    . Finger surgery     Family History  Problem Relation Age of Onset  . Other Mother     natural  . Heart attack Father    . Other Sister     kidney transplant  . Alcoholism Brother   . Goiter Sister   . Diabetes Brother   . Diabetes Sister    History  Substance Use Topics  . Smoking status: Current Every Day Smoker -- 0.50 packs/day for 20 years    Types: Cigarettes  . Smokeless tobacco: Never Used  . Alcohol Use: Yes     Comment: 1-2 cans of beer weekly   OB History    Gravida Para Term Preterm AB TAB SAB Ectopic Multiple Living   1              Review of Systems  Constitutional: Negative for fever.  Respiratory: Positive for cough and shortness of breath.   Cardiovascular: Positive for chest pain. Negative for syncope.  Gastrointestinal: Positive for nausea. Negative for vomiting and abdominal pain.  All other systems reviewed and are negative.     Allergies  Codeine; Imipramine; and Meloxicam  Home Medications   Prior to Admission medications   Medication Sig Start Date End Date Taking? Authorizing Provider  albuterol (PROVENTIL HFA;VENTOLIN HFA) 108 (90 BASE) MCG/ACT inhaler Inhale 2 puffs into the lungs every 6 (six) hours as needed. For wheezing   Yes Historical Provider, MD  brimonidine-timolol (COMBIGAN) 0.2-0.5 % ophthalmic solution Place 1 drop into both eyes every morning.   Yes Historical Provider, MD  clonazePAM (  KLONOPIN) 1 MG tablet Take 2 mg by mouth 2 (two) times daily.    Yes Historical Provider, MD  escitalopram (LEXAPRO) 20 MG tablet Take 20 mg by mouth at bedtime.    Yes Historical Provider, MD  guaiFENesin (MUCINEX) 600 MG 12 hr tablet Take 600 mg by mouth 2 (two) times daily as needed for cough or to loosen phlegm.   Yes Historical Provider, MD  ibuprofen (ADVIL,MOTRIN) 800 MG tablet Take 800 mg by mouth every 8 (eight) hours as needed for moderate pain.   Yes Historical Provider, MD  latanoprost (XALATAN) 0.005 % ophthalmic solution Place 2 drops into both eyes at bedtime.   Yes Historical Provider, MD  levofloxacin (LEVAQUIN) 750 MG tablet Take 1 tablet (750 mg  total) by mouth once. Patient not taking: Reported on 12/27/2014 06/10/14   Calvert CantorSaima Rizwan, MD  lisinopril-hydrochlorothiazide (PRINZIDE,ZESTORETIC) 20-12.5 MG per tablet Take 1 tablet by mouth daily.   Yes Historical Provider, MD  omeprazole (PRILOSEC) 20 MG capsule Take 40 mg by mouth every morning.    Yes Historical Provider, MD  polyethylene glycol (MIRALAX / GLYCOLAX) packet Take 17 g by mouth daily as needed for mild constipation.   Yes Historical Provider, MD  predniSONE (DELTASONE) 20 MG tablet Take 2 tablets (40 mg total) by mouth daily with breakfast. Patient not taking: Reported on 12/27/2014 06/10/14   Calvert CantorSaima Rizwan, MD  tiotropium (SPIRIVA) 18 MCG inhalation capsule Place 18 mcg into inhaler and inhale daily.     Yes Historical Provider, MD  tiZANidine (ZANAFLEX) 4 MG tablet Take 4 mg by mouth 2 (two) times daily.   Yes Historical Provider, MD  ziprasidone (GEODON) 20 MG capsule Take 20 mg by mouth at bedtime.   Yes Historical Provider, MD  zolpidem (AMBIEN) 10 MG tablet Take 1 tablet (10 mg total) by mouth at bedtime as needed for sleep. 06/10/14 07/10/14  Calvert CantorSaima Rizwan, MD   BP 139/83 mmHg  Pulse 74  Temp(Src) 98.1 F (36.7 C) (Oral)  Resp 22  Ht 5\' 3"  (1.6 m)  Wt 144 lb (65.318 kg)  BMI 25.51 kg/m2  SpO2 100% Physical Exam  Constitutional: She is oriented to person, place, and time. She appears well-developed and well-nourished. No distress.  HENT:  Head: Normocephalic and atraumatic.  Mouth/Throat: Oropharynx is clear and moist.  Eyes: Conjunctivae and EOM are normal. Pupils are equal, round, and reactive to light.  Neck: Normal range of motion. Neck supple.  Cardiovascular: Normal rate, regular rhythm and intact distal pulses.   No murmur heard. Pulmonary/Chest: Effort normal and breath sounds normal. No respiratory distress. She has no wheezes. She has no rales. She exhibits no tenderness.  Abdominal: Soft. She exhibits no distension. There is no tenderness. There is no  rebound and no guarding.  Musculoskeletal: Normal range of motion. She exhibits no edema or tenderness.  No calf pain or swelling  Neurological: She is alert and oriented to person, place, and time.  Skin: Skin is warm and dry. No rash noted. No erythema.  Psychiatric: She has a normal mood and affect. Her behavior is normal.  Nursing note and vitals reviewed.   ED Course  Procedures (including critical care time) Labs Review Labs Reviewed  COMPREHENSIVE METABOLIC PANEL - Abnormal; Notable for the following:    GFR calc non Af Amer 56 (*)    GFR calc Af Amer 65 (*)    All other components within normal limits  D-DIMER, QUANTITATIVE - Abnormal; Notable for the following:  D-Dimer, Quant 0.69 (*)    All other components within normal limits  I-STAT CHEM 8, ED - Abnormal; Notable for the following:    Hemoglobin 15.3 (*)    All other components within normal limits  CBC  URINALYSIS, ROUTINE W REFLEX MICROSCOPIC  I-STAT TROPOININ, ED    Imaging Review Dg Chest 2 View  12/27/2014   CLINICAL DATA:  Chest pain around noon today.  EXAM: CHEST  2 VIEW  COMPARISON:  CT 06/09/2014  FINDINGS: The heart size and mediastinal contours are within normal limits. Both lungs are clear. The visualized skeletal structures are unremarkable.  IMPRESSION: No active cardiopulmonary disease.   Electronically Signed   By: Ellery Plunk M.D.   On: 12/27/2014 22:12     EKG Interpretation   Date/Time:  Sunday December 27 2014 21:28:06 EDT Ventricular Rate:  81 PR Interval:  142 QRS Duration: 102 QT Interval:  390 QTC Calculation: 453 R Axis:   5 Text Interpretation:  Normal sinus rhythm Normal ECG No significant change  since last tracing Confirmed by Anitra Lauth  MD, Alphonzo Lemmings ([id number]) on 12/27/2014  11:50:44 PM      MDM   Final diagnoses:  None    Patient with a history of bipolar disease, cocaine abuse, COPD, hypertension presenting with pleuritic type chest pain that started around 12 PM  today. The pain is worse with deep breaths, coughing and moving her upper extremities. On exam patient has no wheezing and has normal breath sounds. EKG is within normal limits. Patient had a CBC, troponin, CMP done in triage all which were within normal limits. Low suspicion for cardiac etiology at this time given patient's history is very unlikely to be cardiac as well as a negative troponin after 12 hours of persistent pain. Chest x-ray without evidence of pneumothorax or pneumonia. Patient has had a dry cough but denies any fever and is afebrile here with a normal white count. Low suspicion for infection at this time. Patient does use cocaine as she recently has relapsed which could be a cause for her chest pain also concern for a PE however patient is low risk [place] and d-dimer is pending. Patient given medication for her pain. Feel most likely this is chest wall pain.  1:16 AM D-dimer elevated.  CT PE pending.  Pt checked otu to Dr. Freida Busman at [facility]  Gwyneth Sprout, MD 12/28/14 [phone]

## 2014-12-28 NOTE — ED Provider Notes (Signed)
Patient signed out to me by Dr. Anitra LauthPlunkett and chest CT negative for PE. Patient stable for discharge  Cassie NickAnthony Tyliek Timberman, MD 12/28/14 0400

## 2015-01-14 ENCOUNTER — Other Ambulatory Visit: Payer: Self-pay

## 2015-01-14 DIAGNOSIS — Z1231 Encounter for screening mammogram for malignant neoplasm of breast: Secondary | ICD-10-CM

## 2015-02-11 ENCOUNTER — Ambulatory Visit
Admission: RE | Admit: 2015-02-11 | Discharge: 2015-02-11 | Disposition: A | Discharge status: Home / Self Care | Payer: Medicare Other | Source: Ambulatory Visit

## 2015-02-11 DIAGNOSIS — Z1231 Encounter for screening mammogram for malignant neoplasm of breast: Secondary | ICD-10-CM

## 2015-02-23 ENCOUNTER — Emergency Department (HOSPITAL_COMMUNITY)
Admission: EM | Admit: 2015-02-23 | Discharge: 2015-02-23 | Disposition: A | Discharge status: Home / Self Care | Payer: No Typology Code available for payment source | Attending: Emergency Medicine | Admitting: Emergency Medicine

## 2015-02-23 ENCOUNTER — Encounter (HOSPITAL_COMMUNITY): Payer: Self-pay | Admitting: Emergency Medicine

## 2015-02-23 DIAGNOSIS — Z7982 Long term (current) use of aspirin: Secondary | ICD-10-CM | POA: Diagnosis not present

## 2015-02-23 DIAGNOSIS — Z720 Tobacco use: Secondary | ICD-10-CM | POA: Diagnosis not present

## 2015-02-23 DIAGNOSIS — M1990 Unspecified osteoarthritis, unspecified site: Secondary | ICD-10-CM | POA: Diagnosis not present

## 2015-02-23 DIAGNOSIS — Z79899 Other long term (current) drug therapy: Secondary | ICD-10-CM | POA: Insufficient documentation

## 2015-02-23 DIAGNOSIS — Z8669 Personal history of other diseases of the nervous system and sense organs: Secondary | ICD-10-CM | POA: Diagnosis not present

## 2015-02-23 DIAGNOSIS — R102 Pelvic and perineal pain: Secondary | ICD-10-CM | POA: Diagnosis not present

## 2015-02-23 DIAGNOSIS — J449 Chronic obstructive pulmonary disease, unspecified: Secondary | ICD-10-CM | POA: Insufficient documentation

## 2015-02-23 DIAGNOSIS — T7691XA Unspecified adult maltreatment, suspected, initial encounter: Secondary | ICD-10-CM | POA: Diagnosis not present

## 2015-02-23 DIAGNOSIS — I10 Essential (primary) hypertension: Secondary | ICD-10-CM | POA: Insufficient documentation

## 2015-02-23 DIAGNOSIS — T7421XA Adult sexual abuse, confirmed, initial encounter: Secondary | ICD-10-CM | POA: Insufficient documentation

## 2015-02-23 DIAGNOSIS — Z7951 Long term (current) use of inhaled steroids: Secondary | ICD-10-CM | POA: Diagnosis not present

## 2015-02-23 DIAGNOSIS — K219 Gastro-esophageal reflux disease without esophagitis: Secondary | ICD-10-CM | POA: Diagnosis not present

## 2015-02-23 DIAGNOSIS — F319 Bipolar disorder, unspecified: Secondary | ICD-10-CM | POA: Diagnosis not present

## 2015-02-23 HISTORY — DX: Dissociative and conversion disorder, unspecified: F44.9

## 2015-02-23 HISTORY — DX: Post-traumatic stress disorder, unspecified: F43.10

## 2015-02-23 LAB — WET PREP, GENITAL
Clue Cells Wet Prep HPF POC: NONE SEEN
Trich, Wet Prep: NONE SEEN
Yeast Wet Prep HPF POC: NONE SEEN

## 2015-02-23 LAB — HEPATITIS PANEL, ACUTE
HCV AB: NEGATIVE
HEP B S AG: NEGATIVE
Hep A IgM: NONREACTIVE
Hep B C IgM: NONREACTIVE

## 2015-02-23 LAB — RAPID HIV SCREEN (HIV 1/2 AB+AG)
HIV 1/2 Antibodies: NONREACTIVE
HIV-1 P24 Antigen - HIV24: NONREACTIVE

## 2015-02-23 MED ORDER — IBUPROFEN 800 MG PO TABS
800.0000 mg | ORAL_TABLET | Freq: Once | ORAL | Status: AC
  Administered 2015-02-23: 800 mg via ORAL
  Filled 2015-02-23: qty 1

## 2015-02-23 MED ORDER — TIZANIDINE HCL 4 MG PO CAPS: 4.0000 mg | ORAL_CAPSULE | Freq: Three times a day (TID) | ORAL | Status: DC

## 2015-02-23 MED ORDER — EMTRICITABINE-TENOFOVIR DF 200-300 MG PO TABS: 1.0000 | ORAL_TABLET | Freq: Every day | ORAL | Status: DC

## 2015-02-23 MED ORDER — CEFIXIME 400 MG PO TABS
400.0000 mg | ORAL_TABLET | Freq: Once | ORAL | Status: AC
  Administered 2015-02-23: 400 mg via ORAL
  Filled 2015-02-23: qty 1

## 2015-02-23 MED ORDER — RALTEGRAVIR POTASSIUM 400 MG PO TABS: 400.0000 mg | ORAL_TABLET | Freq: Two times a day (BID) | ORAL | Status: DC

## 2015-02-23 MED ORDER — RALTEGRAVIR POTASSIUM 400 MG PO TABS
400.0000 mg | ORAL_TABLET | Freq: Two times a day (BID) | ORAL | Status: DC
  Administered 2015-02-23: 400 mg via ORAL
  Filled 2015-02-23 (×2): qty 1

## 2015-02-23 MED ORDER — EMTRICITABINE-TENOFOVIR DF 200-300 MG PO TABS
1.0000 | ORAL_TABLET | Freq: Every day | ORAL | Status: DC
  Administered 2015-02-23: 1 via ORAL
  Filled 2015-02-23: qty 1

## 2015-02-23 MED ORDER — AZITHROMYCIN 250 MG PO TABS
1000.0000 mg | ORAL_TABLET | Freq: Once | ORAL | Status: AC
  Administered 2015-02-23: 1000 mg via ORAL
  Filled 2015-02-23: qty 4
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
## 2015-02-23 NOTE — ED Notes (Signed)
MD at bedside. 
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
## 2015-02-23 NOTE — ED Notes (Signed)
SANE RN at bedside.
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
## 2015-02-23 NOTE — ED Notes (Signed)
[employer] office at bedside.
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
## 2015-02-23 NOTE — ED Notes (Signed)
SANE RN, Annice PihJackie has been made aware of pt's case and stated that she will be here in about 45mins.

## 2015-02-23 NOTE — Progress Notes (Addendum)
1222 Cm consulted with CM lead who states no CM program referred to Infectious disease program and this ED Cm confirmed via EPIC that this pt has united health care insurance coverage as verified by registration in EPIC.  [facility] is not eligible for the [facility]  Left Forensic RN a voice message with update from CM lead and referred to infectious disease clinic or [facility]  1215 ED CM received a call from [place] about assistance with HIV prophylaxis CM left message for CM supervisor. Pending return call

## 2015-02-24 LAB — GC/CHLAMYDIA PROBE AMP (~~LOC~~) NOT AT ARMC
Chlamydia: NEGATIVE
Neisseria Gonorrhea: NEGATIVE

## 2015-03-03 DIAGNOSIS — J449 Chronic obstructive pulmonary disease, unspecified: Secondary | ICD-10-CM | POA: Diagnosis not present

## 2015-03-03 DIAGNOSIS — I10 Essential (primary) hypertension: Secondary | ICD-10-CM | POA: Diagnosis not present

## 2015-03-03 DIAGNOSIS — G4730 Sleep apnea, unspecified: Secondary | ICD-10-CM | POA: Diagnosis not present

## 2015-03-03 DIAGNOSIS — F419 Anxiety disorder, unspecified: Secondary | ICD-10-CM | POA: Diagnosis not present

## 2015-03-03 DIAGNOSIS — S3690XD Unspecified injury of unspecified intra-abdominal organ, subsequent encounter: Secondary | ICD-10-CM | POA: Diagnosis not present

## 2015-03-11 DIAGNOSIS — R1313 Dysphagia, pharyngeal phase: Secondary | ICD-10-CM | POA: Diagnosis not present

## 2015-04-06 DIAGNOSIS — I10 Essential (primary) hypertension: Secondary | ICD-10-CM | POA: Diagnosis not present

## 2015-04-06 DIAGNOSIS — K219 Gastro-esophageal reflux disease without esophagitis: Secondary | ICD-10-CM | POA: Diagnosis not present

## 2015-04-06 DIAGNOSIS — S7490XA Injury of unspecified nerve at hip and thigh level, unspecified leg, initial encounter: Secondary | ICD-10-CM | POA: Diagnosis not present

## 2015-04-06 DIAGNOSIS — G894 Chronic pain syndrome: Secondary | ICD-10-CM | POA: Diagnosis not present

## 2015-05-05 DIAGNOSIS — G894 Chronic pain syndrome: Secondary | ICD-10-CM | POA: Diagnosis not present

## 2015-05-05 DIAGNOSIS — Z124 Encounter for screening for malignant neoplasm of cervix: Secondary | ICD-10-CM | POA: Diagnosis not present

## 2015-05-05 DIAGNOSIS — N760 Acute vaginitis: Secondary | ICD-10-CM | POA: Diagnosis not present

## 2015-06-28 DIAGNOSIS — H4011X2 Primary open-angle glaucoma, moderate stage: Secondary | ICD-10-CM | POA: Diagnosis not present

## 2015-07-07 ENCOUNTER — Emergency Department (HOSPITAL_COMMUNITY)
Admission: EM | Admit: 2015-07-07 | Discharge: 2015-07-07 | Disposition: A | Discharge status: Home / Self Care | Payer: Medicare Other | Attending: Emergency Medicine | Admitting: Emergency Medicine

## 2015-07-07 ENCOUNTER — Emergency Department (HOSPITAL_COMMUNITY): Payer: Medicare Other

## 2015-07-07 ENCOUNTER — Encounter (HOSPITAL_COMMUNITY): Payer: Self-pay | Admitting: Family Medicine

## 2015-07-07 ENCOUNTER — Emergency Department (EMERGENCY_DEPARTMENT_HOSPITAL)
Admit: 2015-07-07 | Discharge: 2015-07-07 | Disposition: A | Discharge status: Home / Self Care | Payer: Medicare Other | Attending: Emergency Medicine | Admitting: Emergency Medicine

## 2015-07-07 DIAGNOSIS — S7011XA Contusion of right thigh, initial encounter: Secondary | ICD-10-CM | POA: Insufficient documentation

## 2015-07-07 DIAGNOSIS — M79661 Pain in right lower leg: Secondary | ICD-10-CM | POA: Diagnosis not present

## 2015-07-07 DIAGNOSIS — Z7982 Long term (current) use of aspirin: Secondary | ICD-10-CM | POA: Diagnosis not present

## 2015-07-07 DIAGNOSIS — M25561 Pain in right knee: Secondary | ICD-10-CM | POA: Diagnosis not present

## 2015-07-07 DIAGNOSIS — W1839XA Other fall on same level, initial encounter: Secondary | ICD-10-CM | POA: Diagnosis not present

## 2015-07-07 DIAGNOSIS — Y999 Unspecified external cause status: Secondary | ICD-10-CM | POA: Insufficient documentation

## 2015-07-07 DIAGNOSIS — H409 Unspecified glaucoma: Secondary | ICD-10-CM | POA: Diagnosis not present

## 2015-07-07 DIAGNOSIS — M1990 Unspecified osteoarthritis, unspecified site: Secondary | ICD-10-CM | POA: Insufficient documentation

## 2015-07-07 DIAGNOSIS — R52 Pain, unspecified: Secondary | ICD-10-CM

## 2015-07-07 DIAGNOSIS — Z79899 Other long term (current) drug therapy: Secondary | ICD-10-CM | POA: Insufficient documentation

## 2015-07-07 DIAGNOSIS — Y929 Unspecified place or not applicable: Secondary | ICD-10-CM | POA: Insufficient documentation

## 2015-07-07 DIAGNOSIS — J449 Chronic obstructive pulmonary disease, unspecified: Secondary | ICD-10-CM | POA: Diagnosis not present

## 2015-07-07 DIAGNOSIS — S8001XA Contusion of right knee, initial encounter: Secondary | ICD-10-CM | POA: Insufficient documentation

## 2015-07-07 DIAGNOSIS — M25551 Pain in right hip: Secondary | ICD-10-CM | POA: Diagnosis not present

## 2015-07-07 DIAGNOSIS — Z720 Tobacco use: Secondary | ICD-10-CM | POA: Insufficient documentation

## 2015-07-07 DIAGNOSIS — M7989 Other specified soft tissue disorders: Secondary | ICD-10-CM | POA: Diagnosis not present

## 2015-07-07 DIAGNOSIS — K219 Gastro-esophageal reflux disease without esophagitis: Secondary | ICD-10-CM | POA: Insufficient documentation

## 2015-07-07 DIAGNOSIS — S8991XA Unspecified injury of right lower leg, initial encounter: Secondary | ICD-10-CM | POA: Diagnosis present

## 2015-07-07 DIAGNOSIS — F319 Bipolar disorder, unspecified: Secondary | ICD-10-CM | POA: Diagnosis not present

## 2015-07-07 DIAGNOSIS — Y939 Activity, unspecified: Secondary | ICD-10-CM | POA: Insufficient documentation

## 2015-07-07 DIAGNOSIS — S79911A Unspecified injury of right hip, initial encounter: Secondary | ICD-10-CM | POA: Diagnosis not present

## 2015-07-07 DIAGNOSIS — W19XXXA Unspecified fall, initial encounter: Secondary | ICD-10-CM

## 2015-07-07 DIAGNOSIS — I10 Essential (primary) hypertension: Secondary | ICD-10-CM | POA: Diagnosis not present

## 2015-07-07 MED ORDER — HYDROCODONE-ACETAMINOPHEN 5-325 MG PO TABS
1.0000 | ORAL_TABLET | Freq: Once | ORAL | Status: AC
  Administered 2015-07-07: 1 via ORAL
  Filled 2015-07-07: qty 1

## 2015-07-07 NOTE — Progress Notes (Signed)
*  Preliminary Results* Right lower extremity venous duplex completed. Right lower extremity is negative for deep vein thrombosis. 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 There is no evidence of right Baker's cyst.  Preliminary results discussed with Lorelle Formosa.  07/07/2015 7:41 PM  Gertie Fey, RVT, RDCS, RDMS
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
## 2015-07-07 NOTE — ED Provider Notes (Signed)
  Physical Exam  BP 131/70 mmHg  Pulse 73  Temp(Src) 98.3 F (36.8 C) (Oral)  Resp 18  Ht  (1.6 m)  Wt 146 lb (66.225 kg)  BMI 25.87 kg/m2  SpO2 100%  Physical Exam Mild swelling and ecchymosis of the right knee, popliteal fossa, and proximal tibia and fibula. No deformity. She is able to flex and extend the knee. ED Course  Procedures This patient was signed out to me by Eyvonne Mechanic, PA-C. Patient presents for right knee pain after a fall one week ago and is complaining of swelling and possible blood clot in the leg. She was given Norco for pain in the ED. X-rays and ultrasound are pending. Plan is to discharge the patient is x-rays and ultrasound are negative.  [place] is negative for DVT but does show a thrombosed varicose vein. X-ray of the tib-fib and hip are negative for acute fracture or dislocation. X-ray of the right knee shows soft tissue swelling but no acute fracture. Patient was put in Ace bandage. I discussed RICE.  I discussed taking naproxen for pain. She states she has hydrocodone at home for pain. I gave her follow-up with vascular surgery for varicosity.  She verbally agrees with the plan.      Catha Gosselin, PA-C 07/07/15 1958  Mirian Mo, MD 07/08/15 [phone]
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
## 2015-07-07 NOTE — ED Notes (Signed)
Pt here for right knee pain after a fall a week ago. Right knee swollen and bruising around her knee. sts also right hip pain.
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
## 2015-07-13 DIAGNOSIS — J449 Chronic obstructive pulmonary disease, unspecified: Secondary | ICD-10-CM | POA: Diagnosis not present

## 2015-07-13 DIAGNOSIS — M25569 Pain in unspecified knee: Secondary | ICD-10-CM | POA: Diagnosis not present

## 2015-07-13 DIAGNOSIS — K219 Gastro-esophageal reflux disease without esophagitis: Secondary | ICD-10-CM | POA: Diagnosis not present

## 2015-07-13 DIAGNOSIS — I872 Venous insufficiency (chronic) (peripheral): Secondary | ICD-10-CM | POA: Diagnosis not present

## 2015-07-13 DIAGNOSIS — I10 Essential (primary) hypertension: Secondary | ICD-10-CM | POA: Diagnosis not present

## 2015-09-17 ENCOUNTER — Encounter (HOSPITAL_COMMUNITY): Payer: Self-pay | Admitting: Emergency Medicine

## 2015-09-17 ENCOUNTER — Emergency Department (HOSPITAL_COMMUNITY)
Admission: EM | Admit: 2015-09-17 | Discharge: 2015-09-17 | Disposition: A | Discharge status: Home / Self Care | Payer: Medicare Other | Attending: Emergency Medicine | Admitting: Emergency Medicine

## 2015-09-17 DIAGNOSIS — J449 Chronic obstructive pulmonary disease, unspecified: Secondary | ICD-10-CM | POA: Insufficient documentation

## 2015-09-17 DIAGNOSIS — Y9289 Other specified places as the place of occurrence of the external cause: Secondary | ICD-10-CM | POA: Diagnosis not present

## 2015-09-17 DIAGNOSIS — F449 Dissociative and conversion disorder, unspecified: Secondary | ICD-10-CM | POA: Diagnosis not present

## 2015-09-17 DIAGNOSIS — Y998 Other external cause status: Secondary | ICD-10-CM | POA: Insufficient documentation

## 2015-09-17 DIAGNOSIS — S00412A Abrasion of left ear, initial encounter: Secondary | ICD-10-CM | POA: Insufficient documentation

## 2015-09-17 DIAGNOSIS — Z7982 Long term (current) use of aspirin: Secondary | ICD-10-CM | POA: Insufficient documentation

## 2015-09-17 DIAGNOSIS — F319 Bipolar disorder, unspecified: Secondary | ICD-10-CM | POA: Diagnosis not present

## 2015-09-17 DIAGNOSIS — K219 Gastro-esophageal reflux disease without esophagitis: Secondary | ICD-10-CM | POA: Insufficient documentation

## 2015-09-17 DIAGNOSIS — H409 Unspecified glaucoma: Secondary | ICD-10-CM | POA: Insufficient documentation

## 2015-09-17 DIAGNOSIS — F4310 Post-traumatic stress disorder, unspecified: Secondary | ICD-10-CM | POA: Diagnosis not present

## 2015-09-17 DIAGNOSIS — F17210 Nicotine dependence, cigarettes, uncomplicated: Secondary | ICD-10-CM | POA: Insufficient documentation

## 2015-09-17 DIAGNOSIS — X58XXXA Exposure to other specified factors, initial encounter: Secondary | ICD-10-CM | POA: Diagnosis not present

## 2015-09-17 DIAGNOSIS — I10 Essential (primary) hypertension: Secondary | ICD-10-CM | POA: Diagnosis not present

## 2015-09-17 DIAGNOSIS — Y9389 Activity, other specified: Secondary | ICD-10-CM | POA: Insufficient documentation

## 2015-09-17 DIAGNOSIS — M1990 Unspecified osteoarthritis, unspecified site: Secondary | ICD-10-CM | POA: Diagnosis not present

## 2015-09-17 DIAGNOSIS — T162XXA Foreign body in left ear, initial encounter: Secondary | ICD-10-CM | POA: Diagnosis present

## 2015-09-17 DIAGNOSIS — Z8669 Personal history of other diseases of the nervous system and sense organs: Secondary | ICD-10-CM | POA: Insufficient documentation

## 2015-09-17 DIAGNOSIS — Z79899 Other long term (current) drug therapy: Secondary | ICD-10-CM | POA: Diagnosis not present

## 2015-09-17 NOTE — ED Notes (Signed)
From home for eval of foreign body in ear, pt states she was scratching her ear when she felt her nail break in her ear. No foreign body seen at this time. No other complaints.

## 2015-09-17 NOTE — Discharge Instructions (Signed)
If your ear becomes painful, begins draining. Please follow up with Dr. Jenne PaneBates or return to the Emergency department

## 2015-09-17 NOTE — ED Notes (Signed)
See pa note.  

## 2015-09-17 NOTE — ED Provider Notes (Signed)
CSN: [id number]     Arrival date & time 09/17/15  1220 History  By signing my name below, I, Freida Busmaniana Omoyeni, attest that this documentation has been prepared under the direction and in the presence of non-physician practitioner, Arthor CaptainAbigail Faithann Natal, PA-C. Electronically Signed: Freida Busmaniana Omoyeni, Scribe. 09/17/2015. 12:55 PM.     Chief Complaint  Patient presents with  . Foreign Body in Ear   The history is provided by the patient. No language interpreter was used.     HPI Comments:  Cassie Harrell is a 64 y.o. female who presents to the Emergency Department complaining of a foreign body in the left ear. Pt states her ear was itching last night so she stuck her finger nail in  The ear and it broke off. She then took a hair pin to pull it out and may have pushed in in further. No alleviating factors noted.  Past Medical History  Diagnosis Date  . COPD (chronic obstructive pulmonary disease) (HCC)   . Hypertension   . Osteoarthritis   . Bipolar 1 disorder (HCC)   . Glaucoma   . Sleep apnea   . Cocaine abuse   . GERD (gastroesophageal reflux disease)   . Neuropathy of foot     bilateral  . PTSD (post-traumatic stress disorder)   . Dissociative disorder    Past Surgical History  Procedure Laterality Date  . Abdominal hysterectomy    . Cesarean section    . Eye surgery    . Revision of scar tissue rectus muscle    . Uvulopalatopharyngoplasty    . Appendectomy    . Finger surgery     Family History  Problem Relation Age of Onset  . Other Mother     natural  . Heart attack Father   . Other Sister     kidney transplant  . Alcoholism Brother   . Goiter Sister   . Diabetes Brother   . Diabetes Sister    Social History  Substance Use Topics  . Smoking status: Current Every Day Smoker -- 0.50 packs/day for 20 years    Types: Cigarettes  . Smokeless tobacco: Never Used  . Alcohol Use: Yes     Comment: 1-2 cans of beer weekly   OB History    Gravida Para Term Preterm AB TAB  SAB Ectopic Multiple Living   1              Review of Systems  Constitutional: Negative for fever and chills.  HENT: Positive for ear pain (FB in ear).   Respiratory: Negative for shortness of breath.   Cardiovascular: Negative for chest pain.   Allergies  Codeine; Flagyl; Imipramine; and Meloxicam  Home Medications   Prior to Admission medications   Medication Sig Start Date End Date Taking? Authorizing Provider  albuterol (PROVENTIL HFA;VENTOLIN HFA) 108 (90 BASE) MCG/ACT inhaler Inhale 2 puffs into the lungs every 6 (six) hours as needed. For wheezing   Yes Historical Provider, MD  aspirin 81 MG EC tablet Take 81 mg by mouth daily. Swallow whole.   Yes Historical Provider, MD  brimonidine-timolol (COMBIGAN) 0.2-0.5 % ophthalmic solution Place 1 drop into both eyes every 12 (twelve) hours.    Yes Historical Provider, MD  clonazePAM (KLONOPIN) 0.5 MG tablet Take 1 mg by mouth 2 (two) times daily as needed for anxiety.   Yes Historical Provider, MD  HYDROcodone-acetaminophen (NORCO/VICODIN) 5-325 MG per tablet Take 1 tablet by mouth daily as needed. pain 02/01/15  Yes Historical  Provider, MD  latanoprost (XALATAN) 0.005 % ophthalmic solution Place 2 drops into both eyes at bedtime.   Yes Historical Provider, MD  lisinopril-hydrochlorothiazide (PRINZIDE,ZESTORETIC) 20-12.5 MG per tablet Take 1 tablet by mouth daily.   Yes Historical Provider, MD  omeprazole (PRILOSEC) 20 MG capsule Take 40 mg by mouth every morning.    Yes Historical Provider, MD  polyethylene glycol (MIRALAX / GLYCOLAX) packet Take 17 g by mouth daily as needed for mild constipation.   Yes Historical Provider, MD  tiotropium (SPIRIVA) 18 MCG inhalation capsule Place 18 mcg into inhaler and inhale daily.     Yes Historical Provider, MD  tiZANidine (ZANAFLEX) 4 MG capsule Take 1 capsule (4 mg total) by mouth 3 (three) times daily. Patient taking differently: Take 4 mg by mouth 3 (three) times daily as needed for muscle  spasms.  02/23/15  Yes Arby Barrette, MD  venlafaxine XR (EFFEXOR-XR) 150 MG 24 hr capsule Take 150 mg by mouth daily with breakfast.   Yes Historical Provider, MD  ziprasidone (GEODON) 20 MG capsule Take 20 mg by mouth at bedtime.   Yes Historical Provider, MD  emtricitabine-tenofovir (TRUVADA) 200-300 MG per tablet Take 1 tablet by mouth daily. Patient not taking: Reported on 09/17/2015 02/23/15   Arby Barrette, MD  escitalopram (LEXAPRO) 20 MG tablet Take 20 mg by mouth at bedtime.     Historical Provider, MD  levofloxacin (LEVAQUIN) 750 MG tablet Take 1 tablet (750 mg total) by mouth once. Patient not taking: Reported on 12/27/2014 06/10/14   Calvert Cantor, MD  predniSONE (DELTASONE) 20 MG tablet Take 2 tablets (40 mg total) by mouth daily with breakfast. Patient not taking: Reported on 12/27/2014 06/10/14   Calvert Cantor, MD  raltegravir (ISENTRESS) 400 MG tablet Take 1 tablet (400 mg total) by mouth 2 (two) times daily. Patient not taking: Reported on 09/17/2015 02/23/15   Arby Barrette, MD  tiZANidine (ZANAFLEX) 4 MG tablet Take 4 mg by mouth 2 (two) times daily.    Historical Provider, MD  zolpidem (AMBIEN) 10 MG tablet Take 1 tablet (10 mg total) by mouth at bedtime as needed for sleep. 06/10/14 07/10/14  Calvert Cantor, MD   BP 154/73 mmHg  Pulse 92  Temp(Src) 98.2 F (36.8 C) (Oral)  Resp 20  Ht  (1.6 m)  Wt 145 lb (65.772 kg)  BMI 25.69 kg/m2  SpO2 99% Physical Exam  Constitutional: She is oriented to person, place, and time. She appears well-developed and well-nourished. No distress.  HENT:  Head: Normocephalic and atraumatic.  Right Ear: Hearing, tympanic membrane and external ear normal.  Left Ear: Hearing, tympanic membrane and external ear normal.  No foreign body present.  Eyes: Conjunctivae are normal.  Cardiovascular: Normal rate.   Pulmonary/Chest: Effort normal.  Abdominal: She exhibits no distension.  Neurological: She is alert and oriented to person, place, and  time.  Skin: Skin is warm and dry.  Psychiatric: She has a normal mood and affect.  Nursing note and vitals reviewed.   ED Course  Procedures   DIAGNOSTIC STUDIES:  Oxygen Saturation is 99% on RA, normal by my interpretation.    COORDINATION OF CARE:  12:52 PM Discussed plan with pt at bedside and pt agreed to plan.    MDM   Final diagnoses:  Ear canal abrasion, left, initial encounter    No foreign body present in ear. Pt afebrile in NAD. VSS. Pt appears safe for discharge.   I personally performed the services described in this  documentation, which was scribed in my presence. The recorded information has been reviewed and is accurate.       Arthor Captain, PA-C 09/17/15 1256  Pricilla Loveless, MD 09/20/15 1524

## 2015-10-17 DIAGNOSIS — I739 Peripheral vascular disease, unspecified: Secondary | ICD-10-CM

## 2015-10-17 HISTORY — DX: Peripheral vascular disease, unspecified: I73.9

## 2016-01-10 ENCOUNTER — Other Ambulatory Visit: Payer: Self-pay | Admitting: Internal Medicine

## 2016-01-10 DIAGNOSIS — Z1231 Encounter for screening mammogram for malignant neoplasm of breast: Secondary | ICD-10-CM

## 2016-01-10 DIAGNOSIS — E2839 Other primary ovarian failure: Secondary | ICD-10-CM

## 2016-04-04 ENCOUNTER — Ambulatory Visit
Admission: RE | Admit: 2016-04-04 | Discharge: 2016-04-04 | Disposition: A | Discharge status: Home / Self Care | Payer: Medicare Other | Source: Ambulatory Visit | Attending: Internal Medicine | Admitting: Internal Medicine

## 2016-04-04 DIAGNOSIS — Z1231 Encounter for screening mammogram for malignant neoplasm of breast: Secondary | ICD-10-CM

## 2016-04-04 DIAGNOSIS — E2839 Other primary ovarian failure: Secondary | ICD-10-CM

## 2016-04-07 ENCOUNTER — Other Ambulatory Visit: Payer: Self-pay | Admitting: Internal Medicine

## 2016-04-07 DIAGNOSIS — R928 Other abnormal and inconclusive findings on diagnostic imaging of breast: Secondary | ICD-10-CM

## 2016-04-14 ENCOUNTER — Ambulatory Visit
Admission: RE | Admit: 2016-04-14 | Discharge: 2016-04-14 | Disposition: A | Discharge status: Home / Self Care | Payer: Medicare Other | Source: Ambulatory Visit | Attending: Internal Medicine | Admitting: Internal Medicine

## 2016-04-14 DIAGNOSIS — R928 Other abnormal and inconclusive findings on diagnostic imaging of breast: Secondary | ICD-10-CM

## 2016-05-28 ENCOUNTER — Emergency Department (HOSPITAL_COMMUNITY): Payer: Medicare Other

## 2016-05-28 ENCOUNTER — Emergency Department (HOSPITAL_COMMUNITY)
Admission: EM | Admit: 2016-05-28 | Discharge: 2016-05-28 | Disposition: A | Discharge status: Home / Self Care | Payer: Medicare Other | Attending: Emergency Medicine | Admitting: Emergency Medicine

## 2016-05-28 ENCOUNTER — Encounter (HOSPITAL_COMMUNITY): Payer: Self-pay | Admitting: Emergency Medicine

## 2016-05-28 DIAGNOSIS — J449 Chronic obstructive pulmonary disease, unspecified: Secondary | ICD-10-CM | POA: Diagnosis not present

## 2016-05-28 DIAGNOSIS — F17210 Nicotine dependence, cigarettes, uncomplicated: Secondary | ICD-10-CM | POA: Insufficient documentation

## 2016-05-28 DIAGNOSIS — R079 Chest pain, unspecified: Secondary | ICD-10-CM | POA: Diagnosis not present

## 2016-05-28 DIAGNOSIS — R600 Localized edema: Secondary | ICD-10-CM | POA: Diagnosis not present

## 2016-05-28 DIAGNOSIS — Z7982 Long term (current) use of aspirin: Secondary | ICD-10-CM | POA: Insufficient documentation

## 2016-05-28 DIAGNOSIS — I10 Essential (primary) hypertension: Secondary | ICD-10-CM | POA: Diagnosis not present

## 2016-05-28 DIAGNOSIS — Z79899 Other long term (current) drug therapy: Secondary | ICD-10-CM | POA: Insufficient documentation

## 2016-05-28 LAB — CBC
HCT: 34.5 % — ABNORMAL LOW (ref 36.0–46.0)
HEMOGLOBIN: 11.3 g/dL — AB (ref 12.0–15.0)
MCH: 31.6 pg (ref 26.0–34.0)
MCHC: 32.8 g/dL (ref 30.0–36.0)
MCV: 96.4 fL (ref 78.0–100.0)
PLATELETS: 302 10*3/uL (ref 150–400)
RBC: 3.58 MIL/uL — ABNORMAL LOW (ref 3.87–5.11)
RDW: 14.4 % (ref 11.5–15.5)
WBC: 7.1 10*3/uL (ref 4.0–10.5)

## 2016-05-28 LAB — BASIC METABOLIC PANEL
Anion gap: 6 (ref 5–15)
BUN: 9 mg/dL (ref 6–20)
CO2: 22 mmol/L (ref 22–32)
Calcium: 8.7 mg/dL — ABNORMAL LOW (ref 8.9–10.3)
Chloride: 107 mmol/L (ref 101–111)
Creatinine, Ser: 0.68 mg/dL (ref 0.44–1.00)
GFR calc Af Amer: >60 mL/min (ref >60)
GFR calc non Af Amer: >60 mL/min (ref >60)
GLUCOSE: 93 mg/dL (ref 65–99)
POTASSIUM: 3.2 mmol/L — AB (ref 3.5–5.1)
Sodium: 135 mmol/L (ref 135–145)

## 2016-05-28 LAB — D-DIMER, QUANTITATIVE: D-Dimer, Quant: 0.53 ug/mL-FEU — ABNORMAL HIGH (ref 0.00–0.50)

## 2016-05-28 LAB — I-STAT TROPONIN, ED: Troponin i, poc: 0.01 ng/mL (ref 0.00–0.08)

## 2016-05-28 MED ORDER — ASPIRIN 81 MG PO CHEW
324.0000 mg | CHEWABLE_TABLET | Freq: Once | ORAL | Status: AC
  Administered 2016-05-28: 324 mg via ORAL
  Filled 2016-05-28: qty 4

## 2016-05-28 MED ORDER — POTASSIUM CHLORIDE 20 MEQ/15ML (10%) PO SOLN
40.0000 meq | Freq: Two times a day (BID) | ORAL | Status: DC
  Administered 2016-05-28: 40 meq via ORAL
  Filled 2016-05-28: qty 30

## 2016-05-28 MED ORDER — OXYCODONE-ACETAMINOPHEN 5-325 MG PO TABS
1.0000 | ORAL_TABLET | Freq: Once | ORAL | Status: AC
  Administered 2016-05-28: 1 via ORAL
  Filled 2016-05-28: qty 1

## 2016-05-28 MED ORDER — LORAZEPAM 1 MG PO TABS
1.0000 mg | ORAL_TABLET | Freq: Once | ORAL | Status: AC
  Administered 2016-05-28: 1 mg via ORAL
  Filled 2016-05-28: qty 1

## 2016-05-28 NOTE — ED Triage Notes (Signed)
Pt sts left sided CP into left arm and pain swelling in left leg x 4 days

## 2016-05-28 NOTE — ED Provider Notes (Signed)
[facility]-EMERGENCY DEPT Provider Note   CSN: [id number] Arrival date & time: 05/28/16  [id number]  First Provider Contact:  None       History   Chief Complaint Chief Complaint  Patient presents with  . Chest Pain  . Leg Pain    HPI Cassie Harrell is a 65 y.o. female.  HPI Patient complains of leg swelling.  It started 4 days ago with the left leg.  She has some calf pain as well.  Today, the right leg became swollen similarly.  It is worse with standing, better after resting overnight.  Today, she developed some central / left sided chest pain with some pain the arm.  It is non-exertional.  No SOB.  She is a tobacco user.  No fevers / coughs at home.  She repeatedly asks for pain medicine during the interview.  Past Medical History:  Diagnosis Date  . Bipolar 1 disorder (HCC)   . Cocaine abuse   . COPD (chronic obstructive pulmonary disease) (HCC)   . Dissociative disorder   . GERD (gastroesophageal reflux disease)   . Glaucoma   . Hypertension   . Neuropathy of foot    bilateral  . Osteoarthritis   . PTSD (post-traumatic stress disorder)   . Sleep apnea     Patient Active Problem List   Diagnosis Date Noted  . Chest pain 06/09/2014  . CAP (community acquired pneumonia) 06/09/2014  . COPD (chronic obstructive pulmonary disease) (HCC)   . Fibromyalgia   . Bipolar 1 disorder (HCC)   . GERD (gastroesophageal reflux disease)     Past Surgical History:  Procedure Laterality Date  . ABDOMINAL HYSTERECTOMY    . APPENDECTOMY    . CESAREAN SECTION    . EYE SURGERY    . FINGER SURGERY    . REVISION OF SCAR TISSUE RECTUS MUSCLE    . UVULOPALATOPHARYNGOPLASTY      OB History    Gravida Para Term Preterm AB Living   1             SAB TAB Ectopic Multiple Live Births                   Home Medications    Prior to Admission medications   Medication Sig Start Date End Date Taking? Authorizing Provider  albuterol (PROVENTIL HFA;VENTOLIN HFA) 108 (90 BASE) MCG/ACT  inhaler Inhale 2 puffs into the lungs every 6 (six) hours as needed. For wheezing    Historical Provider, MD  aspirin 81 MG EC tablet Take 81 mg by mouth daily. Swallow whole.    Historical Provider, MD  brimonidine-timolol (COMBIGAN) 0.2-0.5 % ophthalmic solution Place 1 drop into both eyes every 12 (twelve) hours.     Historical Provider, MD  clonazePAM (KLONOPIN) 0.5 MG tablet Take 1 mg by mouth 2 (two) times daily as needed for anxiety.    Historical Provider, MD  emtricitabine-tenofovir (TRUVADA) 200-300 MG per tablet Take 1 tablet by mouth daily. Patient not taking: Reported on 09/17/2015 02/23/15   Arby BarretteMarcy Pfeiffer, MD  escitalopram (LEXAPRO) 20 MG tablet Take 20 mg by mouth at bedtime.     Historical Provider, MD  HYDROcodone-acetaminophen (NORCO/VICODIN) 5-325 MG per tablet Take 1 tablet by mouth daily as needed. pain 02/01/15   Historical Provider, MD  latanoprost (XALATAN) 0.005 % ophthalmic solution Place 2 drops into both eyes at bedtime.    Historical Provider, MD  levofloxacin (LEVAQUIN) 750 MG tablet Take 1 tablet (750 mg total) by mouth  once. Patient not taking: Reported on 12/27/2014 06/10/14   Calvert Cantor, MD  lisinopril-hydrochlorothiazide (PRINZIDE,ZESTORETIC) 20-12.5 MG per tablet Take 1 tablet by mouth daily.    Historical Provider, MD  omeprazole (PRILOSEC) 20 MG capsule Take 40 mg by mouth every morning.     Historical Provider, MD  polyethylene glycol (MIRALAX / GLYCOLAX) packet Take 17 g by mouth daily as needed for mild constipation.    Historical Provider, MD  predniSONE (DELTASONE) 20 MG tablet Take 2 tablets (40 mg total) by mouth daily with breakfast. Patient not taking: Reported on 12/27/2014 06/10/14   Calvert Cantor, MD  raltegravir (ISENTRESS) 400 MG tablet Take 1 tablet (400 mg total) by mouth 2 (two) times daily. Patient not taking: Reported on 09/17/2015 02/23/15   Arby Barrette, MD  tiotropium (SPIRIVA) 18 MCG inhalation capsule Place 18 mcg into inhaler and inhale  daily.      Historical Provider, MD  tiZANidine (ZANAFLEX) 4 MG capsule Take 1 capsule (4 mg total) by mouth 3 (three) times daily. Patient taking differently: Take 4 mg by mouth 3 (three) times daily as needed for muscle spasms.  02/23/15   Arby Barrette, MD  tiZANidine (ZANAFLEX) 4 MG tablet Take 4 mg by mouth 2 (two) times daily.    Historical Provider, MD  venlafaxine XR (EFFEXOR-XR) 150 MG 24 hr capsule Take 150 mg by mouth daily with breakfast.    Historical Provider, MD  ziprasidone (GEODON) 20 MG capsule Take 20 mg by mouth at bedtime.    Historical Provider, MD  zolpidem (AMBIEN) 10 MG tablet Take 1 tablet (10 mg total) by mouth at bedtime as needed for sleep. 06/10/14 07/10/14  Calvert Cantor, MD    Family History Family History  Problem Relation Age of Onset  . Other Mother     natural  . Heart attack Father   . Other Sister     kidney transplant  . Alcoholism Brother   . Goiter Sister   . Diabetes Brother   . Diabetes Sister     Social History Social History  Substance Use Topics  . Smoking status: Current Every Day Smoker    Packs/day: 0.50    Years: 20.00    Types: Cigarettes  . Smokeless tobacco: Never Used  . Alcohol use Yes     Comment: 1-2 cans of beer weekly     Allergies   Codeine; Flagyl [metronidazole]; Imipramine; and Meloxicam   Review of Systems Review of Systems  Constitutional: Negative for chills and fever.  HENT: Negative for ear pain and sore throat.   Eyes: Negative for pain and visual disturbance.  Respiratory: Negative for cough and shortness of breath.   Cardiovascular: Positive for chest pain and leg swelling. Negative for palpitations.  Gastrointestinal: Negative for abdominal pain and vomiting.  Genitourinary: Negative for dysuria and hematuria.  Musculoskeletal: Negative for arthralgias and back pain.       Leg pain / swelling  Skin: Negative for color change and rash.  Neurological: Negative for seizures and syncope.  All other  systems reviewed and are negative.    Physical Exam Updated Vital Signs BP 144/84 (BP Location: Left Arm)   Pulse 79   Temp 99.3 F (37.4 C) (Oral)   Resp 18   Ht 5' 3.5" (1.613 m)   Wt 69.6 kg   SpO2 100%   BMI 26.74 kg/m   Physical Exam  Constitutional: She appears well-developed and well-nourished. No distress.  HENT:  Head: Normocephalic and atraumatic.  Eyes: Conjunctivae are normal.  Neck: Neck supple.  Cardiovascular: Normal rate and regular rhythm.   No murmur heard. Pulmonary/Chest: Effort normal and breath sounds normal. No respiratory distress. She exhibits tenderness.  Abdominal: Soft. There is no tenderness.  Musculoskeletal: She exhibits no edema.  Non-pitting edema of feet and calves is symmetric   Neurological: She is alert.  Skin: Skin is warm and dry.  Psychiatric: She has a normal mood and affect.  Nursing note and vitals reviewed.    ED Treatments / Results  Labs (all labs ordered are listed, but only abnormal results are displayed) Labs Reviewed  BASIC METABOLIC PANEL - Abnormal; Notable for the following:       Result Value   Potassium 3.2 (*)    Calcium 8.7 (*)    All other components within normal limits  CBC - Abnormal; Notable for the following:    RBC 3.58 (*)    Hemoglobin 11.3 (*)    HCT 34.5 (*)    All other components within normal limits  I-STAT TROPOININ, ED    EKG  EKG Interpretation None       Radiology Dg Chest 2 View  Result Date: 05/28/2016 CLINICAL DATA:  Left-sided chest pain radiating into the arm for 4 days EXAM: CHEST  2 VIEW COMPARISON:  12/27/2014 FINDINGS: Cardiac shadow is within normal limits. Some minimal scarring is noted in the bases bilaterally. No focal infiltrate or sizable effusion is seen. No acute bony abnormality noted. IMPRESSION: No active cardiopulmonary disease. Electronically Signed   By: Alcide Clever M.D.   On: 05/28/2016 19:58    Procedures Procedures (including critical care  time)  Medications Ordered in ED Medications - No data to display   Initial Impression / Assessment and Plan / ED Course  I have reviewed the triage vital signs and the nursing notes.  Pertinent labs & imaging results that were available during my care of the patient were reviewed by me and considered in my medical decision making (see chart for details).  Clinical Course     Patient presents for evaluation of chest pain.  HEART Pathway evaluation is low risk score of 3.  Troponin negative. Normal CT angio a year ago.  EKG is NSR without ischemic changes.  Feel like PNA unlikely given CXR without infiltrates or effusion, no leukocytosis, no fever.  No mediastinal widening on CXR.  Doubt emergent intraabdominal pathology given benign abdominal exam and no abdominal symptoms.  Low risk Wells and d-dimer was age adjusted negative.   For leg swelling, it is symmetric, non-pitting.  D-dimer age adjusted negative, suspect dependent edema.  Will recommend PCP f/u.  Labs showed modest hypoK without EKG changes, given supplementation here and asked to f/u with PCP.  Impression is mmsk chest pain, low K.  We have discussed the discharge plan, including the plan for outpatient followup, and strict return precautions, including those that would require calling 911.      Final Clinical Impressions(s) / ED Diagnoses   Final diagnoses:  None    New Prescriptions New Prescriptions   No medications on file     Marcelina Morel, MD 05/28/16 2232    Cathren Laine, MD 05/29/16 0002

## 2016-06-27 ENCOUNTER — Emergency Department (HOSPITAL_COMMUNITY)
Admission: EM | Admit: 2016-06-27 | Discharge: 2016-06-28 | Disposition: A | Discharge status: Home / Self Care | Payer: Medicare Other | Attending: Emergency Medicine | Admitting: Emergency Medicine

## 2016-06-27 ENCOUNTER — Emergency Department (HOSPITAL_COMMUNITY): Payer: Medicare Other

## 2016-06-27 ENCOUNTER — Encounter (HOSPITAL_COMMUNITY): Payer: Self-pay | Admitting: Emergency Medicine

## 2016-06-27 DIAGNOSIS — G4700 Insomnia, unspecified: Secondary | ICD-10-CM | POA: Insufficient documentation

## 2016-06-27 DIAGNOSIS — F17210 Nicotine dependence, cigarettes, uncomplicated: Secondary | ICD-10-CM | POA: Diagnosis not present

## 2016-06-27 DIAGNOSIS — Z7982 Long term (current) use of aspirin: Secondary | ICD-10-CM | POA: Insufficient documentation

## 2016-06-27 DIAGNOSIS — I10 Essential (primary) hypertension: Secondary | ICD-10-CM | POA: Diagnosis not present

## 2016-06-27 DIAGNOSIS — J449 Chronic obstructive pulmonary disease, unspecified: Secondary | ICD-10-CM | POA: Diagnosis not present

## 2016-06-27 DIAGNOSIS — R45851 Suicidal ideations: Secondary | ICD-10-CM | POA: Diagnosis not present

## 2016-06-27 DIAGNOSIS — R079 Chest pain, unspecified: Secondary | ICD-10-CM | POA: Diagnosis not present

## 2016-06-27 DIAGNOSIS — R51 Headache: Secondary | ICD-10-CM | POA: Diagnosis not present

## 2016-06-27 DIAGNOSIS — F319 Bipolar disorder, unspecified: Secondary | ICD-10-CM | POA: Diagnosis present

## 2016-06-27 DIAGNOSIS — F3130 Bipolar disorder, current episode depressed, mild or moderate severity, unspecified: Secondary | ICD-10-CM | POA: Diagnosis not present

## 2016-06-27 DIAGNOSIS — Z79899 Other long term (current) drug therapy: Secondary | ICD-10-CM | POA: Diagnosis not present

## 2016-06-27 DIAGNOSIS — R531 Weakness: Secondary | ICD-10-CM | POA: Diagnosis present

## 2016-06-27 LAB — COMPREHENSIVE METABOLIC PANEL
ALK PHOS: 59 U/L (ref 38–126)
ALT: 14 U/L (ref 14–54)
ANION GAP: 9 (ref 5–15)
AST: 27 U/L (ref 15–41)
Albumin: 4.2 g/dL (ref 3.5–5.0)
BILIRUBIN TOTAL: 1.1 mg/dL (ref 0.3–1.2)
BUN: 11 mg/dL (ref 6–20)
CALCIUM: 9 mg/dL (ref 8.9–10.3)
CO2: 24 mmol/L (ref 22–32)
CREATININE: 0.62 mg/dL (ref 0.44–1.00)
Chloride: 104 mmol/L (ref 101–111)
GFR calc non Af Amer: >60 mL/min (ref >60)
GLUCOSE: 103 mg/dL — AB (ref 65–99)
Potassium: 3.4 mmol/L — ABNORMAL LOW (ref 3.5–5.1)
Sodium: 137 mmol/L (ref 135–145)
TOTAL PROTEIN: 7.3 g/dL (ref 6.5–8.1)

## 2016-06-27 LAB — ACETAMINOPHEN LEVEL

## 2016-06-27 LAB — URINALYSIS, ROUTINE W REFLEX MICROSCOPIC
Bilirubin Urine: NEGATIVE
GLUCOSE, UA: NEGATIVE mg/dL
Hgb urine dipstick: NEGATIVE
KETONES UR: NEGATIVE mg/dL
LEUKOCYTES UA: NEGATIVE
Nitrite: NEGATIVE
PROTEIN: NEGATIVE mg/dL
Specific Gravity, Urine: 1.009 (ref 1.005–1.030)
pH: 5.5 (ref 5.0–8.0)

## 2016-06-27 LAB — CBC
HEMATOCRIT: 39.9 % (ref 36.0–46.0)
HEMOGLOBIN: 13.5 g/dL (ref 12.0–15.0)
MCH: 31.8 pg (ref 26.0–34.0)
MCHC: 33.8 g/dL (ref 30.0–36.0)
MCV: 94.1 fL (ref 78.0–100.0)
Platelets: 313 10*3/uL (ref 150–400)
RBC: 4.24 MIL/uL (ref 3.87–5.11)
RDW: 14.3 % (ref 11.5–15.5)
WBC: 4.4 10*3/uL (ref 4.0–10.5)

## 2016-06-27 LAB — DIFFERENTIAL
BASOS ABS: 0.1 10*3/uL (ref 0.0–0.1)
BASOS PCT: 3 %
EOS PCT: 4 %
Eosinophils Absolute: 0.2 10*3/uL (ref 0.0–0.7)
LYMPHS ABS: 1.8 10*3/uL (ref 0.7–4.0)
Lymphocytes Relative: 40 %
MONO ABS: 0.4 10*3/uL (ref 0.1–1.0)
Monocytes Relative: 8 %
Neutro Abs: 1.9 10*3/uL (ref 1.7–7.7)
Neutrophils Relative %: 45 %

## 2016-06-27 LAB — RAPID URINE DRUG SCREEN, HOSP PERFORMED
Amphetamines: NOT DETECTED
BARBITURATES: NOT DETECTED
Benzodiazepines: NOT DETECTED
COCAINE: NOT DETECTED
Opiates: NOT DETECTED
TETRAHYDROCANNABINOL: NOT DETECTED

## 2016-06-27 LAB — I-STAT CHEM 8, ED
BUN: 9 mg/dL (ref 6–20)
CHLORIDE: 103 mmol/L (ref 101–111)
Calcium, Ion: 1.14 mmol/L — ABNORMAL LOW (ref 1.15–1.40)
Creatinine, Ser: 0.6 mg/dL (ref 0.44–1.00)
Glucose, Bld: 102 mg/dL — ABNORMAL HIGH (ref 65–99)
HEMATOCRIT: 45 % (ref 36.0–46.0)
Hemoglobin: 15.3 g/dL — ABNORMAL HIGH (ref 12.0–15.0)
POTASSIUM: 3.5 mmol/L (ref 3.5–5.1)
SODIUM: 140 mmol/L (ref 135–145)
TCO2: 25 mmol/L (ref 0–100)

## 2016-06-27 LAB — APTT: APTT: 27 s (ref 24–36)

## 2016-06-27 LAB — I-STAT TROPONIN, ED: Troponin i, poc: 0.01 ng/mL (ref 0.00–0.08)

## 2016-06-27 LAB — PROTIME-INR
INR: 1.01
Prothrombin Time: 13.3 seconds (ref 11.4–15.2)

## 2016-06-27 LAB — ETHANOL: Alcohol, Ethyl (B): <5 mg/dL (ref <5)

## 2016-06-27 LAB — SALICYLATE LEVEL

## 2016-06-27 LAB — CK: Total CK: 44 U/L (ref 38–234)

## 2016-06-27 MED ORDER — LATANOPROST 0.005 % OP SOLN
2.0000 [drp] | Freq: Every day | OPHTHALMIC | Status: DC
  Administered 2016-06-27: 2 [drp] via OPHTHALMIC
  Filled 2016-06-27: qty 2.5

## 2016-06-27 MED ORDER — OMEGA-3-ACID ETHYL ESTERS 1 G PO CAPS
1.0000 g | ORAL_CAPSULE | Freq: Every day | ORAL | Status: DC
  Administered 2016-06-27 – 2016-06-28 (×2): 1 g via ORAL
  Filled 2016-06-27 (×2): qty 1

## 2016-06-27 MED ORDER — HYDROCHLOROTHIAZIDE 12.5 MG PO CAPS
12.5000 mg | ORAL_CAPSULE | Freq: Every day | ORAL | Status: DC
  Administered 2016-06-28: 12.5 mg via ORAL
  Filled 2016-06-27 (×3): qty 1

## 2016-06-27 MED ORDER — VENLAFAXINE HCL ER 75 MG PO CP24
75.0000 mg | ORAL_CAPSULE | Freq: Every day | ORAL | Status: DC
  Administered 2016-06-27 – 2016-06-28 (×2): 75 mg via ORAL
  Filled 2016-06-27 (×2): qty 1

## 2016-06-27 MED ORDER — ACETAMINOPHEN 325 MG PO TABS
650.0000 mg | ORAL_TABLET | ORAL | Status: DC | PRN
  Administered 2016-06-28: 650 mg via ORAL
  Filled 2016-06-27: qty 2

## 2016-06-27 MED ORDER — METOPROLOL SUCCINATE ER 25 MG PO TB24
25.0000 mg | ORAL_TABLET | Freq: Every day | ORAL | Status: DC
  Administered 2016-06-28: 25 mg via ORAL
  Filled 2016-06-27 (×2): qty 1

## 2016-06-27 MED ORDER — ZOLPIDEM TARTRATE 10 MG PO TABS
10.0000 mg | ORAL_TABLET | Freq: Every evening | ORAL | Status: DC | PRN
Start: 2016-06-27 — End: 2016-06-28
  Administered 2016-06-27: 10 mg via ORAL
  Filled 2016-06-27: qty 1

## 2016-06-27 MED ORDER — ASPIRIN EC 81 MG PO TBEC
81.0000 mg | DELAYED_RELEASE_TABLET | Freq: Every day | ORAL | Status: DC
  Administered 2016-06-27 – 2016-06-28 (×2): 81 mg via ORAL
  Filled 2016-06-27 (×2): qty 1

## 2016-06-27 MED ORDER — TIMOLOL MALEATE 0.5 % OP SOLN
1.0000 [drp] | Freq: Two times a day (BID) | OPHTHALMIC | Status: DC
  Administered 2016-06-28: 1 [drp] via OPHTHALMIC
  Filled 2016-06-27: qty 5

## 2016-06-27 MED ORDER — LINACLOTIDE 145 MCG PO CAPS
145.0000 ug | ORAL_CAPSULE | Freq: Every day | ORAL | Status: DC
  Administered 2016-06-28: 145 ug via ORAL
  Filled 2016-06-27: qty 1

## 2016-06-27 MED ORDER — LISINOPRIL-HYDROCHLOROTHIAZIDE 20-12.5 MG PO TABS: 1.0000 | ORAL_TABLET | Freq: Every day | ORAL | Status: DC

## 2016-06-27 MED ORDER — UMECLIDINIUM BROMIDE 62.5 MCG/INH IN AEPB
1.0000 | INHALATION_SPRAY | Freq: Every day | RESPIRATORY_TRACT | Status: DC
  Administered 2016-06-28: 1 via RESPIRATORY_TRACT
  Filled 2016-06-27: qty 7

## 2016-06-27 MED ORDER — ONDANSETRON HCL 4 MG PO TABS: 4.0000 mg | ORAL_TABLET | Freq: Three times a day (TID) | ORAL | Status: DC | PRN

## 2016-06-27 MED ORDER — BRIMONIDINE TARTRATE 0.2 % OP SOLN
1.0000 [drp] | Freq: Two times a day (BID) | OPHTHALMIC | Status: DC
  Administered 2016-06-28: 1 [drp] via OPHTHALMIC
  Filled 2016-06-27: qty 5

## 2016-06-27 MED ORDER — PANTOPRAZOLE SODIUM 40 MG PO TBEC
80.0000 mg | DELAYED_RELEASE_TABLET | Freq: Every day | ORAL | Status: DC
  Administered 2016-06-27 – 2016-06-28 (×2): 80 mg via ORAL
  Filled 2016-06-27 (×2): qty 2

## 2016-06-27 MED ORDER — VENLAFAXINE HCL 75 MG PO TABS: 75.0000 mg | ORAL_TABLET | Freq: Every day | ORAL | Status: DC

## 2016-06-27 MED ORDER — ACETAMINOPHEN 325 MG PO TABS
650.0000 mg | ORAL_TABLET | Freq: Once | ORAL | Status: AC
  Administered 2016-06-27: 650 mg via ORAL
  Filled 2016-06-27: qty 2

## 2016-06-27 MED ORDER — LISINOPRIL 20 MG PO TABS
20.0000 mg | ORAL_TABLET | Freq: Every day | ORAL | Status: DC
  Administered 2016-06-28: 20 mg via ORAL
  Filled 2016-06-27 (×2): qty 1

## 2016-06-27 MED ORDER — BRIMONIDINE TARTRATE-TIMOLOL 0.2-0.5 % OP SOLN: 1.0000 [drp] | Freq: Two times a day (BID) | OPHTHALMIC | Status: DC

## 2016-06-27 MED ORDER — ALBUTEROL SULFATE HFA 108 (90 BASE) MCG/ACT IN AERS
2.0000 | INHALATION_SPRAY | Freq: Four times a day (QID) | RESPIRATORY_TRACT | Status: DC | PRN
Start: 2016-06-27 — End: 2016-06-28

## 2016-06-27 MED ORDER — TIZANIDINE HCL 4 MG PO TABS
4.0000 mg | ORAL_TABLET | Freq: Three times a day (TID) | ORAL | Status: DC | PRN
  Administered 2016-06-27 – 2016-06-28 (×2): 4 mg via ORAL
  Filled 2016-06-27 (×2): qty 1

## 2016-06-27 MED ORDER — LORAZEPAM 1 MG PO TABS
2.0000 mg | ORAL_TABLET | Freq: Once | ORAL | Status: AC
  Administered 2016-06-27: 2 mg via ORAL
  Filled 2016-06-27: qty 2

## 2016-06-27 MED ORDER — LORAZEPAM 1 MG PO TABS
1.0000 mg | ORAL_TABLET | Freq: Three times a day (TID) | ORAL | Status: DC | PRN
  Administered 2016-06-27 – 2016-06-28 (×2): 1 mg via ORAL
  Filled 2016-06-27 (×2): qty 1

## 2016-06-27 MED ORDER — VITAMIN D (ERGOCALCIFEROL) 1.25 MG (50000 UNIT) PO CAPS: 50000.0000 [IU] | ORAL_CAPSULE | ORAL | Status: DC

## 2016-06-27 MED ORDER — NICOTINE 21 MG/24HR TD PT24
21.0000 mg | MEDICATED_PATCH | Freq: Every day | TRANSDERMAL | Status: DC
  Administered 2016-06-27 – 2016-06-28 (×2): 21 mg via TRANSDERMAL
  Filled 2016-06-27 (×2): qty 1

## 2016-06-27 NOTE — ED Notes (Signed)
Bed: WA01 Expected date:  Expected time:  Means of arrival:  Comments: 22F/insomnia/anxiety

## 2016-06-27 NOTE — ED Notes (Signed)
Patient given sprite to drink. 

## 2016-06-27 NOTE — ED Notes (Signed)
Patient's belongings placed in locker #28.  Meal tray ordered for patient.

## 2016-06-27 NOTE — ED Triage Notes (Addendum)
Per EMS, patient reports anxiety/trouble sleeping/shaking. Patient was prescribed seroquel by PCP; patient reports continued trouble sleeping. Patient states she is now hearing voices. Also states, "I can't live like this" and "I don't feel safe." Patient reports SI. Patient reports left arm and chest pain Patient is from home.

## 2016-06-27 NOTE — ED Notes (Signed)
Patient reports she was recently started on Seroquel which she believes caused a worsening of her symptoms.  She reports in the past Trazodone was helpful for sleep.  Patient was able to call her god daughter to come and take care of her cat at home.  God daughter at bedside visiting with patient.

## 2016-06-27 NOTE — ED Provider Notes (Signed)
[facility]-EMERGENCY DEPT Provider Note   CSN: [id number] Arrival date & time: 06/27/16  1012     History   Chief Complaint Chief Complaint  Patient presents with  . Suicidal  . Hallucinations  . Insomnia  . Chest Pain    HPI Cassie Harrell is a 65 y.o. female presents with depression, suicidal thoughts, and arm pain. She states that she has not slept for 3 straight days. Had her medicines changed a [facility] yesterday. Took Seroquel for the first time last night and shortly thereafter started feeling "psychotic". She states that ever since then she has been feeling suicidal and having left leg weakness. Her left arm hurts as well. No swelling. Had transient chest pain last night that is gone. Patient is having tremors and anxiety and asking for something for her anxiety. There have been no headaches or neck pain. She has never had Seroquel before.  HPI  Past Medical History:  Diagnosis Date  . Bipolar 1 disorder (HCC)   . Cocaine abuse   . COPD (chronic obstructive pulmonary disease) (HCC)   . Dissociative disorder   . GERD (gastroesophageal reflux disease)   . Glaucoma   . Hypertension   . Neuropathy of foot    bilateral  . Osteoarthritis   . PTSD (post-traumatic stress disorder)   . Sleep apnea     Patient Active Problem List   Diagnosis Date Noted  . Chest pain 06/09/2014  . CAP (community acquired pneumonia) 06/09/2014  . COPD (chronic obstructive pulmonary disease) (HCC)   . Fibromyalgia   . Bipolar 1 disorder (HCC)   . GERD (gastroesophageal reflux disease)     Past Surgical History:  Procedure Laterality Date  . ABDOMINAL HYSTERECTOMY    . APPENDECTOMY    . CESAREAN SECTION    . EYE SURGERY    . FINGER SURGERY    . REVISION OF SCAR TISSUE RECTUS MUSCLE    . UVULOPALATOPHARYNGOPLASTY      OB History    Gravida Para Term Preterm AB Living   1             SAB TAB Ectopic Multiple Live Births                   Home Medications    Prior to  Admission medications   Medication Sig Start Date End Date Taking? Authorizing Provider  albuterol (PROVENTIL HFA;VENTOLIN HFA) 108 (90 BASE) MCG/ACT inhaler Inhale 2 puffs into the lungs every 6 (six) hours as needed. For wheezing   Yes Historical Provider, MD  aspirin 81 MG EC tablet Take 81 mg by mouth daily. Swallow whole.   Yes Historical Provider, MD  brimonidine-timolol (COMBIGAN) 0.2-0.5 % ophthalmic solution Place 1 drop into both eyes every 12 (twelve) hours.    Yes Historical Provider, MD  HYDROcodone-acetaminophen (NORCO/VICODIN) 5-325 MG per tablet Take 1 tablet by mouth daily as needed. pain 02/01/15  Yes Historical Provider, MD  ibuprofen (ADVIL,MOTRIN) 800 MG tablet Take 800 mg by mouth 2 (two) times daily as needed for moderate pain.  06/22/16  Yes Historical Provider, MD  latanoprost (XALATAN) 0.005 % ophthalmic solution Place 2 drops into both eyes at bedtime.   Yes Historical Provider, MD  linaclotide (LINZESS) 145 MCG CAPS capsule Take 145 mcg by mouth daily before breakfast.   Yes Historical Provider, MD  lisinopril-hydrochlorothiazide (PRINZIDE,ZESTORETIC) 20-12.5 MG per tablet Take 1 tablet by mouth daily.   Yes Historical Provider, MD  metoprolol succinate (TOPROL-XL) 50 MG 24  hr tablet Take 25 mg by mouth daily. Take with or immediately following a meal.   Yes Historical Provider, MD  Omega-3 Fatty Acids (FISH OIL PO) Take 1 capsule by mouth daily.   Yes Historical Provider, MD  omeprazole (PRILOSEC) 20 MG capsule Take 40 mg by mouth every morning.    Yes Historical Provider, MD  tiZANidine (ZANAFLEX) 4 MG capsule Take 1 capsule (4 mg total) by mouth 3 (three) times daily. Patient taking differently: Take 4 mg by mouth 3 (three) times daily as needed for muscle spasms.  02/23/15  Yes Arby Barrette, MD  tiZANidine (ZANAFLEX) 4 MG tablet Take 4 mg by mouth 2 (two) times daily as needed for muscle spasms.    Yes Historical Provider, MD  umeclidinium bromide (INCRUSE ELLIPTA) 62.5  MCG/INH AEPB Inhale 1 puff into the lungs daily.   Yes Historical Provider, MD  venlafaxine XR (EFFEXOR-XR) 75 MG 24 hr capsule Take 75 mg by mouth daily with breakfast.   Yes Historical Provider, MD  Vitamin D, Ergocalciferol, (DRISDOL) 50000 units CAPS capsule Take 50,000 Units by mouth once a week. 06/16/16  Yes Historical Provider, MD  zolpidem (AMBIEN) 10 MG tablet Take 1 tablet (10 mg total) by mouth at bedtime as needed for sleep. 06/10/14 07/10/14  Calvert Cantor, MD    Family History Family History  Problem Relation Age of Onset  . Other Mother     natural  . Heart attack Father   . Other Sister     kidney transplant  . Alcoholism Brother   . Goiter Sister   . Diabetes Brother   . Diabetes Sister     Social History Social History  Substance Use Topics  . Smoking status: Current Every Day Smoker    Packs/day: 0.50    Years: 20.00    Types: Cigarettes  . Smokeless tobacco: Never Used  . Alcohol use Yes     Comment: 1-2 cans of beer weekly     Allergies   Codeine; Flagyl [metronidazole]; Imipramine; Meloxicam; Metoprolol tartrate; and Vistaril [hydroxyzine hcl]   Review of Systems Review of Systems  Respiratory: Negative for shortness of breath.   Cardiovascular: Positive for chest pain.  Gastrointestinal: Negative for abdominal pain and vomiting.  Musculoskeletal:       Arm pain  Neurological: Positive for tremors and weakness. Negative for headaches.  Psychiatric/Behavioral: Positive for dysphoric mood, sleep disturbance and suicidal ideas. The patient is nervous/anxious.   All other systems reviewed and are negative.    Physical Exam Updated Vital Signs BP 125/66 (BP Location: Right Arm)   Pulse 90   Temp 98.2 F (36.8 C)   Resp 20   Ht 5\' 3"  (1.6 m)   Wt 142 lb (64.4 kg)   SpO2 100%   BMI 25.15 kg/m   Physical Exam  Constitutional: She is oriented to person, place, and time. She appears well-developed and well-nourished.  HENT:  Head:  Normocephalic and atraumatic.  Right Ear: External ear normal.  Left Ear: External ear normal.  Nose: Nose normal.  Eyes: Right eye exhibits no discharge. Left eye exhibits no discharge.  Cardiovascular: Normal rate, regular rhythm and normal heart sounds.   Pulses:      Radial pulses are 2+ on the right side, and 2+ on the left side.       Dorsalis pedis pulses are 2+ on the right side, and 2+ on the left side.  Pulmonary/Chest: Effort normal and breath sounds normal.  Abdominal: Soft. There  is no tenderness.  Musculoskeletal:       Left upper arm: She exhibits tenderness (no swelling, mild tenderness anteriorly).  Neurological: She is alert and oriented to person, place, and time. She displays tremor.  CN 3-12 grossly intact. 5/5 strength in BUE. RLE 5/5. LLE slightly weaker than right but technically 5/5. Inconsistent exam.  Skin: Skin is warm and dry.  Nursing note and vitals reviewed.    ED Treatments / Results  Labs (all labs ordered are listed, but only abnormal results are displayed) Labs Reviewed  COMPREHENSIVE METABOLIC PANEL - Abnormal; Notable for the following:       Result Value   Potassium 3.4 (*)    Glucose, Bld 103 (*)    All other components within normal limits  ACETAMINOPHEN LEVEL - Abnormal; Notable for the following:    Acetaminophen (Tylenol), Serum <10 (*)    All other components within normal limits  I-STAT CHEM 8, ED - Abnormal; Notable for the following:    Glucose, Bld 102 (*)    Calcium, Ion 1.14 (*)    Hemoglobin 15.3 (*)    All other components within normal limits  ETHANOL  SALICYLATE LEVEL  CBC  URINE RAPID DRUG SCREEN, HOSP PERFORMED  PROTIME-INR  APTT  URINALYSIS, ROUTINE W REFLEX MICROSCOPIC (NOT AT [facility])  CK  DIFFERENTIAL  I-STAT TROPOININ, ED    EKG  EKG Interpretation  Date/Time:  Tuesday June 27 2016 10:28:49 EDT Ventricular Rate:  69 PR Interval:    QRS Duration: 115 QT Interval:  432 QTC Calculation: 463 R  Axis:   -24 Text Interpretation:  Sinus rhythm Probable left ventricular hypertrophy No significant change since 05/28/16 Confirmed by Criss Alvine MD, Jyrah Blye [phone]) on 06/27/2016 10:43:35 AM Also confirmed by Criss Alvine MD, Aaric Dolph [phone]), editor [place], Cala Bradford [phone])  on 06/27/2016 11:27:25 AM       Radiology Dg Chest 2 View  Result Date: 06/27/2016 CLINICAL DATA:  Anxiety attack this morning with onset of chest pain. EXAM: CHEST  2 VIEW COMPARISON:  PA and lateral chest 12/27/2014 and 05/28/2016. CT chest 12/28/2014. FINDINGS: The lungs are clear. Heart size is normal. There is no pneumothorax or pleural effusion. No focal bony abnormality. IMPRESSION: No acute disease. Electronically Signed   By: Drusilla Kanner M.D.   On: 06/27/2016 10:45   Ct Head Wo Contrast  Result Date: 06/27/2016 CLINICAL DATA:  Hearing voices. Difficulty sleeping and anxiety. Headache today. EXAM: CT HEAD WITHOUT CONTRAST TECHNIQUE: Contiguous axial images were obtained from the base of the skull through the vertex without intravenous contrast. COMPARISON:  Head CT scan 08/08/2012. FINDINGS: Brain: Appears normal without hemorrhage, infarct, mass lesion, mass effect, midline shift or abnormal extra-axial fluid collection. No hydrocephalus or pneumocephalus. Vascular: Unremarkable. Skull: Unremarkable. Sinuses/Orbits: Unremarkable. Other: None. IMPRESSION: Negative head CT.  No acute abnormality. Electronically Signed   By: Drusilla Kanner M.D.   On: 06/27/2016 11:31    Procedures Procedures (including critical care time)  Medications Ordered in ED Medications  ondansetron (ZOFRAN) tablet 4 mg (not administered)  nicotine (NICODERM CQ - dosed in mg/24 hours) patch 21 mg (21 mg Transdermal Patch Applied 06/27/16 1414)  acetaminophen (TYLENOL) tablet 650 mg (not administered)  LORazepam (ATIVAN) tablet 1 mg (not administered)  albuterol (PROVENTIL HFA;VENTOLIN HFA) 108 (90 Base) MCG/ACT inhaler 2 puff (not administered)   aspirin EC tablet 81 mg (not administered)  latanoprost (XALATAN) 0.005 % ophthalmic solution 2 drop (not administered)  linaclotide (LINZESS) capsule 145 mcg (not administered)  metoprolol  succinate (TOPROL-XL) 24 hr tablet 25 mg (not administered)  omega-3 acid ethyl esters (LOVAZA) capsule 1 g (not administered)  pantoprazole (PROTONIX) EC tablet 80 mg (80 mg Oral Given 06/27/16 1413)  tiZANidine (ZANAFLEX) tablet 4 mg (not administered)  umeclidinium bromide (INCRUSE ELLIPTA) 62.5 MCG/INH 1 puff (1 puff Inhalation Not Given 06/27/16 1400)  Vitamin D (Ergocalciferol) (DRISDOL) capsule 50,000 Units (not administered)  zolpidem (AMBIEN) tablet 10 mg (not administered)  lisinopril (PRINIVIL,ZESTRIL) tablet 20 mg (not administered)    And  hydrochlorothiazide (MICROZIDE) capsule 12.5 mg (not administered)  brimonidine (ALPHAGAN) 0.2 % ophthalmic solution 1 drop (not administered)    And  timolol (TIMOPTIC) 0.5 % ophthalmic solution 1 drop (not administered)  venlafaxine XR (EFFEXOR-XR) 24 hr capsule 75 mg (not administered)  LORazepam (ATIVAN) tablet 2 mg (2 mg Oral Given 06/27/16 1129)  acetaminophen (TYLENOL) tablet 650 mg (650 mg Oral Given 06/27/16 1229)     Initial Impression / Assessment and Plan / ED Course  I have reviewed the triage vital signs and the nursing notes.  Pertinent labs & imaging results that were available during my care of the patient were reviewed by me and considered in my medical decision making (see chart for details).  Clinical Course  Comment By Time  Will check CT head, labs, give PO ativan. Last normal before leg weakness was last night, no indication for code CVA. Pricilla LovelessScott Aldonia Keeven, MD 09/12 1059  Patient states her left leg now moves normally and has no weakness after the Ativan. I have quite low suspicion of CVA or acute spinal emergency. [facility] as patient still feels some vague suicidal thoughts. Pricilla LovelessScott Seraphine Gudiel, MD 09/12 1218    I  believe patient's primary symptoms are psychiatric. She appears medically stable for further psychiatric consultation and workup. Her neuro exam was a little abnormal but given that it resolved with Ativan I think it was not CNS related. Do not feel MRI is needed. Pending psychiatric disposition.  Final Clinical Impressions(s) / ED Diagnoses   Final diagnoses:  Insomnia    New Prescriptions New Prescriptions   No medications on file     Pricilla LovelessScott Sayre Witherington, MD 06/27/16 1502

## 2016-06-27 NOTE — ED Notes (Signed)
Bed: [id number] Expected date:  Expected time:  Means of arrival:  Comments: Room 1

## 2016-06-27 NOTE — ED Notes (Signed)
TTS at bedside. Unable to administer medications at this time.
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
## 2016-06-27 NOTE — ED Notes (Signed)
Pt sleeping in bed with eyes closed. Respirations are even and unlabored. Will continue to monitor

## 2016-06-28 DIAGNOSIS — F319 Bipolar disorder, unspecified: Secondary | ICD-10-CM | POA: Diagnosis present

## 2016-06-28 DIAGNOSIS — F3130 Bipolar disorder, current episode depressed, mild or moderate severity, unspecified: Secondary | ICD-10-CM | POA: Diagnosis not present

## 2016-06-28 DIAGNOSIS — G4700 Insomnia, unspecified: Secondary | ICD-10-CM | POA: Diagnosis not present

## 2016-06-28 MED ORDER — TRAZODONE HCL 100 MG PO TABS: 100.0000 mg | ORAL_TABLET | Freq: Every day | ORAL | Status: DC

## 2016-06-28 MED ORDER — TRAZODONE HCL 100 MG PO TABS: 100.0000 mg | ORAL_TABLET | Freq: Every day | ORAL | 0 refills | Status: DC

## 2016-06-28 NOTE — Consult Note (Signed)
[place] [facility] Consult   Reason for Consult:  Suicidal ideations Referring Physician:  EDP Patient Identification: Cassie Harrell MRN:  [id number] Principal Diagnosis: Bipolar disorder with depression [facility]) Diagnosis:   Patient Active Problem List   Diagnosis Date Noted  . Bipolar disorder with depression ([place]) [F31.30] 06/28/2016    Priority: High  . Chest pain [R07.9] 06/09/2014  . CAP (community acquired pneumonia) [J18.9] 06/09/2014  . COPD (chronic obstructive pulmonary disease) ([place]) [J44.9]   . Fibromyalgia [M79.7]   . Bipolar 1 disorder ([place]) [F31.9]   . GERD (gastroesophageal reflux disease) [K21.9]     Total Time spent with patient: 45 minutes  Subjective:   Cassie Harrell is a 65 y.o. female patient does not warrant admission.  HPI:  65 yo female who presented to the ED with suicidal ideations and plan to overdose.  She reports her medications were changed at [facility] and she has not been able to sleep until last night.  Now, she feels better and denies suicidal/homicidal ideations, hallucinations, and alcohol/drug abuse.  She misses her cat and wants to go home and be with her.  Trazodone Rx provided for dishcarge.   Past Psychiatric History: bipolar disorder  Risk to Self: Suicidal Ideation: Yes-Currently Present Suicidal Intent: Yes-Currently Present Is patient at risk for suicide?: Yes Suicidal Plan?: Yes-Currently Present Specify Current Suicidal Plan: Overdose Access to Means: Yes Specify Access to Suicidal Means: pt has medications What has been your use of drugs/alcohol within the last 12 months?: currently denies How many times?: 1 Other Self Harm Risks: none Triggers for Past Attempts: Unpredictable Intentional Self Injurious Behavior: None Risk to Others: Homicidal Ideation: No Thoughts of Harm to Others: No Current Homicidal Intent: No Current Homicidal Plan: No Access to Homicidal Means: No Identified Victim: na History of  harm to others?: No Assessment of Violence: None Noted Violent Behavior Description: na Does patient have access to weapons?: No Criminal Charges Pending?: No Does patient have a court date: No Prior Inpatient Therapy: Prior Inpatient Therapy: Yes Prior Therapy Dates: 2013 Prior Therapy Facilty/Provider(s): [facility] Reason for Treatment: MH issues Prior Outpatient Therapy: Prior Outpatient Therapy: Yes Prior Therapy Dates: 2017 Prior Therapy Facilty/Provider(s): [facility] Reason for Treatment: MH issues Does patient have an ACCT team?: No Does patient have Intensive In-House Services?  : No Does patient have [facility] services? : Yes Does patient have P4[facility] services?: No  Past Medical History:  Past Medical History:  Diagnosis Date  . Bipolar 1 disorder ([place])   . Cocaine abuse   . COPD (chronic obstructive pulmonary disease) ([place])   . Dissociative disorder   . GERD (gastroesophageal reflux disease)   . Glaucoma   . Hypertension   . Neuropathy of foot    bilateral  . Osteoarthritis   . PTSD (post-traumatic stress disorder)   . Sleep apnea     Past Surgical History:  Procedure Laterality Date  . ABDOMINAL HYSTERECTOMY    . APPENDECTOMY    . CESAREAN SECTION    . EYE SURGERY    . FINGER SURGERY    . REVISION OF SCAR TISSUE RECTUS MUSCLE    . UVULOPALATOPHARYNGOPLASTY     Family History:  Family History  Problem Relation Age of Onset  . Other Mother     natural  . Heart attack Father   . Other Sister     kidney transplant  . Alcoholism Brother   . Goiter Sister   . Diabetes Brother   . Diabetes Sister  Family Psychiatric  History: none Social History:  History  Alcohol Use  . Yes    Comment: 1-2 cans of beer weekly     History  Drug Use  . Types: Cocaine    Comment: relapsed and used Cocaine on Friday 12/2014)    Social History   Social History  . Marital status: Single    Spouse name: N/A  . Number of children: 1  . Years of education: College    Occupational History  .  Other    disabled   Social History Main Topics  . Smoking status: Current Every Day Smoker    Packs/day: 0.50    Years: 20.00    Types: Cigarettes  . Smokeless tobacco: Never Used  . Alcohol use Yes     Comment: 1-2 cans of beer weekly  . Drug use:     Types: Cocaine     Comment: relapsed and used Cocaine on Friday 12/2014)  . Sexual activity: No   Other Topics Concern  . None   Social History Narrative   Patient lives at home alone.   Caffeine Use: quit 2 yrs ago   Additional Social History:    Allergies:   Allergies  Allergen Reactions  . Codeine Hives and Nausea And Vomiting  . Flagyl [Metronidazole] Other (See Comments)    Crazy   . Imipramine Rash  . Meloxicam Itching  . Metoprolol Tartrate Itching and Rash  . Vistaril [Hydroxyzine Hcl] Itching and Rash    Labs:  Results for orders placed or performed during the hospital encounter of 06/27/16 (from the past 48 hour(s))  Comprehensive metabolic panel     Status: Abnormal   Collection Time: 06/27/16 10:48 AM  Result Value Ref Range   Sodium 137 135 - 145 mmol/L   Potassium 3.4 (L) 3.5 - 5.1 mmol/L   Chloride 104 101 - 111 mmol/L   CO2 24 22 - 32 mmol/L   Glucose, Bld 103 (H) 65 - 99 mg/dL   BUN 11 6 - 20 mg/dL   Creatinine, Ser 0.62 0.44 - 1.00 mg/dL   Calcium 9.0 8.9 - 10.3 mg/dL   Total Protein 7.3 6.5 - 8.1 g/dL   Albumin 4.2 3.5 - 5.0 g/dL   AST 27 15 - 41 U/L   ALT 14 14 - 54 U/L   Alkaline Phosphatase 59 38 - 126 U/L   Total Bilirubin 1.1 0.3 - 1.2 mg/dL   GFR calc non Af Amer >60 >60 mL/min   GFR calc Af Amer >60 >60 mL/min    Comment: (NOTE) The eGFR has been calculated using the CKD EPI equation. This calculation has not been validated in all clinical situations. eGFR's persistently <60 mL/min signify possible Chronic Kidney Disease.    Anion gap 9 5 - 15  Ethanol     Status: None   Collection Time: 06/27/16 10:48 AM  Result Value Ref Range   Alcohol, Ethyl  (B) <5 <5 mg/dL    Comment:        LOWEST DETECTABLE LIMIT FOR SERUM ALCOHOL IS 5 mg/dL FOR MEDICAL PURPOSES ONLY   Salicylate level     Status: None   Collection Time: 06/27/16 10:48 AM  Result Value Ref Range   Salicylate Lvl <2.5 2.8 - 30.0 mg/dL  Acetaminophen level     Status: Abnormal   Collection Time: 06/27/16 10:48 AM  Result Value Ref Range   Acetaminophen (Tylenol), Serum <10 (L) 10 - 30 ug/mL    Comment:  THERAPEUTIC CONCENTRATIONS VARY SIGNIFICANTLY. A RANGE OF 10-30 ug/mL MAY BE AN EFFECTIVE CONCENTRATION FOR MANY PATIENTS. HOWEVER, SOME ARE BEST TREATED AT CONCENTRATIONS OUTSIDE THIS RANGE. ACETAMINOPHEN CONCENTRATIONS >150 ug/mL AT 4 HOURS AFTER INGESTION AND >50 ug/mL AT 12 HOURS AFTER INGESTION ARE OFTEN ASSOCIATED WITH TOXIC REACTIONS.   cbc     Status: None   Collection Time: 06/27/16 10:48 AM  Result Value Ref Range   WBC 4.4 4.0 - 10.5 K/uL   RBC 4.24 3.87 - 5.11 MIL/uL   Hemoglobin 13.5 12.0 - 15.0 g/dL   HCT 39.9 36.0 - 46.0 %   MCV 94.1 78.0 - 100.0 fL   MCH 31.8 26.0 - 34.0 pg   MCHC 33.8 30.0 - 36.0 g/dL   RDW 14.3 11.5 - 15.5 %   Platelets 313 150 - 400 K/uL  Protime-INR     Status: None   Collection Time: 06/27/16 10:48 AM  Result Value Ref Range   Prothrombin Time 13.3 11.4 - 15.2 seconds   INR 1.01   APTT     Status: None   Collection Time: 06/27/16 10:48 AM  Result Value Ref Range   aPTT 27 24 - 36 seconds  CK     Status: None   Collection Time: 06/27/16 10:48 AM  Result Value Ref Range   Total CK 44 38 - 234 U/L  Differential     Status: None   Collection Time: 06/27/16 10:48 AM  Result Value Ref Range   Neutrophils Relative % 45 %   Lymphocytes Relative 40 %   Monocytes Relative 8 %   Eosinophils Relative 4 %   Basophils Relative 3 %   Neutro Abs 1.9 1.7 - 7.7 K/uL   Lymphs Abs 1.8 0.7 - 4.0 K/uL   Monocytes Absolute 0.4 0.1 - 1.0 K/uL   Eosinophils Absolute 0.2 0.0 - 0.7 K/uL   Basophils Absolute 0.1 0.0 - 0.1  K/uL   Smear Review MORPHOLOGY UNREMARKABLE   I-stat troponin, ED     Status: None   Collection Time: 06/27/16 10:50 AM  Result Value Ref Range   Troponin i, poc 0.01 0.00 - 0.08 ng/mL   Comment 3            Comment: Due to the release kinetics of cTnI, a negative result within the first hours of the onset of symptoms does not rule out myocardial infarction with certainty. If myocardial infarction is still suspected, repeat the test at appropriate intervals.   Rapid urine drug screen (hospital performed)     Status: None   Collection Time: 06/27/16 11:04 AM  Result Value Ref Range   Opiates NONE DETECTED NONE DETECTED   Cocaine NONE DETECTED NONE DETECTED   Benzodiazepines NONE DETECTED NONE DETECTED   Amphetamines NONE DETECTED NONE DETECTED   Tetrahydrocannabinol NONE DETECTED NONE DETECTED   Barbiturates NONE DETECTED NONE DETECTED    Comment:        DRUG SCREEN FOR MEDICAL PURPOSES ONLY.  IF CONFIRMATION IS NEEDED FOR ANY PURPOSE, NOTIFY LAB WITHIN 5 DAYS.        LOWEST DETECTABLE LIMITS FOR URINE DRUG SCREEN Drug Class       Cutoff (ng/mL) Amphetamine      1000 Barbiturate      200 Benzodiazepine   846 Tricyclics       659 Opiates          300 Cocaine          300 THC  50   Urinalysis, Routine w reflex microscopic (not at [facility])     Status: None   Collection Time: 06/27/16 11:04 AM  Result Value Ref Range   Color, Urine YELLOW YELLOW   APPearance CLEAR CLEAR   Specific Gravity, Urine 1.009 1.005 - 1.030   pH 5.5 5.0 - 8.0   Glucose, UA NEGATIVE NEGATIVE mg/dL   Hgb urine dipstick NEGATIVE NEGATIVE   Bilirubin Urine NEGATIVE NEGATIVE   Ketones, ur NEGATIVE NEGATIVE mg/dL   Protein, ur NEGATIVE NEGATIVE mg/dL   Nitrite NEGATIVE NEGATIVE   Leukocytes, UA NEGATIVE NEGATIVE    Comment: MICROSCOPIC NOT DONE ON URINES WITH NEGATIVE PROTEIN, BLOOD, LEUKOCYTES, NITRITE, OR GLUCOSE <1000 mg/dL.  I-Stat Chem 8, ED  (not at [facility])     Status: Abnormal    Collection Time: 06/27/16 11:09 AM  Result Value Ref Range   Sodium 140 135 - 145 mmol/L   Potassium 3.5 3.5 - 5.1 mmol/L   Chloride 103 101 - 111 mmol/L   BUN 9 6 - 20 mg/dL   Creatinine, Ser 0.60 0.44 - 1.00 mg/dL   Glucose, Bld 102 (H) 65 - 99 mg/dL   Calcium, Ion 1.14 (L) 1.15 - 1.40 mmol/L   TCO2 25 0 - 100 mmol/L   Hemoglobin 15.3 (H) 12.0 - 15.0 g/dL   HCT 45.0 36.0 - 46.0 %    Current Facility-Administered Medications  Medication Dose Route Frequency Provider Last Rate Last Dose  . acetaminophen (TYLENOL) tablet 650 mg  650 mg Oral Q4H PRN Sherwood Gambler, MD   650 mg at 06/28/16 0748  . albuterol (PROVENTIL HFA;VENTOLIN HFA) 108 (90 Base) MCG/ACT inhaler 2 puff  2 puff Inhalation Q6H PRN Sherwood Gambler, MD      . aspirin EC tablet 81 mg  81 mg Oral Daily Sherwood Gambler, MD   81 mg at 06/28/16 0751  . brimonidine (ALPHAGAN) 0.2 % ophthalmic solution 1 drop  1 drop Both Eyes BID Sherwood Gambler, MD   1 drop at 06/28/16 0753   And  . timolol (TIMOPTIC) 0.5 % ophthalmic solution 1 drop  1 drop Both Eyes BID Sherwood Gambler, MD   1 drop at 06/28/16 0753  . lisinopril (PRINIVIL,ZESTRIL) tablet 20 mg  20 mg Oral Daily Sherwood Gambler, MD   20 mg at 06/28/16 0750   And  . hydrochlorothiazide (MICROZIDE) capsule 12.5 mg  12.5 mg Oral Daily Sherwood Gambler, MD   12.5 mg at 06/28/16 0750  . latanoprost (XALATAN) 0.005 % ophthalmic solution 2 drop  2 drop Both Eyes QHS Sherwood Gambler, MD   2 drop at 06/27/16 2132  . linaclotide (LINZESS) capsule 145 mcg  145 mcg Oral QAC breakfast Sherwood Gambler, MD   145 mcg at 06/28/16 0747  . metoprolol succinate (TOPROL-XL) 24 hr tablet 25 mg  25 mg Oral Daily Sherwood Gambler, MD   25 mg at 06/28/16 0749  . nicotine (NICODERM CQ - dosed in mg/24 hours) patch 21 mg  21 mg Transdermal Daily Sherwood Gambler, MD   21 mg at 06/28/16 0754  . omega-3 acid ethyl esters (LOVAZA) capsule 1 g  1 g Oral Daily Sherwood Gambler, MD   1 g at 06/28/16 0750  . ondansetron  (ZOFRAN) tablet 4 mg  4 mg Oral Q8H PRN Sherwood Gambler, MD      . pantoprazole (PROTONIX) EC tablet 80 mg  80 mg Oral Daily Sherwood Gambler, MD   80 mg at 06/28/16 0748  . tiZANidine (ZANAFLEX) tablet  4 mg  4 mg Oral TID PRN Sherwood Gambler, MD   4 mg at 06/28/16 0752  . umeclidinium bromide (INCRUSE ELLIPTA) 62.5 MCG/INH 1 puff  1 puff Inhalation Daily Sherwood Gambler, MD   1 puff at 06/28/16 0754  . venlafaxine XR (EFFEXOR-XR) 24 hr capsule 75 mg  75 mg Oral Q breakfast Sherwood Gambler, MD   75 mg at 06/28/16 0751  . [START ON 07/03/2016] Vitamin D (Ergocalciferol) (DRISDOL) capsule 50,000 Units  50,000 Units Oral Weekly Sherwood Gambler, MD      . zolpidem (AMBIEN) tablet 10 mg  10 mg Oral QHS PRN Sherwood Gambler, MD   10 mg at 06/27/16 2132   Current Outpatient Prescriptions  Medication Sig Dispense Refill  . albuterol (PROVENTIL HFA;VENTOLIN HFA) 108 (90 BASE) MCG/ACT inhaler Inhale 2 puffs into the lungs every 6 (six) hours as needed. For wheezing    . aspirin 81 MG EC tablet Take 81 mg by mouth daily. Swallow whole.    . brimonidine-timolol (COMBIGAN) 0.2-0.5 % ophthalmic solution Place 1 drop into both eyes every 12 (twelve) hours.     Marland Kitchen HYDROcodone-acetaminophen (NORCO/VICODIN) 5-325 MG per tablet Take 1 tablet by mouth daily as needed. pain  0  . ibuprofen (ADVIL,MOTRIN) 800 MG tablet Take 800 mg by mouth 2 (two) times daily as needed for moderate pain.     Marland Kitchen latanoprost (XALATAN) 0.005 % ophthalmic solution Place 2 drops into both eyes at bedtime.    Marland Kitchen linaclotide (LINZESS) 145 MCG CAPS capsule Take 145 mcg by mouth daily before breakfast.    . lisinopril-hydrochlorothiazide (PRINZIDE,ZESTORETIC) 20-12.5 MG per tablet Take 1 tablet by mouth daily.    . metoprolol succinate (TOPROL-XL) 50 MG 24 hr tablet Take 25 mg by mouth daily. Take with or immediately following a meal.    . Omega-3 Fatty Acids (FISH OIL PO) Take 1 capsule by mouth daily.    Marland Kitchen omeprazole (PRILOSEC) 20 MG capsule Take 40 mg  by mouth every morning.     Marland Kitchen tiZANidine (ZANAFLEX) 4 MG capsule Take 1 capsule (4 mg total) by mouth 3 (three) times daily. (Patient taking differently: Take 4 mg by mouth 3 (three) times daily as needed for muscle spasms. ) 30 capsule 0  . tiZANidine (ZANAFLEX) 4 MG tablet Take 4 mg by mouth 2 (two) times daily as needed for muscle spasms.     Marland Kitchen umeclidinium bromide (INCRUSE ELLIPTA) 62.5 MCG/INH AEPB Inhale 1 puff into the lungs daily.    Marland Kitchen venlafaxine XR (EFFEXOR-XR) 75 MG 24 hr capsule Take 75 mg by mouth daily with breakfast.    . Vitamin D, Ergocalciferol, (DRISDOL) 50000 units CAPS capsule Take 50,000 Units by mouth once a week.  5  . zolpidem (AMBIEN) 10 MG tablet Take 1 tablet (10 mg total) by mouth at bedtime as needed for sleep. 30 tablet 0    Musculoskeletal: Strength & Muscle Tone: within normal limits Gait & Station: normal Patient leans: N/A  Psychiatric Specialty Exam: Physical Exam  Constitutional: She is oriented to person, place, and time. She appears well-developed and well-nourished.  HENT:  Head: Normocephalic.  Neck: Normal range of motion.  Respiratory: Effort normal.  Musculoskeletal: Normal range of motion.  Neurological: She is alert and oriented to person, place, and time.  Skin: Skin is warm and dry.  Psychiatric: Her speech is normal and behavior is normal. Judgment and thought content normal. Cognition and memory are normal. She exhibits a depressed mood.    Review of  Systems  Constitutional: Negative.   HENT: Negative.   Eyes: Negative.   Respiratory: Negative.   Cardiovascular: Negative.   Gastrointestinal: Negative.   Genitourinary: Negative.   Musculoskeletal: Negative.   Skin: Negative.   Neurological: Negative.   Endo/Heme/Allergies: Negative.   Psychiatric/Behavioral: Positive for depression.    Blood pressure 171/81, pulse 77, temperature 98.5 F (36.9 C), temperature source Oral, resp. rate 20, height '5\' 3"'$  (1.6 m), weight 64.4 kg (142  lb), SpO2 99 %.Body mass index is 25.15 kg/m.  General Appearance: Casual  Eye Contact:  Good  Speech:  Normal Rate  Volume:  Normal  Mood:  Depressed, mild  Affect:  Congruent  Thought Process:  Coherent and Descriptions of Associations: Intact  Orientation:  Full (Time, Place, and Person)  Thought Content:  WDL  Suicidal Thoughts:  No  Homicidal Thoughts:  No  Memory:  Immediate;   Good Recent;   Good Remote;   Good  Judgement:  Fair  Insight:  Fair  Psychomotor Activity:  Normal  Concentration:  Concentration: Good and Attention Span: Good  Recall:  Good  Fund of Knowledge:  Good  Language:  Good  Akathisia:  No  Handed:  Right  AIMS (if indicated):     Assets:  Leisure Time Physical Health Resilience Social Support  ADL's:  Intact  Cognition:  WNL  Sleep:        Treatment Plan Summary: Daily contact with patient to assess and evaluate symptoms and progress in treatment, Medication management and Plan bipolar affective disorder, most recent episode depressed, mild:  -Crisis stabilization -Medication management:  Continued medical medications except opiates along with her Effexor 75 mg daily for depression and Ambien 10 mg at bedtime for sleep.  Discontinued the Ambien 10 mg at bedtime at discharge and started Trazodone 100 mg at bedtime for sleep. -Individual counseling  Disposition: No evidence of imminent risk to self or others at present.    Waylan Boga, NP 06/28/2016 10:47 AM  Patient seen face-to-face for psychiatric evaluation, chart reviewed and case discussed with the physician extender and developed treatment plan. Reviewed the information documented and agree with the treatment plan. Corena Pilgrim, MD

## 2016-06-28 NOTE — ED Notes (Signed)
Patient ride is here to pick her up. Patient left in the care of her friend

## 2016-06-28 NOTE — Discharge Instructions (Signed)
Patient to follow up with outpatient providers

## 2016-06-28 NOTE — [facility] Suicide Risk Assessment (Signed)
Suicide Risk Assessment  Discharge Assessment   [facility]   Principal Problem: Bipolar disorder with depression [facility](HCC) Discharge Diagnoses:  Patient Active Problem List   Diagnosis Date Noted  . Bipolar disorder with depression ([facility]) [F31.30] 06/28/2016    Priority: High  . Chest pain [R07.9] 06/09/2014  . CAP (community acquired pneumonia) [J18.9] 06/09/2014  . COPD (chronic obstructive pulmonary disease) ([facility]) [J44.9]   . Fibromyalgia [M79.7]   . Bipolar 1 disorder ([facility]) [F31.9]   . GERD (gastroesophageal reflux disease) [K21.9]     Total Time spent with patient: 45 minutes  Musculoskeletal: Strength & Muscle Tone: within normal limits Gait & Station: normal Patient leans: N/A  Psychiatric Specialty Exam: Physical Exam  Constitutional: She is oriented to person, place, and time. She appears well-developed and well-nourished.  HENT:  Head: Normocephalic.  Neck: Normal range of motion.  Respiratory: Effort normal.  Musculoskeletal: Normal range of motion.  Neurological: She is alert and oriented to person, place, and time.  Skin: Skin is warm and dry.  Psychiatric: Her speech is normal and behavior is normal. Judgment and thought content normal. Cognition and memory are normal. She exhibits a depressed mood.    Review of Systems  Constitutional: Negative.   HENT: Negative.   Eyes: Negative.   Respiratory: Negative.   Cardiovascular: Negative.   Gastrointestinal: Negative.   Genitourinary: Negative.   Musculoskeletal: Negative.   Skin: Negative.   Neurological: Negative.   Endo/Heme/Allergies: Negative.   Psychiatric/Behavioral: Positive for depression.    Blood pressure 171/81, pulse 77, temperature 98.5 F (36.9 C), temperature source Oral, resp. rate 20, height 5\' 3"  (1.6 m), weight 64.4 kg (142 lb), SpO2 99 %.Body mass index is 25.15 kg/m.  General Appearance: Casual  Eye Contact:  Good  Speech:  Normal Rate  Volume:  Normal   Mood:  Depressed, mild  Affect:  Congruent  Thought Process:  Coherent and Descriptions of Associations: Intact  Orientation:  Full (Time, Place, and Person)  Thought Content:  WDL  Suicidal Thoughts:  No  Homicidal Thoughts:  No  Memory:  Immediate;   Good Recent;   Good Remote;   Good  Judgement:  Fair  Insight:  Fair  Psychomotor Activity:  Normal  Concentration:  Concentration: Good and Attention Span: Good  Recall:  Good  Fund of Knowledge:  Good  Language:  Good  Akathisia:  No  Handed:  Right  AIMS (if indicated):     Assets:  Leisure Time Physical Health Resilience Social Support  ADL's:  Intact  Cognition:  WNL  Sleep:       Mental Status Per Nursing Assessment::   On Admission:   suicidal ideations  Demographic Factors:  Living alone  Loss Factors: NA  Historical Factors: NA  Risk Reduction Factors:   Sense of responsibility to family, Positive social support and Positive therapeutic relationship  Continued Clinical Symptoms:  Depression, mild  Cognitive Features That Contribute To Risk:  None    Suicide Risk:  Minimal: No identifiable suicidal ideation.  Patients presenting with no risk factors but with morbid ruminations; may be classified as minimal risk based on the severity of the depressive symptoms    Plan Of Care/Follow-up recommendations:  Activity:  as tolerated Diet:  heart healthy diet  LORD, JAMISON, NP 06/28/2016, 11:00 AM

## 2016-06-28 NOTE — ED Notes (Signed)
Patient given discharge information and she called her friend to give her a ride home. Patient was given her belongings from the locker.

## 2016-06-28 NOTE — [facility] Assessment (Signed)
[facility] Patient will be discharged this date.

## 2016-07-30 ENCOUNTER — Encounter (HOSPITAL_COMMUNITY): Payer: Self-pay

## 2016-07-30 ENCOUNTER — Emergency Department (HOSPITAL_COMMUNITY)
Admission: EM | Admit: 2016-07-30 | Discharge: 2016-07-31 | Disposition: A | Discharge status: Home / Self Care | Payer: Medicare Other | Attending: Emergency Medicine | Admitting: Emergency Medicine

## 2016-07-30 DIAGNOSIS — F10929 Alcohol use, unspecified with intoxication, unspecified: Secondary | ICD-10-CM

## 2016-07-30 DIAGNOSIS — F10129 Alcohol abuse with intoxication, unspecified: Secondary | ICD-10-CM | POA: Diagnosis not present

## 2016-07-30 DIAGNOSIS — F1010 Alcohol abuse, uncomplicated: Secondary | ICD-10-CM | POA: Diagnosis not present

## 2016-07-30 DIAGNOSIS — R45851 Suicidal ideations: Secondary | ICD-10-CM | POA: Diagnosis present

## 2016-07-30 DIAGNOSIS — F3130 Bipolar disorder, current episode depressed, mild or moderate severity, unspecified: Secondary | ICD-10-CM | POA: Diagnosis not present

## 2016-07-30 DIAGNOSIS — Z7951 Long term (current) use of inhaled steroids: Secondary | ICD-10-CM | POA: Insufficient documentation

## 2016-07-30 DIAGNOSIS — T5192XA Toxic effect of unspecified alcohol, intentional self-harm, initial encounter: Secondary | ICD-10-CM | POA: Diagnosis not present

## 2016-07-30 DIAGNOSIS — T50902A Poisoning by unspecified drugs, medicaments and biological substances, intentional self-harm, initial encounter: Secondary | ICD-10-CM

## 2016-07-30 DIAGNOSIS — F319 Bipolar disorder, unspecified: Secondary | ICD-10-CM | POA: Diagnosis not present

## 2016-07-30 DIAGNOSIS — Z79899 Other long term (current) drug therapy: Secondary | ICD-10-CM | POA: Diagnosis not present

## 2016-07-30 DIAGNOSIS — F17210 Nicotine dependence, cigarettes, uncomplicated: Secondary | ICD-10-CM | POA: Diagnosis not present

## 2016-07-30 DIAGNOSIS — Z811 Family history of alcohol abuse and dependence: Secondary | ICD-10-CM | POA: Diagnosis not present

## 2016-07-30 DIAGNOSIS — Z7982 Long term (current) use of aspirin: Secondary | ICD-10-CM | POA: Insufficient documentation

## 2016-07-30 DIAGNOSIS — I10 Essential (primary) hypertension: Secondary | ICD-10-CM | POA: Insufficient documentation

## 2016-07-30 DIAGNOSIS — Z833 Family history of diabetes mellitus: Secondary | ICD-10-CM | POA: Diagnosis not present

## 2016-07-30 DIAGNOSIS — J449 Chronic obstructive pulmonary disease, unspecified: Secondary | ICD-10-CM | POA: Diagnosis not present

## 2016-07-30 LAB — RAPID URINE DRUG SCREEN, HOSP PERFORMED
AMPHETAMINES: NOT DETECTED
Barbiturates: NOT DETECTED
Benzodiazepines: NOT DETECTED
Cocaine: NOT DETECTED
Opiates: NOT DETECTED
TETRAHYDROCANNABINOL: NOT DETECTED

## 2016-07-30 LAB — CBC WITH DIFFERENTIAL/PLATELET
Basophils Absolute: 0.1 10*3/uL (ref 0.0–0.1)
Basophils Relative: 1 %
EOS PCT: 6 %
Eosinophils Absolute: 0.4 10*3/uL (ref 0.0–0.7)
HCT: 39.2 % (ref 36.0–46.0)
HEMOGLOBIN: 13.7 g/dL (ref 12.0–15.0)
LYMPHS ABS: 2.9 10*3/uL (ref 0.7–4.0)
LYMPHS PCT: 40 %
MCH: 31.9 pg (ref 26.0–34.0)
MCHC: 34.9 g/dL (ref 30.0–36.0)
MCV: 91.4 fL (ref 78.0–100.0)
MONOS PCT: 8 %
Monocytes Absolute: 0.6 10*3/uL (ref 0.1–1.0)
Neutro Abs: 3.2 10*3/uL (ref 1.7–7.7)
Neutrophils Relative %: 45 %
PLATELETS: 323 10*3/uL (ref 150–400)
RBC: 4.29 MIL/uL (ref 3.87–5.11)
RDW: 14.4 % (ref 11.5–15.5)
WBC: 7.2 10*3/uL (ref 4.0–10.5)

## 2016-07-30 LAB — URINALYSIS, ROUTINE W REFLEX MICROSCOPIC
Bilirubin Urine: NEGATIVE
GLUCOSE, UA: NEGATIVE mg/dL
Hgb urine dipstick: NEGATIVE
Ketones, ur: NEGATIVE mg/dL
Leukocytes, UA: NEGATIVE
Nitrite: NEGATIVE
Protein, ur: NEGATIVE mg/dL
SPECIFIC GRAVITY, URINE: 1.005 (ref 1.005–1.030)
pH: 5.5 (ref 5.0–8.0)

## 2016-07-30 LAB — I-STAT CHEM 8, ED
BUN: 10 mg/dL (ref 6–20)
Calcium, Ion: 1.17 mmol/L (ref 1.15–1.40)
Chloride: 105 mmol/L (ref 101–111)
Creatinine, Ser: 1 mg/dL (ref 0.44–1.00)
Glucose, Bld: 68 mg/dL (ref 65–99)
HCT: 45 % (ref 36.0–46.0)
Hemoglobin: 15.3 g/dL — ABNORMAL HIGH (ref 12.0–15.0)
Potassium: 3.2 mmol/L — ABNORMAL LOW (ref 3.5–5.1)
SODIUM: 143 mmol/L (ref 135–145)
TCO2: 22 mmol/L (ref 0–100)

## 2016-07-30 LAB — HEPATIC FUNCTION PANEL
ALT: 12 U/L — ABNORMAL LOW (ref 14–54)
AST: 18 U/L (ref 15–41)
Albumin: 4.3 g/dL (ref 3.5–5.0)
Alkaline Phosphatase: 66 U/L (ref 38–126)
BILIRUBIN DIRECT: 0.1 mg/dL (ref 0.1–0.5)
BILIRUBIN TOTAL: 0.6 mg/dL (ref 0.3–1.2)
Indirect Bilirubin: 0.5 mg/dL (ref 0.3–0.9)
Total Protein: 7.1 g/dL (ref 6.5–8.1)

## 2016-07-30 LAB — ETHANOL: Alcohol, Ethyl (B): 110 mg/dL — ABNORMAL HIGH (ref <5)

## 2016-07-30 LAB — BLOOD GAS, VENOUS
Acid-base deficit: 1.7 mmol/L (ref 0.0–2.0)
Bicarbonate: 22.9 mmol/L (ref 20.0–28.0)
O2 Saturation: 73.1 %
PH VEN: 7.369 (ref 7.250–7.430)
Patient temperature: 98.6
pCO2, Ven: 40.7 mmHg — ABNORMAL LOW (ref 44.0–60.0)
pO2, Ven: 41.5 mmHg (ref 32.0–45.0)

## 2016-07-30 LAB — ACETAMINOPHEN LEVEL

## 2016-07-30 LAB — SALICYLATE LEVEL

## 2016-07-30 LAB — I-STAT BETA HCG BLOOD, ED (MC, WL, AP ONLY)

## 2016-07-30 MED ORDER — ZOLPIDEM TARTRATE 5 MG PO TABS: 5.0000 mg | ORAL_TABLET | Freq: Every evening | ORAL | Status: DC | PRN

## 2016-07-30 MED ORDER — VENLAFAXINE HCL ER 75 MG PO CP24
75.0000 mg | ORAL_CAPSULE | Freq: Every day | ORAL | Status: DC
  Administered 2016-07-31: 75 mg via ORAL
  Filled 2016-07-30: qty 1

## 2016-07-30 MED ORDER — TIMOLOL MALEATE 0.5 % OP SOLN
1.0000 [drp] | Freq: Two times a day (BID) | OPHTHALMIC | Status: DC
  Administered 2016-07-30 – 2016-07-31 (×2): 1 [drp] via OPHTHALMIC
  Filled 2016-07-30: qty 5

## 2016-07-30 MED ORDER — ONDANSETRON HCL 4 MG PO TABS: 4.0000 mg | ORAL_TABLET | Freq: Three times a day (TID) | ORAL | Status: DC | PRN

## 2016-07-30 MED ORDER — ACETAMINOPHEN 325 MG PO TABS
650.0000 mg | ORAL_TABLET | ORAL | Status: DC | PRN
  Administered 2016-07-31: 650 mg via ORAL
  Filled 2016-07-30 (×2): qty 2

## 2016-07-30 MED ORDER — NICOTINE 21 MG/24HR TD PT24: 21.0000 mg | MEDICATED_PATCH | Freq: Every day | TRANSDERMAL | Status: DC | PRN

## 2016-07-30 MED ORDER — TRAZODONE HCL 100 MG PO TABS
100.0000 mg | ORAL_TABLET | Freq: Every day | ORAL | Status: DC
  Administered 2016-07-30: 100 mg via ORAL
  Filled 2016-07-30: qty 1

## 2016-07-30 MED ORDER — LATANOPROST 0.005 % OP SOLN
2.0000 [drp] | Freq: Every day | OPHTHALMIC | Status: DC
  Administered 2016-07-30: 2 [drp] via OPHTHALMIC
  Filled 2016-07-30: qty 2.5

## 2016-07-30 MED ORDER — LISINOPRIL-HYDROCHLOROTHIAZIDE 20-12.5 MG PO TABS: 1.0000 | ORAL_TABLET | Freq: Every day | ORAL | Status: DC

## 2016-07-30 MED ORDER — LINACLOTIDE 145 MCG PO CAPS
145.0000 ug | ORAL_CAPSULE | Freq: Every day | ORAL | Status: DC
  Administered 2016-07-31: 145 ug via ORAL
  Filled 2016-07-30: qty 1

## 2016-07-30 MED ORDER — BRIMONIDINE TARTRATE 0.2 % OP SOLN
1.0000 [drp] | Freq: Two times a day (BID) | OPHTHALMIC | Status: DC
  Administered 2016-07-30 – 2016-07-31 (×2): 1 [drp] via OPHTHALMIC
  Filled 2016-07-30: qty 5

## 2016-07-30 MED ORDER — ASPIRIN EC 81 MG PO TBEC
81.0000 mg | DELAYED_RELEASE_TABLET | Freq: Every day | ORAL | Status: DC
  Administered 2016-07-30 – 2016-07-31 (×2): 81 mg via ORAL
  Filled 2016-07-30 (×2): qty 1

## 2016-07-30 MED ORDER — LORAZEPAM 2 MG/ML IJ SOLN
1.0000 mg | Freq: Once | INTRAMUSCULAR | Status: AC
  Administered 2016-07-30: 1 mg via INTRAVENOUS
  Filled 2016-07-30: qty 1

## 2016-07-30 MED ORDER — PANTOPRAZOLE SODIUM 40 MG PO TBEC
40.0000 mg | DELAYED_RELEASE_TABLET | Freq: Every day | ORAL | Status: DC
Start: 2016-07-30 — End: 2016-07-31
  Administered 2016-07-30 – 2016-07-31 (×2): 40 mg via ORAL
  Filled 2016-07-30 (×2): qty 1

## 2016-07-30 MED ORDER — LISINOPRIL 20 MG PO TABS
20.0000 mg | ORAL_TABLET | Freq: Every day | ORAL | Status: DC
  Administered 2016-07-30 – 2016-07-31 (×2): 20 mg via ORAL
  Filled 2016-07-30 (×2): qty 1

## 2016-07-30 MED ORDER — UMECLIDINIUM BROMIDE 62.5 MCG/INH IN AEPB
1.0000 | INHALATION_SPRAY | Freq: Every day | RESPIRATORY_TRACT | Status: DC
  Administered 2016-07-30 – 2016-07-31 (×2): 1 via RESPIRATORY_TRACT
  Filled 2016-07-30: qty 7

## 2016-07-30 MED ORDER — HYDROCHLOROTHIAZIDE 12.5 MG PO CAPS
12.5000 mg | ORAL_CAPSULE | Freq: Every day | ORAL | Status: DC
  Administered 2016-07-31: 12.5 mg via ORAL
  Filled 2016-07-30 (×2): qty 1

## 2016-07-30 NOTE — ED Notes (Signed)
Pt provided with requested sprite, Dr Effie ShyWentz ok'd

## 2016-07-30 NOTE — ED Triage Notes (Signed)
EMS were called by pt. Who told them she had taken several of her Zyprexa tablets because "I am tired of living because I always have (back) pain". Police are with her upon arrival and stay with her. Dr. Eulis Foster met her on arrival with plan to IVC.

## 2016-07-30 NOTE — ED Notes (Signed)
Bed: RESA Expected date:  Expected time:  Means of arrival:  Comments: EMS/O.D. 

## 2016-07-30 NOTE — ED Notes (Signed)
Bed: WA16 Expected date:  Expected time:  Means of arrival:  Comments: resa 

## 2016-07-30 NOTE — ED Notes (Signed)
No respiratory or acute distress noted resting in bed with eyes closed no reaction to medication noted Lyla SonCarrie RN aware of pts need for hospital sitter.

## 2016-07-30 NOTE — ED Notes (Signed)
Called poison control about pt taking zyprexa overdose [phone] nothing other that further observation needed informed Dr. Effie ShyWentz.

## 2016-07-30 NOTE — ED Notes (Signed)
Patient has 1 bag of belongings containing a wig, pants, top, bra, shoes, and purse. Security has wanded the patient and belongings. Patients purse has been cleared by security as well.

## 2016-07-30 NOTE — ED Notes (Signed)
No respiratory or acute distress noted alert and oriented x 3 no hospital sitter with pt Lyla Sonarrie RN charge nurse aware no reaction to medication noted.

## 2016-07-30 NOTE — ED Provider Notes (Signed)
[facility]-EMERGENCY DEPT Provider Note   CSN: [id number] Arrival date & time: 07/30/16  1754     History   Chief Complaint Chief Complaint  Patient presents with  . Suicidal  . Drug Overdose    HPI Cassie Harrell is a 65 y.o. female.  She is here for evaluation of overdose of Zyprexa, initially, 10 pills, size unknown, time of ingestion, unknown. She is a vague historian but does state, "I wanted to kill myself". She complains of chronic back pain, and states this is the reason for her distress. She is unable to specify anything additional.  Level V caveat- reluctant historian  HPI  Past Medical History:  Diagnosis Date  . Bipolar 1 disorder (HCC)   . Cocaine abuse   . COPD (chronic obstructive pulmonary disease) (HCC)   . Dissociative disorder   . GERD (gastroesophageal reflux disease)   . Glaucoma   . Hypertension   . Neuropathy of foot    bilateral  . Osteoarthritis   . PTSD (post-traumatic stress disorder)   . Sleep apnea     Patient Active Problem List   Diagnosis Date Noted  . Bipolar disorder with depression (HCC) 06/28/2016  . Chest pain 06/09/2014  . CAP (community acquired pneumonia) 06/09/2014  . COPD (chronic obstructive pulmonary disease) (HCC)   . Fibromyalgia   . Bipolar 1 disorder (HCC)   . GERD (gastroesophageal reflux disease)     Past Surgical History:  Procedure Laterality Date  . ABDOMINAL HYSTERECTOMY    . APPENDECTOMY    . CESAREAN SECTION    . EYE SURGERY    . FINGER SURGERY    . REVISION OF SCAR TISSUE RECTUS MUSCLE    . UVULOPALATOPHARYNGOPLASTY      OB History    Gravida Para Term Preterm AB Living   1             SAB TAB Ectopic Multiple Live Births                   Home Medications    Prior to Admission medications   Medication Sig Start Date End Date Taking? Authorizing Provider  albuterol (PROVENTIL HFA;VENTOLIN HFA) 108 (90 BASE) MCG/ACT inhaler Inhale 2 puffs into the lungs every 6 (six) hours as needed.  For wheezing    Historical Provider, MD  aspirin 81 MG EC tablet Take 81 mg by mouth daily. Swallow whole.    Historical Provider, MD  brimonidine-timolol (COMBIGAN) 0.2-0.5 % ophthalmic solution Place 1 drop into both eyes every 12 (twelve) hours.     Historical Provider, MD  HYDROcodone-acetaminophen (NORCO/VICODIN) 5-325 MG per tablet Take 1 tablet by mouth daily as needed. pain 02/01/15   Historical Provider, MD  ibuprofen (ADVIL,MOTRIN) 800 MG tablet Take 800 mg by mouth 2 (two) times daily as needed for moderate pain.  06/22/16   Historical Provider, MD  latanoprost (XALATAN) 0.005 % ophthalmic solution Place 2 drops into both eyes at bedtime.    Historical Provider, MD  linaclotide (LINZESS) 145 MCG CAPS capsule Take 145 mcg by mouth daily before breakfast.    Historical Provider, MD  lisinopril-hydrochlorothiazide (PRINZIDE,ZESTORETIC) 20-12.5 MG per tablet Take 1 tablet by mouth daily.    Historical Provider, MD  metoprolol succinate (TOPROL-XL) 50 MG 24 hr tablet Take 25 mg by mouth daily. Take with or immediately following a meal.    Historical Provider, MD  Omega-3 Fatty Acids (FISH OIL PO) Take 1 capsule by mouth daily.    Historical  Provider, MD  omeprazole (PRILOSEC) 20 MG capsule Take 40 mg by mouth every morning.     Historical Provider, MD  tiZANidine (ZANAFLEX) 4 MG capsule Take 1 capsule (4 mg total) by mouth 3 (three) times daily. Patient taking differently: Take 4 mg by mouth 3 (three) times daily as needed for muscle spasms.  02/23/15   Arby Barrette, MD  tiZANidine (ZANAFLEX) 4 MG tablet Take 4 mg by mouth 2 (two) times daily as needed for muscle spasms.     Historical Provider, MD  traZODone (DESYREL) 100 MG tablet Take 1 tablet (100 mg total) by mouth at bedtime. 06/28/16   Charm Rings, NP  umeclidinium bromide (INCRUSE ELLIPTA) 62.5 MCG/INH AEPB Inhale 1 puff into the lungs daily.    Historical Provider, MD  venlafaxine XR (EFFEXOR-XR) 75 MG 24 hr capsule Take 75 mg by mouth  daily with breakfast.    Historical Provider, MD  Vitamin D, Ergocalciferol, (DRISDOL) 50000 units CAPS capsule Take 50,000 Units by mouth once a week. 06/16/16   Historical Provider, MD  zolpidem (AMBIEN) 10 MG tablet Take 1 tablet (10 mg total) by mouth at bedtime as needed for sleep. 06/10/14 07/10/14  Calvert Cantor, MD    Family History Family History  Problem Relation Age of Onset  . Other Mother     natural  . Heart attack Father   . Other Sister     kidney transplant  . Alcoholism Brother   . Goiter Sister   . Diabetes Brother   . Diabetes Sister     Social History Social History  Substance Use Topics  . Smoking status: Current Every Day Smoker    Packs/day: 0.50    Years: 20.00    Types: Cigarettes  . Smokeless tobacco: Never Used  . Alcohol use Yes     Comment: 1-2 cans of beer weekly     Allergies   Codeine; Flagyl [metronidazole]; Imipramine; Meloxicam; Metoprolol tartrate; and Vistaril [hydroxyzine hcl]   Review of Systems Review of Systems  Unable to perform ROS: Mental status change     Physical Exam Updated Vital Signs BP 125/79 (BP Location: Left Arm)   Pulse 77   Temp 97.7 F (36.5 C) (Oral)   Resp 14   Ht 5\' 3"  (1.6 m)   Wt 144 lb (65.3 kg)   SpO2 99%   BMI 25.51 kg/m   Physical Exam  Constitutional: She is oriented to person, place, and time. She appears well-developed and well-nourished. No distress.  Appears older than stated age  HENT:  Head: Normocephalic and atraumatic.  Eyes: Conjunctivae and EOM are normal. Pupils are equal, round, and reactive to light.  Neck: Normal range of motion and phonation normal. Neck supple.  Cardiovascular: Normal rate and regular rhythm.   Pulmonary/Chest: Effort normal and breath sounds normal. She exhibits no tenderness.  Abdominal: Soft. She exhibits no distension. There is no tenderness. There is no guarding.  Musculoskeletal: Normal range of motion.  Neurological: She is alert and oriented to  person, place, and time. She exhibits normal muscle tone.  Poor historian. No dysarthria. Unable to assess for a aphasia. No gross neurologic asymmetry.  Skin: Skin is warm and dry.  Psychiatric: She has a normal mood and affect. Her behavior is normal. Judgment and thought content normal.  Agitated, appears depressed.  Nursing note and vitals reviewed.    ED Treatments / Results  Labs (all labs ordered are listed, but only abnormal results are displayed) Labs Reviewed  BLOOD GAS, VENOUS - Abnormal; Notable for the following:       Result Value   pCO2, Ven 40.7 (*)    All other components within normal limits  ACETAMINOPHEN LEVEL - Abnormal; Notable for the following:    Acetaminophen (Tylenol), Serum <10 (*)    All other components within normal limits  ETHANOL - Abnormal; Notable for the following:    Alcohol, Ethyl (B) 110 (*)    All other components within normal limits  HEPATIC FUNCTION PANEL - Abnormal; Notable for the following:    ALT 12 (*)    All other components within normal limits  I-STAT CHEM 8, ED - Abnormal; Notable for the following:    Potassium 3.2 (*)    Hemoglobin 15.3 (*)    All other components within normal limits  CBC WITH DIFFERENTIAL/PLATELET  URINALYSIS, ROUTINE W REFLEX MICROSCOPIC (NOT AT [facility])  RAPID URINE DRUG SCREEN, HOSP PERFORMED  SALICYLATE LEVEL  I-STAT BETA HCG BLOOD, ED ([facility], [facility], [facility] ONLY)    EKG  EKG Interpretation  Date/Time:  Sunday July 30 2016 18:02:41 EDT Ventricular Rate:  86 PR Interval:    QRS Duration: 120 QT Interval:  400 QTC Calculation: 479 R Axis:   -33 Text Interpretation:  Sinus rhythm Left ventricular hypertrophy Baseline wander in lead(s) V6 since last tracing no significant change Confirmed by Effie ShyWENTZ  MD, Mechele CollinELLIOTT ([id number]) on 07/30/2016 7:09:35 PM       Radiology No results found.  Procedures Procedures (including critical care time)  Medications Ordered in ED Medications  acetaminophen (TYLENOL)  tablet 650 mg (not administered)  zolpidem (AMBIEN) tablet 5 mg (not administered)  nicotine (NICODERM CQ - dosed in mg/24 hours) patch 21 mg (not administered)  ondansetron (ZOFRAN) tablet 4 mg (not administered)  aspirin EC tablet 81 mg (81 mg Oral Given 07/30/16 2215)  brimonidine (ALPHAGAN) 0.2 % ophthalmic solution 1 drop (1 drop Both Eyes Given 07/30/16 2216)  latanoprost (XALATAN) 0.005 % ophthalmic solution 2 drop (2 drops Both Eyes Given 07/30/16 2216)  linaclotide (LINZESS) capsule 145 mcg (not administered)  pantoprazole (PROTONIX) EC tablet 40 mg (40 mg Oral Given 07/30/16 2215)  traZODone (DESYREL) tablet 100 mg (100 mg Oral Given 07/30/16 2231)  umeclidinium bromide (INCRUSE ELLIPTA) 62.5 MCG/INH 1 puff (1 puff Inhalation Given 07/30/16 2216)  venlafaxine XR (EFFEXOR-XR) 24 hr capsule 75 mg (not administered)  timolol (TIMOPTIC) 0.5 % ophthalmic solution 1 drop (1 drop Both Eyes Given 07/30/16 2216)  lisinopril (PRINIVIL,ZESTRIL) tablet 20 mg (20 mg Oral Given 07/30/16 2215)  hydrochlorothiazide (MICROZIDE) capsule 12.5 mg (12.5 mg Oral Refused 07/30/16 2215)  LORazepam (ATIVAN) injection 1 mg (1 mg Intravenous Given 07/30/16 1832)     Initial Impression / Assessment and Plan / ED Course  I have reviewed the triage vital signs and the nursing notes.  Pertinent labs & imaging results that were available during my care of the patient were reviewed by me and considered in my medical decision making (see chart for details).  Clinical Course  Comment By Time  At this time, she is medically cleared for treatment by psychiatry Mancel BaleElliott Teneshia Hedeen, MD 10/15 2041    Medications  acetaminophen (TYLENOL) tablet 650 mg (not administered)  zolpidem (AMBIEN) tablet 5 mg (not administered)  nicotine (NICODERM CQ - dosed in mg/24 hours) patch 21 mg (not administered)  ondansetron (ZOFRAN) tablet 4 mg (not administered)  aspirin EC tablet 81 mg (81 mg Oral Given 07/30/16 2215)  brimonidine  (ALPHAGAN) 0.2 % ophthalmic  solution 1 drop (1 drop Both Eyes Given 07/30/16 2216)  latanoprost (XALATAN) 0.005 % ophthalmic solution 2 drop (2 drops Both Eyes Given 07/30/16 2216)  linaclotide (LINZESS) capsule 145 mcg (not administered)  pantoprazole (PROTONIX) EC tablet 40 mg (40 mg Oral Given 07/30/16 2215)  traZODone (DESYREL) tablet 100 mg (100 mg Oral Given 07/30/16 2231)  umeclidinium bromide (INCRUSE ELLIPTA) 62.5 MCG/INH 1 puff (1 puff Inhalation Given 07/30/16 2216)  venlafaxine XR (EFFEXOR-XR) 24 hr capsule 75 mg (not administered)  timolol (TIMOPTIC) 0.5 % ophthalmic solution 1 drop (1 drop Both Eyes Given 07/30/16 2216)  lisinopril (PRINIVIL,ZESTRIL) tablet 20 mg (20 mg Oral Given 07/30/16 2215)  hydrochlorothiazide (MICROZIDE) capsule 12.5 mg (12.5 mg Oral Refused 07/30/16 2215)  LORazepam (ATIVAN) injection 1 mg (1 mg Intravenous Given 07/30/16 1832)    Patient Vitals for the past 24 hrs:  BP Temp Temp src Pulse Resp SpO2 Height Weight  07/30/16 2326 125/79 - Oral 77 14 99 % - -  07/30/16 2324 125/79 - - 70 - - - -  07/30/16 2120 126/75 - - 71 - - - -  07/30/16 1922 123/77 97.7 F (36.5 C) Oral 95 18 96 % - -  07/30/16 1801 164/89 98.8 F (37.1 C) Oral 90 18 95 % 5\' 3"  (1.6 m) 144 lb (65.3 kg)   Involuntary commitment paperwork, and first opinion paperwork, filled out by me.  TTS consult- They plan on Geri-Psychiatric Admission  Final Clinical Impressions(s) / ED Diagnoses   Final diagnoses:  Suicidal ideation  Intentional drug overdose, initial encounter (HCC)  Alcoholic intoxication with complication (HCC)   Nontoxic overdose, intentional, with suicidal ideation. Nonspecific chronic back pain.  Nursing Notes Reviewed/ Care Coordinated, and agree without changes. Applicable Imaging Reviewed.  Interpretation of Laboratory Data incorporated into ED treatment  Plan- psychiatric admission, when bed is located and available.  New Prescriptions New Prescriptions     No medications on file     Mancel Bale, MD 07/30/16 2342
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
## 2016-07-31 DIAGNOSIS — T5192XA Toxic effect of unspecified alcohol, intentional self-harm, initial encounter: Secondary | ICD-10-CM | POA: Diagnosis not present

## 2016-07-31 DIAGNOSIS — F1014 Alcohol abuse with alcohol-induced mood disorder: Secondary | ICD-10-CM | POA: Insufficient documentation

## 2016-07-31 DIAGNOSIS — Z833 Family history of diabetes mellitus: Secondary | ICD-10-CM

## 2016-07-31 DIAGNOSIS — Z79899 Other long term (current) drug therapy: Secondary | ICD-10-CM

## 2016-07-31 DIAGNOSIS — Z8249 Family history of ischemic heart disease and other diseases of the circulatory system: Secondary | ICD-10-CM

## 2016-07-31 DIAGNOSIS — F1010 Alcohol abuse, uncomplicated: Secondary | ICD-10-CM

## 2016-07-31 DIAGNOSIS — Z811 Family history of alcohol abuse and dependence: Secondary | ICD-10-CM

## 2016-07-31 DIAGNOSIS — F3130 Bipolar disorder, current episode depressed, mild or moderate severity, unspecified: Secondary | ICD-10-CM

## 2016-07-31 DIAGNOSIS — Z791 Long term (current) use of non-steroidal anti-inflammatories (NSAID): Secondary | ICD-10-CM

## 2016-07-31 DIAGNOSIS — Z7982 Long term (current) use of aspirin: Secondary | ICD-10-CM

## 2016-07-31 DIAGNOSIS — F17210 Nicotine dependence, cigarettes, uncomplicated: Secondary | ICD-10-CM

## 2016-07-31 HISTORY — DX: Alcohol abuse with alcohol-induced mood disorder: F10.14

## 2016-07-31 MED ORDER — IBUPROFEN 800 MG PO TABS: 800.0000 mg | ORAL_TABLET | Freq: Four times a day (QID) | ORAL | Status: DC | PRN

## 2016-07-31 NOTE — ED Notes (Signed)
Patient is resting comfortably. 

## 2016-07-31 NOTE — BH Assessment (Signed)
[facility] Assessment Progress Note  Per Thedore MinsMojeed Akintayo, MD, this pt does not require psychiatric hospitalization at this time.  Pt presents under IVC initiated by EDP Mancel BaleElliott Wentz, MD, which Dr Jannifer FranklinAkintayo has rescinded.  [facility]  This has been included in pt's discharge instructions.  Pt's nurse, Sue Lushndrea, has been notified.  Doylene Canninghomas Tausha Milhoan, MA Triage Specialist [phone]

## 2016-07-31 NOTE — ED Notes (Signed)
Pt belongings returned at discharge

## 2016-07-31 NOTE — ED Notes (Signed)
Notified provider of patient back pain complaint explained to patient to f/u with PCP or return to ED as needed, pt verbalized understanding.

## 2016-07-31 NOTE — Discharge Instructions (Signed)
For your ongoing mental health needs, you are advised to follow up with [facility].  If you do not currently have an appointment, new and returning patients are seen at their walk-in clinic.  Walk-in hours are Monday - Friday from 8:00 am - 3:00 pm.  Walk-in patients are seen on a first come, first served basis.  Try to arrive as early as possible for he best chance of being seen the same day: ° °     [facility] °     [address] °     [place], [place] [address] °     [phone] °

## 2016-07-31 NOTE — Consult Note (Addendum)
[facility] Face-to-Face Psychiatry Consult   Reason for Consult:   Depression Referring Physician:  EDP Patient Identification: Cassie Harrell MRN:  [id number] Principal Diagnosis: Bipolar disorder with depression (HCC)  Total Time spent with patient: 45 minutes  Subjective:   Cassie Harrell is a 65 y.o. female patient does not warrant admission.  HPI:  65 yo female who presented to the ED with suicidal ideations after drinking alcohol.  Today, she has cleared the alcohol and is not suicidal or homicidal, no hallucinations or withdrawal symptoms.  Does not typically drink alcohol but was frustrated with her back pain.  Stable for discharge, patient of [facility].  Past Psychiatric History: bipolar disorder  Risk to Self: Suicidal Ideation: Not Currently Present (but says OD today was a suicide attempt) Suicidal Intent: Not Currently Present Is patient at risk for suicide?: No Suicidal Plan?: Not Currently Present Specify Current Suicidal Plan: Denies Access to Means: Denies Specify Access to Suicidal Means: none per patient What has been your use of drugs/alcohol within the last 12 months?:  (reports alcohol usage "often"; smokes cigarettes) How many times?:  (sts over 20 attempts) Other Self Harm Risks:  (none) Triggers for Past Attempts: Unpredictable Intentional Self Injurious Behavior: None Risk to Others: Homicidal Ideation: No (denies) Thoughts of Harm to Others: No Current Homicidal Intent: No Current Homicidal Plan: No Access to Homicidal Means: No Identified Victim:  (none reported) History of harm to others?: No (denies) Assessment of Violence: None Noted Violent Behavior Description:  (none reported) Does patient have access to weapons?: No (denies access to guns) Criminal Charges Pending?: No Does patient have a court date: No Prior Inpatient Therapy: Prior Inpatient Therapy: Yes Prior Therapy Dates:  (Multiple IP stays w last in 2013; nultiple ED stays ) Prior  Therapy Facilty/Provider(s):  ([facility], [facility] and [facility]) Reason for Treatment:  (Bipolar 1 diagnosed in 1994 per pt record) Prior Outpatient Therapy: Prior Outpatient Therapy: Yes Prior Therapy Dates:  (2017) Prior Therapy Facilty/Provider(s): [facility] Reason for Treatment:  (Bipolar 1; GAD; PTSD) Does patient have an ACCT team?: No Does patient have Intensive In-House Services?  : No Does patient have [facility] services? : Yes Does patient have [facility] services?: No  Past Medical History:  Past Medical History:  Diagnosis Date  . Bipolar 1 disorder (HCC)   . Cocaine abuse   . COPD (chronic obstructive pulmonary disease) (HCC)   . Dissociative disorder   . GERD (gastroesophageal reflux disease)   . Glaucoma   . Hypertension   . Neuropathy of foot    bilateral  . Osteoarthritis   . PTSD (post-traumatic stress disorder)   . Sleep apnea     Past Surgical History:  Procedure Laterality Date  . ABDOMINAL HYSTERECTOMY    . APPENDECTOMY    . CESAREAN SECTION    . EYE SURGERY    . FINGER SURGERY    . REVISION OF SCAR TISSUE RECTUS MUSCLE    . UVULOPALATOPHARYNGOPLASTY     Family History:  Family History  Problem Relation Age of Onset  . Other Mother     natural  . Heart attack Father   . Other Sister     kidney transplant  . Alcoholism Brother   . Goiter Sister   . Diabetes Brother   . Diabetes Sister    Family Psychiatric  History: none Social History:  History  Alcohol Use  . Yes    Comment: 1-2 cans of beer weekly     History  Drug  Use  . Types: Cocaine    Comment: relapsed and used Cocaine on Friday 12/2014)    Social History   Social History  . Marital status: Single    Spouse name: N/A  . Number of children: 1  . Years of education: College   Occupational History  .  Other    disabled   Social History Main Topics  . Smoking status: Current Every Day Smoker    Packs/day: 0.50    Years: 20.00    Types: Cigarettes  . Smokeless tobacco: Never Used   . Alcohol use Yes     Comment: 1-2 cans of beer weekly  . Drug use:     Types: Cocaine     Comment: relapsed and used Cocaine on Friday 12/2014)  . Sexual activity: No   Other Topics Concern  . None   Social History Narrative   Patient lives at home alone.   Caffeine Use: quit 2 yrs ago   Additional Social History:    Allergies:   Allergies  Allergen Reactions  . Codeine Hives and Nausea And Vomiting  . Flagyl [Metronidazole] Other (See Comments)    Crazy   . Imipramine Rash  . Meloxicam Itching  . Metoprolol Tartrate Itching and Rash  . Vistaril [Hydroxyzine Hcl] Itching and Rash    Labs:  Results for orders placed or performed during the hospital encounter of 07/30/16 (from the past 48 hour(s))  Urinalysis, Routine w reflex microscopic     Status: None   Collection Time: 07/30/16  5:57 PM  Result Value Ref Range   Color, Urine YELLOW YELLOW   APPearance CLEAR CLEAR   Specific Gravity, Urine 1.005 1.005 - 1.030   pH 5.5 5.0 - 8.0   Glucose, UA NEGATIVE NEGATIVE mg/dL   Hgb urine dipstick NEGATIVE NEGATIVE   Bilirubin Urine NEGATIVE NEGATIVE   Ketones, ur NEGATIVE NEGATIVE mg/dL   Protein, ur NEGATIVE NEGATIVE mg/dL   Nitrite NEGATIVE NEGATIVE   Leukocytes, UA NEGATIVE NEGATIVE    Comment: MICROSCOPIC NOT DONE ON URINES WITH NEGATIVE PROTEIN, BLOOD, LEUKOCYTES, NITRITE, OR GLUCOSE <1000 mg/dL.  Urine rapid drug screen (hosp performed)     Status: None   Collection Time: 07/30/16  5:57 PM  Result Value Ref Range   Opiates NONE DETECTED NONE DETECTED   Cocaine NONE DETECTED NONE DETECTED   Benzodiazepines NONE DETECTED NONE DETECTED   Amphetamines NONE DETECTED NONE DETECTED   Tetrahydrocannabinol NONE DETECTED NONE DETECTED   Barbiturates NONE DETECTED NONE DETECTED    Comment:        DRUG SCREEN FOR MEDICAL PURPOSES ONLY.  IF CONFIRMATION IS NEEDED FOR ANY PURPOSE, NOTIFY LAB WITHIN 5 DAYS.        LOWEST DETECTABLE LIMITS FOR URINE DRUG SCREEN Drug  Class       Cutoff (ng/mL) Amphetamine      1000 Barbiturate      200 Benzodiazepine   200 Tricyclics       300 Opiates          300 Cocaine          300 THC              50   CBC with Differential     Status: None   Collection Time: 07/30/16  6:25 PM  Result Value Ref Range   WBC 7.2 4.0 - 10.5 K/uL   RBC 4.29 3.87 - 5.11 MIL/uL   Hemoglobin 13.7 12.0 - 15.0 g/dL   HCT 39.2  36.0 - 46.0 %   MCV 91.4 78.0 - 100.0 fL   MCH 31.9 26.0 - 34.0 pg   MCHC 34.9 30.0 - 36.0 g/dL   RDW 13.214.4 44.011.5 - 10.215.5 %   Platelets 323 150 - 400 K/uL   Neutrophils Relative % 45 %   Neutro Abs 3.2 1.7 - 7.7 K/uL   Lymphocytes Relative 40 %   Lymphs Abs 2.9 0.7 - 4.0 K/uL   Monocytes Relative 8 %   Monocytes Absolute 0.6 0.1 - 1.0 K/uL   Eosinophils Relative 6 %   Eosinophils Absolute 0.4 0.0 - 0.7 K/uL   Basophils Relative 1 %   Basophils Absolute 0.1 0.0 - 0.1 K/uL  Salicylate level     Status: None   Collection Time: 07/30/16  6:25 PM  Result Value Ref Range   Salicylate Lvl <7.0 2.8 - 30.0 mg/dL  Acetaminophen level     Status: Abnormal   Collection Time: 07/30/16  6:25 PM  Result Value Ref Range   Acetaminophen (Tylenol), Serum <10 (L) 10 - 30 ug/mL    Comment:        THERAPEUTIC CONCENTRATIONS VARY SIGNIFICANTLY. A RANGE OF 10-30 ug/mL MAY BE AN EFFECTIVE CONCENTRATION FOR MANY PATIENTS. HOWEVER, SOME ARE BEST TREATED AT CONCENTRATIONS OUTSIDE THIS RANGE. ACETAMINOPHEN CONCENTRATIONS >150 ug/mL AT 4 HOURS AFTER INGESTION AND >50 ug/mL AT 12 HOURS AFTER INGESTION ARE OFTEN ASSOCIATED WITH TOXIC REACTIONS.   Ethanol     Status: Abnormal   Collection Time: 07/30/16  6:25 PM  Result Value Ref Range   Alcohol, Ethyl (B) 110 (H) <5 mg/dL    Comment:        LOWEST DETECTABLE LIMIT FOR SERUM ALCOHOL IS 5 mg/dL FOR MEDICAL PURPOSES ONLY   Hepatic function panel     Status: Abnormal   Collection Time: 07/30/16  6:25 PM  Result Value Ref Range   Total Protein 7.1 6.5 - 8.1 g/dL    Albumin 4.3 3.5 - 5.0 g/dL   AST 18 15 - 41 U/L   ALT 12 (L) 14 - 54 U/L   Alkaline Phosphatase 66 38 - 126 U/L   Total Bilirubin 0.6 0.3 - 1.2 mg/dL   Bilirubin, Direct 0.1 0.1 - 0.5 mg/dL   Indirect Bilirubin 0.5 0.3 - 0.9 mg/dL  Blood gas, venous     Status: Abnormal   Collection Time: 07/30/16  6:26 PM  Result Value Ref Range   pH, Ven 7.369 7.250 - 7.430   pCO2, Ven 40.7 (L) 44.0 - 60.0 mmHg   pO2, Ven 41.5 32.0 - 45.0 mmHg   Bicarbonate 22.9 20.0 - 28.0 mmol/L   Acid-base deficit 1.7 0.0 - 2.0 mmol/L   O2 Saturation 73.1 %   Patient temperature 98.6    Collection site VEIN    Drawn by COLLECTED BY LABORATORY    Sample type VENOUS   I-Stat beta hCG blood, ED     Status: None   Collection Time: 07/30/16  6:36 PM  Result Value Ref Range   I-stat hCG, quantitative <5.0 <5 mIU/mL   Comment 3            Comment:   GEST. AGE      CONC.  (mIU/mL)   <=1 WEEK        5 - 50     2 WEEKS       50 - 500     3 WEEKS       100 - 10,000  4 WEEKS     1,000 - 30,000        FEMALE AND NON-PREGNANT FEMALE:     LESS THAN 5 mIU/mL   I-stat Chem 8, ED     Status: Abnormal   Collection Time: 07/30/16  6:39 PM  Result Value Ref Range   Sodium 143 135 - 145 mmol/L   Potassium 3.2 (L) 3.5 - 5.1 mmol/L   Chloride 105 101 - 111 mmol/L   BUN 10 6 - 20 mg/dL   Creatinine, Ser 1.61 0.44 - 1.00 mg/dL   Glucose, Bld 68 65 - 99 mg/dL   Calcium, Ion 0.96 0.45 - 1.40 mmol/L   TCO2 22 0 - 100 mmol/L   Hemoglobin 15.3 (H) 12.0 - 15.0 g/dL   HCT 40.9 81.1 - 91.4 %    Current Facility-Administered Medications  Medication Dose Route Frequency Provider Last Rate Last Dose  . acetaminophen (TYLENOL) tablet 650 mg  650 mg Oral Q4H PRN Mancel Bale, MD   650 mg at 07/31/16 0510  . aspirin EC tablet 81 mg  81 mg Oral Daily Mancel Bale, MD   81 mg at 07/31/16 0949  . brimonidine (ALPHAGAN) 0.2 % ophthalmic solution 1 drop  1 drop Both Eyes BID Mancel Bale, MD   1 drop at 07/31/16 0950  .  hydrochlorothiazide (MICROZIDE) capsule 12.5 mg  12.5 mg Oral Daily Mancel Bale, MD   12.5 mg at 07/31/16 0949  . ibuprofen (ADVIL,MOTRIN) tablet 800 mg  800 mg Oral Q6H PRN Charm Rings, NP      . latanoprost (XALATAN) 0.005 % ophthalmic solution 2 drop  2 drop Both Eyes QHS Mancel Bale, MD   2 drop at 07/30/16 2216  . linaclotide (LINZESS) capsule 145 mcg  145 mcg Oral QAC breakfast Mancel Bale, MD   145 mcg at 07/31/16 0751  . lisinopril (PRINIVIL,ZESTRIL) tablet 20 mg  20 mg Oral Daily Mancel Bale, MD   20 mg at 07/31/16 0949  . nicotine (NICODERM CQ - dosed in mg/24 hours) patch 21 mg  21 mg Transdermal Daily PRN Mancel Bale, MD      . ondansetron [facility]) tablet 4 mg  4 mg Oral Q8H PRN Mancel Bale, MD      . pantoprazole (PROTONIX) EC tablet 40 mg  40 mg Oral Daily Mancel Bale, MD   40 mg at 07/31/16 0949  . timolol (TIMOPTIC) 0.5 % ophthalmic solution 1 drop  1 drop Both Eyes BID Mancel Bale, MD   1 drop at 07/31/16 0950  . traZODone (DESYREL) tablet 100 mg  100 mg Oral QHS Mancel Bale, MD   100 mg at 07/30/16 2231  . umeclidinium bromide (INCRUSE ELLIPTA) 62.5 MCG/INH 1 puff  1 puff Inhalation Daily Mancel Bale, MD   1 puff at 07/31/16 0950  . venlafaxine XR (EFFEXOR-XR) 24 hr capsule 75 mg  75 mg Oral Q breakfast Mancel Bale, MD   75 mg at 07/31/16 7829   Current Outpatient Prescriptions  Medication Sig Dispense Refill  . albuterol (PROVENTIL HFA;VENTOLIN HFA) 108 (90 BASE) MCG/ACT inhaler Inhale 2 puffs into the lungs every 6 (six) hours as needed. For wheezing    . aspirin 81 MG EC tablet Take 81 mg by mouth daily. Swallow whole.    . brimonidine-timolol (COMBIGAN) 0.2-0.5 % ophthalmic solution Place 1 drop into both eyes every 12 (twelve) hours.     Marland Kitchen ibuprofen (ADVIL,MOTRIN) 800 MG tablet Take 800 mg by mouth 2 (two) times daily as  needed for moderate pain.     Marland Kitchen latanoprost (XALATAN) 0.005 % ophthalmic solution Place 2 drops into both eyes at bedtime.    Marland Kitchen  linaclotide (LINZESS) 145 MCG CAPS capsule Take 145 mcg by mouth daily before breakfast.    . lisinopril-hydrochlorothiazide (PRINZIDE,ZESTORETIC) 20-12.5 MG per tablet Take 1 tablet by mouth daily.    . metoprolol succinate (TOPROL-XL) 50 MG 24 hr tablet Take 25 mg by mouth daily. Take with or immediately following a meal.    . Omega-3 Fatty Acids (FISH OIL PO) Take 1 capsule by mouth daily.    Marland Kitchen omeprazole (PRILOSEC) 20 MG capsule Take 40 mg by mouth every morning.     Marland Kitchen tiZANidine (ZANAFLEX) 4 MG capsule Take 1 capsule (4 mg total) by mouth 3 (three) times daily. (Patient taking differently: Take 4 mg by mouth 3 (three) times daily as needed for muscle spasms. ) 30 capsule 0  . traZODone (DESYREL) 100 MG tablet Take 1 tablet (100 mg total) by mouth at bedtime. (Patient taking differently: Take 150 mg by mouth at bedtime. ) 30 tablet 0  . umeclidinium bromide (INCRUSE ELLIPTA) 62.5 MCG/INH AEPB Inhale 1 puff into the lungs daily.    Marland Kitchen venlafaxine XR (EFFEXOR-XR) 75 MG 24 hr capsule Take 75 mg by mouth daily with breakfast.    . Vitamin D, Ergocalciferol, (DRISDOL) 50000 units CAPS capsule Take 50,000 Units by mouth once a week.  5  . zolpidem (AMBIEN) 10 MG tablet Take 1 tablet (10 mg total) by mouth at bedtime as needed for sleep. (Patient not taking: Reported on 07/31/2016) 30 tablet 0    Musculoskeletal: Strength & Muscle Tone: within normal limits Gait & Station: normal Patient leans: N/A  Psychiatric Specialty Exam: Physical Exam  Constitutional: She appears well-developed and well-nourished.  HENT:  Head: Normocephalic.  Respiratory: Effort normal.  Musculoskeletal: Normal range of motion.  Neurological: She is alert.  Skin: Skin is warm and dry.  Psychiatric: She has a normal mood and affect. Her speech is normal and behavior is normal. Judgment and thought content normal. Cognition and memory are normal.    Review of Systems  Constitutional: Negative.   HENT: Negative.    Eyes: Negative.   Respiratory: Negative.   Cardiovascular: Negative.   Gastrointestinal: Negative.   Genitourinary: Negative.   Musculoskeletal: Positive for back pain.  Skin: Negative.   Neurological: Negative.   Endo/Heme/Allergies: Negative.   Psychiatric/Behavioral: Positive for substance abuse.    Blood pressure 159/89, pulse 73, temperature 97.9 F (36.6 C), temperature source Oral, resp. rate 16, height 5\' 3"  (1.6 m), weight 65.3 kg (144 lb), SpO2 98 %.Body mass index is 25.51 kg/m.  General Appearance: Casual  Eye Contact:  Good  Speech:  Normal Rate  Volume:  Normal  Mood:  Depressed, mild  Affect:  Congruent  Thought Process:  Coherent and Descriptions of Associations: Intact  Orientation:  Full (Time, Place, and Person)  Thought Content:  Logical  Suicidal Thoughts:  No  Homicidal Thoughts:  No  Memory:  Immediate;   Good Recent;   Good Remote;   Good  Judgement:  Fair  Insight:  Fair  Psychomotor Activity:  Normal  Concentration:  Concentration: Good and Attention Span: Good  Recall:  Good  Fund of Knowledge:  Good  Language:  Good  Akathisia:  No  Handed:  Right  AIMS (if indicated):     Assets:  Housing Leisure Time Physical Health Resilience  ADL's:  Intact  Cognition:  WNL  Sleep:        Treatment Plan Summary: Daily contact with patient to assess and evaluate symptoms and progress in treatment, Medication management and Plan bipolar disorder, most recent episode, depressed, mild:  -Crisis stabilization -Medication management:  Continued medical medications along with Effexor 75 mg daily for depression and Trazodone 100 mg at bedtime for sleep. -Individual counseling  Disposition: No evidence of imminent risk to self or others at present.    Nanine Means, NP 07/31/2016 10:36 AM   Patient seen face-to-face for psychiatric evaluation, chart reviewed and case discussed with the physician extender and developed treatment plan. Reviewed the  information documented and agree with the treatment plan. Thedore Mins, MD

## 2016-07-31 NOTE — ED Notes (Signed)
No respiratory or acute distress noted hospital sitter at bedside resting in bed with eyes closed.

## 2016-07-31 NOTE — ED Notes (Signed)
No respiratory or acute distress noted resting in bed with eyes closed no hospital sitter with pt Lyla Sonarrie RN aware.

## 2016-07-31 NOTE — ED Notes (Signed)
Patient arrived to [facility]32 wigs and eyeglasses on, one bag of personal belonging locked in locker 32, SI sitter at bedside

## 2016-07-31 NOTE — ED Notes (Signed)
Pt refused tylenol for back pain states it will not work.

## 2016-07-31 NOTE — [facility] Suicide Risk Assessment (Signed)
Suicide Risk Assessment  Discharge Assessment   [facility]   Principal Problem: Bipolar disorder with depression [facility](HCC) Discharge Diagnoses:  Patient Active Problem List   Diagnosis Date Noted  . Alcohol abuse [F10.10] 07/31/2016    Priority: High  . Bipolar disorder with depression (HCC) [F31.30] 06/28/2016    Priority: High  . Chest pain [R07.9] 06/09/2014  . CAP (community acquired pneumonia) [J18.9] 06/09/2014  . COPD (chronic obstructive pulmonary disease) (HCC) [J44.9]   . Fibromyalgia [M79.7]   . Bipolar 1 disorder (HCC) [F31.9]   . GERD (gastroesophageal reflux disease) [K21.9]     Total Time spent with patient: 45 minutes  Musculoskeletal: Strength & Muscle Tone: within normal limits Gait & Station: normal Patient leans: N/A  Psychiatric Specialty Exam: Physical Exam  Constitutional: She appears well-developed and well-nourished.  HENT:  Head: Normocephalic.  Respiratory: Effort normal.  Musculoskeletal: Normal range of motion.  Neurological: She is alert.  Skin: Skin is warm and dry.  Psychiatric: She has a normal mood and affect. Her speech is normal and behavior is normal. Judgment and thought content normal. Cognition and memory are normal.    Review of Systems  Constitutional: Negative.   HENT: Negative.   Eyes: Negative.   Respiratory: Negative.   Cardiovascular: Negative.   Gastrointestinal: Negative.   Genitourinary: Negative.   Musculoskeletal: Positive for back pain.  Skin: Negative.   Neurological: Negative.   Endo/Heme/Allergies: Negative.   Psychiatric/Behavioral: Positive for substance abuse.    Blood pressure 159/89, pulse 73, temperature 97.9 F (36.6 C), temperature source Oral, resp. rate 16, height 5\' 3"  (1.6 m), weight 65.3 kg (144 lb), SpO2 98 %.Body mass index is 25.51 kg/m.  General Appearance: Casual  Eye Contact:  Good  Speech:  Normal Rate  Volume:  Normal  Mood:  Depressed, mild  Affect:   Congruent  Thought Process:  Coherent and Descriptions of Associations: Intact  Orientation:  Full (Time, Place, and Person)  Thought Content:  Logical  Suicidal Thoughts:  No  Homicidal Thoughts:  No  Memory:  Immediate;   Good Recent;   Good Remote;   Good  Judgement:  Fair  Insight:  Fair  Psychomotor Activity:  Normal  Concentration:  Concentration: Good and Attention Span: Good  Recall:  Good  Fund of Knowledge:  Good  Language:  Good  Akathisia:  No  Handed:  Right  AIMS (if indicated):     Assets:  Housing Leisure Time Physical Health Resilience  ADL's:  Intact  Cognition:  WNL  Sleep:      Mental Status Per Nursing Assessment::   On Admission:   alcohol abuse with suicidal ideations  Demographic Factors:  Living alone  Loss Factors: NA  Historical Factors: NA  Risk Reduction Factors:   Sense of responsibility to family and Positive therapeutic relationship  Continued Clinical Symptoms:  Depression, mild  Cognitive Features That Contribute To Risk:  None    Suicide Risk:  Minimal: No identifiable suicidal ideation.  Patients presenting with no risk factors but with morbid ruminations; may be classified as minimal risk based on the severity of the depressive symptoms    Plan Of Care/Follow-up recommendations:  Activity:  as tolerated Diet:  heart healthy diet  Herley Bernardini, NP 07/31/2016, 10:43 AM

## 2016-07-31 NOTE — ED Notes (Signed)
No respiratory or acute distress noted resting in bed with eyes closed no reaction to medication noted hospital sitter in room with pt.

## 2016-08-05 ENCOUNTER — Encounter (HOSPITAL_COMMUNITY): Payer: Self-pay

## 2016-08-05 ENCOUNTER — Emergency Department (HOSPITAL_COMMUNITY): Payer: Medicare Other

## 2016-08-05 ENCOUNTER — Emergency Department (HOSPITAL_COMMUNITY)
Admission: EM | Admit: 2016-08-05 | Discharge: 2016-08-06 | Disposition: A | Discharge status: Home / Self Care | Payer: Medicare Other | Attending: Emergency Medicine | Admitting: Emergency Medicine

## 2016-08-05 DIAGNOSIS — R002 Palpitations: Secondary | ICD-10-CM | POA: Diagnosis not present

## 2016-08-05 DIAGNOSIS — Z7982 Long term (current) use of aspirin: Secondary | ICD-10-CM | POA: Insufficient documentation

## 2016-08-05 DIAGNOSIS — M5412 Radiculopathy, cervical region: Secondary | ICD-10-CM | POA: Insufficient documentation

## 2016-08-05 DIAGNOSIS — I10 Essential (primary) hypertension: Secondary | ICD-10-CM | POA: Insufficient documentation

## 2016-08-05 DIAGNOSIS — J449 Chronic obstructive pulmonary disease, unspecified: Secondary | ICD-10-CM | POA: Insufficient documentation

## 2016-08-05 DIAGNOSIS — Z79899 Other long term (current) drug therapy: Secondary | ICD-10-CM | POA: Diagnosis not present

## 2016-08-05 DIAGNOSIS — M546 Pain in thoracic spine: Secondary | ICD-10-CM | POA: Insufficient documentation

## 2016-08-05 DIAGNOSIS — F17210 Nicotine dependence, cigarettes, uncomplicated: Secondary | ICD-10-CM | POA: Diagnosis not present

## 2016-08-05 DIAGNOSIS — R52 Pain, unspecified: Secondary | ICD-10-CM

## 2016-08-05 DIAGNOSIS — M542 Cervicalgia: Secondary | ICD-10-CM | POA: Diagnosis present

## 2016-08-05 HISTORY — DX: Peripheral vascular disease, unspecified: I73.9

## 2016-08-05 LAB — COMPREHENSIVE METABOLIC PANEL
ALBUMIN: 3.9 g/dL (ref 3.5–5.0)
ALK PHOS: 53 U/L (ref 38–126)
ALT: 10 U/L — AB (ref 14–54)
ANION GAP: 6 (ref 5–15)
AST: 19 U/L (ref 15–41)
BUN: 10 mg/dL (ref 6–20)
CALCIUM: 8.9 mg/dL (ref 8.9–10.3)
CHLORIDE: 109 mmol/L (ref 101–111)
CO2: 22 mmol/L (ref 22–32)
CREATININE: 0.62 mg/dL (ref 0.44–1.00)
GFR calc Af Amer: >60 mL/min (ref >60)
GFR calc non Af Amer: >60 mL/min (ref >60)
GLUCOSE: 88 mg/dL (ref 65–99)
Potassium: 3.3 mmol/L — ABNORMAL LOW (ref 3.5–5.1)
SODIUM: 137 mmol/L (ref 135–145)
Total Bilirubin: 0.2 mg/dL — ABNORMAL LOW (ref 0.3–1.2)
Total Protein: 6.2 g/dL — ABNORMAL LOW (ref 6.5–8.1)

## 2016-08-05 LAB — CBC WITH DIFFERENTIAL/PLATELET
Basophils Absolute: 0.1 10*3/uL (ref 0.0–0.1)
Basophils Relative: 1 %
EOS ABS: 0.4 10*3/uL (ref 0.0–0.7)
Eosinophils Relative: 6 %
HCT: 37.6 % (ref 36.0–46.0)
Hemoglobin: 12.9 g/dL (ref 12.0–15.0)
LYMPHS ABS: 3 10*3/uL (ref 0.7–4.0)
LYMPHS PCT: 48 %
MCH: 31.8 pg (ref 26.0–34.0)
MCHC: 34.3 g/dL (ref 30.0–36.0)
MCV: 92.6 fL (ref 78.0–100.0)
MONOS PCT: 9 %
Monocytes Absolute: 0.6 10*3/uL (ref 0.1–1.0)
NEUTROS PCT: 36 %
Neutro Abs: 2.3 10*3/uL (ref 1.7–7.7)
Platelets: 298 10*3/uL (ref 150–400)
RBC: 4.06 MIL/uL (ref 3.87–5.11)
RDW: 14.6 % (ref 11.5–15.5)
WBC: 6.2 10*3/uL (ref 4.0–10.5)

## 2016-08-05 LAB — RAPID URINE DRUG SCREEN, HOSP PERFORMED
AMPHETAMINES: NOT DETECTED
BARBITURATES: NOT DETECTED
Benzodiazepines: NOT DETECTED
Cocaine: NOT DETECTED
OPIATES: NOT DETECTED
TETRAHYDROCANNABINOL: NOT DETECTED

## 2016-08-05 LAB — ETHANOL: Alcohol, Ethyl (B): <5 mg/dL (ref <5)

## 2016-08-05 MED ORDER — POTASSIUM CHLORIDE CRYS ER 20 MEQ PO TBCR
20.0000 meq | EXTENDED_RELEASE_TABLET | Freq: Once | ORAL | Status: AC
  Administered 2016-08-05: 20 meq via ORAL
  Filled 2016-08-05: qty 1

## 2016-08-05 MED ORDER — OXYCODONE-ACETAMINOPHEN 5-325 MG PO TABS: 1.0000 | ORAL_TABLET | Freq: Three times a day (TID) | ORAL | 0 refills | Status: DC | PRN

## 2016-08-05 MED ORDER — PREDNISONE 20 MG PO TABS
60.0000 mg | ORAL_TABLET | Freq: Once | ORAL | Status: AC
  Administered 2016-08-05: 60 mg via ORAL
  Filled 2016-08-05: qty 3

## 2016-08-05 MED ORDER — PREDNISONE 20 MG PO TABS: 40.0000 mg | ORAL_TABLET | Freq: Every day | ORAL | 0 refills | Status: DC

## 2016-08-05 MED ORDER — ONDANSETRON HCL 4 MG/2ML IJ SOLN: 4.0000 mg | Freq: Once | INTRAMUSCULAR | Status: DC

## 2016-08-05 MED ORDER — METHOCARBAMOL 1000 MG/10ML IJ SOLN
500.0000 mg | Freq: Once | INTRAMUSCULAR | Status: AC
  Administered 2016-08-05: 500 mg via INTRAVENOUS
  Filled 2016-08-05 (×2): qty 5

## 2016-08-05 MED ORDER — MORPHINE SULFATE (PF) 4 MG/ML IV SOLN
4.0000 mg | Freq: Once | INTRAVENOUS | Status: AC
  Administered 2016-08-05: 4 mg via INTRAVENOUS
  Filled 2016-08-05: qty 1

## 2016-08-05 MED ORDER — CYCLOBENZAPRINE HCL 10 MG PO TABS: 10.0000 mg | ORAL_TABLET | Freq: Two times a day (BID) | ORAL | 0 refills | Status: DC | PRN

## 2016-08-05 NOTE — ED Notes (Signed)
Back from xray

## 2016-08-05 NOTE — ED Provider Notes (Signed)
[facility]-EMERGENCY DEPT Provider Note   CSN: [id number] Arrival date & time: 08/05/16  1914     History   Chief Complaint Chief Complaint  Patient presents with  . Back Pain  . Neck Pain  . Arm Pain  . Osteoarthritis    HPI Cassie Harrell is a 65 y.o. female.  HPI   Patient is a 65 year old female with multiple pain complaints including neck pain, arm pain and back pain with a history of osteoarthritis and spondylolysis. She states that she has run out of her home pain medication. She also has a history of fibromyalgia and believes she is in a flare.  She has hx of bipolar disorder and polysubstance abuse, was recently seen in the ED with similar acute on chronic pain that made her "not think straight" because of the pain and she expressed a desire to have "it all be over."  She currently denies any similar feelings, denies SI, HI, AVH or illegal substance abuse.  She had her psychiatric medications changed during this same time and the medications did not work as well as her past medications.  She comes to the ER today because of continued 3 weeks of severe pain, and her PCP is "unwilling" to prescribe pain medications because of her recent ED/Psych visit.  She has taken tylenol w/o relief.  She takes zanaflex and it usually is helpful with minor pain flares, but no improvement over the past three weeks.  Pain is severe, 10/10, achy, throbbing and intermittently shooting and stabbing radiating from neck into arms, and low back into legs, no alleviating factors, exacerbated by movement and palpation.  She denies numbness, tingling, weakness, fever, urinary retention, incontinence of stool or urine, saddle anesthesia.   She also has palpitations, w/o any associated sx, including no CP, SOB, near syncope, LE edema, orthopnea, PND.   Past Medical History:  Diagnosis Date  . Bipolar 1 disorder (HCC)   . Cocaine abuse   . COPD (chronic obstructive pulmonary disease) (HCC)   .  Dissociative disorder   . GERD (gastroesophageal reflux disease)   . Glaucoma   . Hypertension   . Neuropathy of foot    bilateral  . Osteoarthritis   . PTSD (post-traumatic stress disorder)   . PVD (peripheral vascular disease) (HCC) 2017  . Sleep apnea     Patient Active Problem List   Diagnosis Date Noted  . Alcohol abuse 07/31/2016  . Bipolar disorder with depression (HCC) 06/28/2016  . Chest pain 06/09/2014  . CAP (community acquired pneumonia) 06/09/2014  . COPD (chronic obstructive pulmonary disease) (HCC)   . Fibromyalgia   . Bipolar 1 disorder (HCC)   . GERD (gastroesophageal reflux disease)     Past Surgical History:  Procedure Laterality Date  . ABDOMINAL HYSTERECTOMY    . APPENDECTOMY    . CESAREAN SECTION    . EYE SURGERY    . FINGER SURGERY    . REVISION OF SCAR TISSUE RECTUS MUSCLE    . UVULOPALATOPHARYNGOPLASTY      OB History    Gravida Para Term Preterm AB Living   1             SAB TAB Ectopic Multiple Live Births                   Home Medications    Prior to Admission medications   Medication Sig Start Date End Date Taking? Authorizing Provider  albuterol (PROVENTIL HFA;VENTOLIN HFA) 108 (90 BASE) MCG/ACT inhaler  Inhale 2 puffs into the lungs every 6 (six) hours as needed. For wheezing    Historical Provider, MD  aspirin 81 MG EC tablet Take 81 mg by mouth daily. Swallow whole.    Historical Provider, MD  brimonidine-timolol (COMBIGAN) 0.2-0.5 % ophthalmic solution Place 1 drop into both eyes every 12 (twelve) hours.     Historical Provider, MD  cyclobenzaprine (FLEXERIL) 10 MG tablet Take 1 tablet (10 mg total) by mouth 2 (two) times daily as needed for muscle spasms. 08/05/16   Danelle Berry, PA-C  ibuprofen (ADVIL,MOTRIN) 800 MG tablet Take 800 mg by mouth 2 (two) times daily as needed for moderate pain.  06/22/16   Historical Provider, MD  latanoprost (XALATAN) 0.005 % ophthalmic solution Place 2 drops into both eyes at bedtime.    Historical  Provider, MD  linaclotide (LINZESS) 145 MCG CAPS capsule Take 145 mcg by mouth daily before breakfast.    Historical Provider, MD  lisinopril-hydrochlorothiazide (PRINZIDE,ZESTORETIC) 20-12.5 MG per tablet Take 1 tablet by mouth daily.    Historical Provider, MD  metoprolol succinate (TOPROL-XL) 50 MG 24 hr tablet Take 25 mg by mouth daily. Take with or immediately following a meal.    Historical Provider, MD  Omega-3 Fatty Acids (FISH OIL PO) Take 1 capsule by mouth daily.    Historical Provider, MD  omeprazole (PRILOSEC) 20 MG capsule Take 40 mg by mouth every morning.     Historical Provider, MD  oxyCODONE-acetaminophen (PERCOCET/ROXICET) 5-325 MG tablet Take 1 tablet by mouth every 8 (eight) hours as needed for severe pain. 08/05/16   Danelle Berry, PA-C  predniSONE (DELTASONE) 20 MG tablet Take 2 tablets (40 mg total) by mouth daily. Take 40 mg by mouth daily for 3 days, then 20mg  by mouth daily for 3 days, then 10mg  daily for 3 days 08/05/16   Danelle Berry, PA-C  tiZANidine (ZANAFLEX) 4 MG capsule Take 1 capsule (4 mg total) by mouth 3 (three) times daily. Patient taking differently: Take 4 mg by mouth 3 (three) times daily as needed for muscle spasms.  02/23/15   Arby Barrette, MD  traZODone (DESYREL) 100 MG tablet Take 1 tablet (100 mg total) by mouth at bedtime. Patient taking differently: Take 150 mg by mouth at bedtime.  06/28/16   Charm Rings, NP  umeclidinium bromide (INCRUSE ELLIPTA) 62.5 MCG/INH AEPB Inhale 1 puff into the lungs daily.    Historical Provider, MD  venlafaxine XR (EFFEXOR-XR) 75 MG 24 hr capsule Take 75 mg by mouth daily with breakfast.    Historical Provider, MD  Vitamin D, Ergocalciferol, (DRISDOL) 50000 units CAPS capsule Take 50,000 Units by mouth once a week. 06/16/16   Historical Provider, MD  zolpidem (AMBIEN) 10 MG tablet Take 1 tablet (10 mg total) by mouth at bedtime as needed for sleep. Patient not taking: Reported on 07/31/2016 06/10/14 07/10/14  Calvert Cantor, MD      Family History Family History  Problem Relation Age of Onset  . Other Mother     natural  . Heart attack Father   . Other Sister     kidney transplant  . Alcoholism Brother   . Goiter Sister   . Diabetes Brother   . Diabetes Sister     Social History Social History  Substance Use Topics  . Smoking status: Current Every Day Smoker    Packs/day: 0.50    Years: 20.00    Types: Cigarettes  . Smokeless tobacco: Never Used  . Alcohol use Yes  Comment: 1-2 cans of beer weekly     Allergies   Codeine; Flagyl [metronidazole]; Imipramine; Meloxicam; Metoprolol tartrate; and Vistaril [hydroxyzine hcl]   Review of Systems Review of Systems  All other systems reviewed and are negative.    Physical Exam Updated Vital Signs BP 172/95   Pulse 73   Temp 98.3 F (36.8 C) (Oral)   Resp 17   Ht 5\' 3"  (1.6 m)   Wt 65.6 kg   SpO2 100%   BMI 25.61 kg/m   Physical Exam  Constitutional: She is oriented to person, place, and time. She appears well-developed and well-nourished. No distress.  HENT:  Head: Normocephalic and atraumatic.  Right Ear: External ear normal.  Left Ear: External ear normal.  Nose: Nose normal.  Mouth/Throat: Oropharynx is clear and moist. No oropharyngeal exudate.  Eyes: Conjunctivae and EOM are normal. Pupils are equal, round, and reactive to light. Right eye exhibits no discharge. Left eye exhibits no discharge. No scleral icterus.  Neck: Normal range of motion. Neck supple. No JVD present.  Cardiovascular: Normal rate, regular rhythm, normal heart sounds and intact distal pulses.  Exam reveals no gallop and no friction rub.   No murmur heard. Pulmonary/Chest: Effort normal. No stridor. No respiratory distress. She has wheezes. She has no rales. She exhibits no tenderness.  Abdominal: Soft. Bowel sounds are normal. She exhibits no distension and no mass. There is no tenderness. There is no guarding.  Musculoskeletal: Normal range of motion. She  exhibits tenderness. She exhibits no edema.  No midline tenderness, no step off Paraspinal cervical muscle ttp No joint edema, erythema or effusion  Neurological: She is alert and oriented to person, place, and time. She exhibits normal muscle tone. Coordination normal.  Normal sensation to light touch in all extremities Normal strength in all extremities including symettrical grip strength, bilateral LE dorsiflexion and plantarflexion Normal gait  Skin: Skin is warm and dry. Capillary refill takes less than 2 seconds. No rash noted. She is not diaphoretic. No erythema. No pallor.  Psychiatric: She has a normal mood and affect. Her behavior is normal. Judgment and thought content normal.  Nursing note and vitals reviewed.    ED Treatments / Results  Labs (all labs ordered are listed, but only abnormal results are displayed) Labs Reviewed  COMPREHENSIVE METABOLIC PANEL - Abnormal; Notable for the following:       Result Value   Potassium 3.3 (*)    Total Protein 6.2 (*)    ALT 10 (*)    Total Bilirubin 0.2 (*)    All other components within normal limits  ETHANOL  CBC WITH DIFFERENTIAL/PLATELET  RAPID URINE DRUG SCREEN, HOSP PERFORMED    EKG  EKG Interpretation  Date/Time:  Saturday August 05 2016 21:02:59 EDT Ventricular Rate:  89 PR Interval:    QRS Duration: 116 QT Interval:  414 QTC Calculation: 504 R Axis:   -22 Text Interpretation:  Sinus rhythm LVH with secondary repolarization abnormality Prolonged QT interval Confirmed by RAY MD, Duwayne Heck [phone]) on 08/07/2016 9:03:32 AM       Radiology Dg Cervical Spine Complete  Result Date: 08/05/2016 CLINICAL DATA:  Pt states she has hx cervical spondylosis and arthritis; pt states she has been in pain for 3 weeks; pt states she was hurting so back a last week that she tried to kill herself because she could not find pain relief. EXAM: CERVICAL SPINE - COMPLETE 4+ VIEW COMPARISON:  04/12/2013 FINDINGS: There is loss of  cervical lordosis.  This may be secondary to splinting, soft tissue injury, or positioning. Degenerative changes are identified at C4-5 and C5-6. There is 3 mm retrolisthesis of C4 on C5. There is 3 mm retrolisthesis of C5 on C6. The appearance is similar to the prior study from 2014. There is loss of cervical lordosis. This may be secondary to splinting, soft tissue injury, or positioning. No acute fracture identified.  Lung apices are clear. IMPRESSION: Midcervical degenerative changes. Grade 1 retrolisthesis C4 on C5 and C5 on C6. Loss of lordosis. Electronically Signed   By: Norva Pavlov M.D.   On: 08/05/2016 23:09   Dg Thoracic Spine 2 View  Result Date: 08/05/2016 CLINICAL DATA:  Pt states she has hx cervical spondylosis and arthritis; pt states she has been in pain for 3 weeks; pt states she was hurting so back a last week that she tried to kill herself because she could not find pain relief. Pain down the back into both shoulders. Left more than right. EXAM: THORACIC SPINE 2 VIEWS COMPARISON:  06/27/2016, CT of the chest 12/28/2014, 06/09/2014 FINDINGS: Mild degenerative changes are identified in the mid cervical spine. Thoracic spine shows normal alignment. No acute fracture or subluxation. No suspicious lytic or blastic lesions are identified. Stable very mild anterior wedge morphology of C6 on C7 over multiple prior studies and likely physiologic. IMPRESSION: No evidence for acute No evidence for acute  abnormality. Electronically Signed   By: Norva Pavlov M.D.   On: 08/05/2016 23:06    Procedures Procedures (including critical care time)  Medications Ordered in ED Medications  morphine 4 MG/ML injection 4 mg (4 mg Intravenous Given 08/05/16 2129)  methocarbamol (ROBAXIN) 500 mg in dextrose 5 % 50 mL IVPB (0 mg Intravenous Stopped 08/05/16 2324)  predniSONE (DELTASONE) tablet 60 mg (60 mg Oral Given 08/05/16 2356)  potassium chloride SA (K-DUR,KLOR-CON) CR tablet 20 mEq (20 mEq Oral  Given 08/05/16 2356)     Initial Impression / Assessment and Plan / ED Course  I have reviewed the triage vital signs and the nursing notes.  Pertinent labs & imaging results that were available during my care of the patient were reviewed by me and considered in my medical decision making (see chart for details).  Clinical Course   Pt with acute on chronic neck and back pain, hx of osteopenia/osteoarthritis, spondylosis and fibromyalgia.  Neurovascularly intact, afebrile, no concerning Hx of physical exam findings.  Pt looked up in controlled substance database, UDS negative.  Will treat MSK pain with steroid burst, muscle relaxers and pain medication.  Given resources for pain management.  Xrays negative for acute pathology, pertinent for DDD. Pt had no suicidal ideations, she denies SI or suicide attempt recently, just felt hopeless with severe pain.  Currently no SI, HI or AVH, pt maintains good eye contract, provides clear history.  Agrees to tx plan.  Encouraged to return to PCP for other non-narcotic pain tx.  Referred to ortho and given pain management resources.   She complained of palpitations, EKG normal, cardiac monitoring reviewed and NSR, VSS WNL.  Pt discharged home in good condition.    Final Clinical Impressions(s) / ED Diagnoses   Final diagnoses:  Cervical radiculopathy  Midline thoracic back pain, unspecified chronicity  Palpitations    New Prescriptions Discharge Medication List as of 08/05/2016 11:54 PM    START taking these medications   Details  cyclobenzaprine (FLEXERIL) 10 MG tablet Take 1 tablet (10 mg total) by mouth 2 (two) times daily  as needed for muscle spasms., Starting Sat 08/05/2016, Print    oxyCODONE-acetaminophen (PERCOCET/ROXICET) 5-325 MG tablet Take 1 tablet by mouth every 8 (eight) hours as needed for severe pain., Starting Sat 08/05/2016, Print    predniSONE (DELTASONE) 20 MG tablet Take 2 tablets (40 mg total) by mouth daily. Take 40 mg by  mouth daily for 3 days, then 20mg  by mouth daily for 3 days, then 10mg  daily for 3 days, Starting Sat 08/05/2016, Print         Danelle BerryLeisa Evelise Reine, PA-C 08/15/16 1818    Alvira MondayErin Schlossman, MD 08/16/16 2255

## 2016-08-05 NOTE — ED Triage Notes (Signed)
Pt states she has hx cervical spondylosis, sciatica and arthritis; pt states she has been hurting for 3 weeks; pt states she was hurting so back a last week that she tried to kill herself because she could not find pain relieve; pt states she has seen PCP and PCP would not give her stronger pain meds; pt has been taking 800 mg Ib profen with no relive; Pt states she ran out of hydrocodone last week; Pt denies SI tonight; Pt &aox 4 on arrival; pt able to ambulate independently.

## 2016-08-05 NOTE — Discharge Instructions (Signed)
You can follow up with [facility] to review medications.  The following is other resources:  RESOURCE GUIDE  Chronic Pain Problems: Contact [facility]  [phone] Patients need to be referred by their primary care doctor.  Insufficient Money for Medicine: Contact United Way:  call "211."   No Primary Care Doctor: Call Health Connect  [phone] - can help you locate a primary care doctor that  accepts your insurance, provides certain services, etc. Physician Referral Service- [phone]  Agencies that provide inexpensive medical care: [facility]  [phone] [facility]  [phone] [facility]  [phone] [facility]  [phone] Planned Parenthood  [phone] [facility]  [phone]  Medicaid-accepting [facility] Providers: [facility]- [address]  [phone], Mon-Fri 9am-7pm, Sat 9am-1pm [facility] [address] [place], Suite [place]  [phone] [facility]- [address] [place]  [phone] [facility]- [address]  [phone] Renaye Rakers- [address] [place], [address], [phone]  Only accepts [place] patients after they have their name  applied to their card  Self Pay (no insurance) in [facility]: Sickle Cell Patients: Dr Willey Blade, [facility]  [address] [place], [phone] [facility] Urgent Care- [address] [place]  [phone]       [facility] [place]- [address]       -     [facility]- see information above (Speak to [employer] if you do not have insurance)       -  [facility]- [address],  [phone]       -  [facility]- [address], [phone]       -  Dr Julio Sicks-  [address], [place], [phone]       -  [facility] - [address], [phone]        -  [facility]- [address], [phone], also [address], [phone]       -    [facility]- [address] [place], [phone], 1st & 3rd Saturday        every month, 10am-1pm  [facility] [address] [place], [place] [address] [phone]  [facility] [address] [place], [place] [address] [phone]  1) Find a Doctor and Pay Out of Pocket Although you won't have to find out who is covered by your insurance plan, it is a good idea to ask around and get recommendations. You will then need to call the office and see if the doctor you have chosen will accept you as a new patient and what types of options they offer for patients who are self-pay. Some doctors offer discounts or will set up payment plans for their patients who do not have insurance, but you will need to ask so you aren't surprised when you get to your appointment.  2) Contact Your Local Health Department Not all health departments have doctors that can see patients for sick visits, but many do, so it is worth a call to see if yours does. If you don't know where your local health department is, you can check in your phone book. The CDC also has a  tool to help you locate your state's health department, and many state websites also have listings of all of their local health departments.  3) Find a Walk-in Clinic If your illness is not likely to be very severe or complicated, you may want to try a walk in clinic. These are popping up all over the country in pharmacies, drugstores, and shopping centers. They're usually staffed by nurse practitioners or physician assistants that have been trained to treat common illnesses and complaints. They're usually fairly quick and inexpensive. However, if you have serious medical issues or chronic medical problems, these are probably not your best option  STD Testing [facility] [place], STD Clinic, [address], [place], phone [phone] or [phone].  Monday - Friday, call for an appointment. [facility] [place], STD Clinic, [place], [place], phone [phone] or [phone].  Monday - Friday, call for an appointment.  Abuse/Neglect: [facility] Child Abuse Hotline [phone] [facility] Child Abuse Hotline 60959950067575116010 (After Hours)  Emergency Shelter:  [facility] [phone]) [phone]  Maternity Homes: Room at the [place] [facility] [phone] [facility] [phone]  MRSA Hotline #:   608-875-5163937-461-5178  Dental Assistance If unable to pay or uninsured, contact:  [facility] to become qualified for the adult dental clinic.  Patients with Medicaid: [facility] [phone], [phone] [address], [phone]  If unable to pay, or uninsured, contact [facility] [phone] in [place], [phone] in [facility]) to become qualified for the adult dental clinic  [facility] [address] [place], [place] [address] [phone] [email or web address]  Other ProofreaderLow-Cost Community Dental Services: [facility]- [address], [place], [place], [phone], [phone], Ext. [phone], 2nd and 4th Thursday of the month at 6:30am.  10 clients each day by appointment, can sometimes see walk-in patients if someone does not show for an appointment. [facility]- [address], [place], [place], [phone], [phone] [facility]- [address], [place], [place], [address], [phone] [facility]- [phone] [facility]- [phone] [facility] [phone]- [phone]    Behavioral Health Resources in the [facility]  Intensive Outpatient Programs: [facility]      [address] [place], [place] [phone] Both a day and evening program       [facility]     [address]        [place], [place] [address] [phone]         ADS: [facility] & Drug S[facility] [address] [place] [phone]  [facility] ACCESS LINE: [phone] or [phone] [address] [place], [place] [address] [email or web address]  Mobile Crisis Teams:                                        Therapeutic Alternatives         Mobile Crisis Care Unit [phone]             [facility] [address] [place] [phone]  Interventionist [facility] [address] [place] [phone]  Self-Help/Support Groups: Mental Health Assoc. of [employer] of support groups [phone] (call for more info)  Narcotics Anonymous (NA) Caring Services [address] [place] - 2 meetings at this location  Residential Treatment Programs:  [facility]      [address]        [place]       [phone]         [facility] [address] [place] [address]6578 [place], [place]  [address] [phone]  [facility]  [address] [place], [place] [address] [phone] Admissions: 8am-3pm M-F  [facility]     [address]        [place], [place] [address]       [phone]         [facility] [address] [place], [place] [phone]  [facility] [address] [place], [place] [phone]  [facility] - Intensive Outpatient      [address]     [place], [place]       [phone]         [facility]     [address] [place], [place] [phone] or [phone]  Residential Treatment Services (RTS)  [address] [place],  [place] [phone]  [facility] [address]                                               [address] [place] [phone]  [facility] Resources: [place] [phone]               General Therapy                                                Angie Fava, PhD        [address] [place], [place] [address]         [phone]   Insurance  [facility]   [address] [place], [place] [address] [phone]  [facility] [address] [place], [place] [address] [phone] Insurance/Medicaid/sponsorship through [facility] and Families                                              [address]                                        [place], [place] [address]    Therapy/tele-psych/case         [phone]          [facility] [address], [place]  [address]  Adolescent/group home/case management  [phone]                                           Creola Corn PhD       General therapy       Insurance   [phone]         Dr. Lolly Mustache Insurance [phone] M-F  [place] [facility]/Residential Medicaid, sponsorship [phone]

## 2016-08-20 ENCOUNTER — Emergency Department (HOSPITAL_COMMUNITY): Payer: Medicare Other

## 2016-08-20 ENCOUNTER — Encounter (HOSPITAL_COMMUNITY): Payer: Self-pay | Admitting: *Deleted

## 2016-08-20 ENCOUNTER — Emergency Department (HOSPITAL_COMMUNITY)
Admission: EM | Admit: 2016-08-20 | Discharge: 2016-08-20 | Disposition: A | Discharge status: Home / Self Care | Payer: Medicare Other | Attending: Emergency Medicine | Admitting: Emergency Medicine

## 2016-08-20 DIAGNOSIS — F17210 Nicotine dependence, cigarettes, uncomplicated: Secondary | ICD-10-CM | POA: Insufficient documentation

## 2016-08-20 DIAGNOSIS — R55 Syncope and collapse: Secondary | ICD-10-CM | POA: Diagnosis not present

## 2016-08-20 DIAGNOSIS — G8929 Other chronic pain: Secondary | ICD-10-CM | POA: Diagnosis not present

## 2016-08-20 DIAGNOSIS — Y92002 Bathroom of unspecified non-institutional (private) residence single-family (private) house as the place of occurrence of the external cause: Secondary | ICD-10-CM | POA: Insufficient documentation

## 2016-08-20 DIAGNOSIS — Y939 Activity, unspecified: Secondary | ICD-10-CM | POA: Insufficient documentation

## 2016-08-20 DIAGNOSIS — W228XXA Striking against or struck by other objects, initial encounter: Secondary | ICD-10-CM | POA: Diagnosis not present

## 2016-08-20 DIAGNOSIS — S20212A Contusion of left front wall of thorax, initial encounter: Secondary | ICD-10-CM | POA: Insufficient documentation

## 2016-08-20 DIAGNOSIS — M545 Low back pain: Secondary | ICD-10-CM | POA: Diagnosis not present

## 2016-08-20 DIAGNOSIS — Y999 Unspecified external cause status: Secondary | ICD-10-CM | POA: Insufficient documentation

## 2016-08-20 DIAGNOSIS — S299XXA Unspecified injury of thorax, initial encounter: Secondary | ICD-10-CM | POA: Diagnosis present

## 2016-08-20 DIAGNOSIS — Z7982 Long term (current) use of aspirin: Secondary | ICD-10-CM | POA: Insufficient documentation

## 2016-08-20 DIAGNOSIS — J449 Chronic obstructive pulmonary disease, unspecified: Secondary | ICD-10-CM | POA: Diagnosis not present

## 2016-08-20 DIAGNOSIS — I10 Essential (primary) hypertension: Secondary | ICD-10-CM | POA: Diagnosis not present

## 2016-08-20 LAB — CBC WITH DIFFERENTIAL/PLATELET
BASOS PCT: 1 %
Basophils Absolute: 0.1 10*3/uL (ref 0.0–0.1)
EOS ABS: 0.2 10*3/uL (ref 0.0–0.7)
Eosinophils Relative: 4 %
HCT: 36.9 % (ref 36.0–46.0)
HEMOGLOBIN: 12.5 g/dL (ref 12.0–15.0)
Lymphocytes Relative: 41 %
Lymphs Abs: 2.8 10*3/uL (ref 0.7–4.0)
MCH: 31.5 pg (ref 26.0–34.0)
MCHC: 33.9 g/dL (ref 30.0–36.0)
MCV: 92.9 fL (ref 78.0–100.0)
Monocytes Absolute: 0.6 10*3/uL (ref 0.1–1.0)
Monocytes Relative: 9 %
NEUTROS ABS: 3.1 10*3/uL (ref 1.7–7.7)
NEUTROS PCT: 45 %
Platelets: 306 10*3/uL (ref 150–400)
RBC: 3.97 MIL/uL (ref 3.87–5.11)
RDW: 14.7 % (ref 11.5–15.5)
WBC: 6.8 10*3/uL (ref 4.0–10.5)

## 2016-08-20 LAB — BASIC METABOLIC PANEL
ANION GAP: 9 (ref 5–15)
BUN: 6 mg/dL (ref 6–20)
CALCIUM: 9.2 mg/dL (ref 8.9–10.3)
CO2: 23 mmol/L (ref 22–32)
Chloride: 107 mmol/L (ref 101–111)
Creatinine, Ser: 0.65 mg/dL (ref 0.44–1.00)
Glucose, Bld: 83 mg/dL (ref 65–99)
POTASSIUM: 3.4 mmol/L — AB (ref 3.5–5.1)
Sodium: 139 mmol/L (ref 135–145)

## 2016-08-20 LAB — TROPONIN I: Troponin I: <0.03 ng/mL (ref <0.03)

## 2016-08-20 MED ORDER — OXYCODONE-ACETAMINOPHEN 7.5-325 MG PO TABS: 1.0000 | ORAL_TABLET | ORAL | 0 refills | Status: DC | PRN

## 2016-08-20 MED ORDER — OXYCODONE-ACETAMINOPHEN 5-325 MG PO TABS
1.0000 | ORAL_TABLET | Freq: Once | ORAL | Status: AC
  Administered 2016-08-20: 1 via ORAL
  Filled 2016-08-20: qty 1

## 2016-08-20 MED ORDER — IBUPROFEN 800 MG PO TABS: 800.0000 mg | ORAL_TABLET | Freq: Three times a day (TID) | ORAL | 0 refills | Status: DC

## 2016-08-20 MED ORDER — METHOCARBAMOL 500 MG PO TABS: 500.0000 mg | ORAL_TABLET | Freq: Four times a day (QID) | ORAL | 0 refills | Status: DC | PRN

## 2016-08-20 NOTE — ED Notes (Signed)
Patient transported to X-ray 

## 2016-08-20 NOTE — ED Provider Notes (Signed)
[facility] Provider Note   CSN: [id number] Arrival date & time: 08/20/16  1529     History   Chief Complaint Chief Complaint  Patient presents with  . Back Pain  . Fall    HPI Royston BakeMargie Ree Basilia JumboCovington is a 65 y.o. female.  She had a syncopal episode 6 days ago. She states that she was on the commode in the bathroom, and the next thing she knew, she was laying half in her tub. She is complaining of pain in the left lateral rib cage and across the lower back. She rates pain at 10/10. She denied any chest pain other than the rib cage pain and she denies dyspnea or nausea. She did have some palpitations but states that is not unusual for her and she takes metoprolol for that. She has been taking ibuprofen for pain without relief. Today, her daughter gave her 2 hydrocodone tablets which did give her some relief. She is requesting pain medication now. She is also concerned about and not that she is noted in the left lateral abdominal wall.   The history is provided by the patient.    Past Medical History:  Diagnosis Date  . Bipolar 1 disorder (HCC)   . Cocaine abuse   . COPD (chronic obstructive pulmonary disease) (HCC)   . Dissociative disorder   . GERD (gastroesophageal reflux disease)   . Glaucoma   . Hypertension   . Neuropathy of foot    bilateral  . Osteoarthritis   . PTSD (post-traumatic stress disorder)   . PVD (peripheral vascular disease) (HCC) 2017  . Sleep apnea     Patient Active Problem List   Diagnosis Date Noted  . Alcohol abuse 07/31/2016  . Bipolar disorder with depression (HCC) 06/28/2016  . Chest pain 06/09/2014  . CAP (community acquired pneumonia) 06/09/2014  . COPD (chronic obstructive pulmonary disease) (HCC)   . Fibromyalgia   . Bipolar 1 disorder (HCC)   . GERD (gastroesophageal reflux disease)     Past Surgical History:  Procedure Laterality Date  . ABDOMINAL HYSTERECTOMY    . APPENDECTOMY    . CESAREAN SECTION    . EYE SURGERY      . FINGER SURGERY    . REVISION OF SCAR TISSUE RECTUS MUSCLE    . UVULOPALATOPHARYNGOPLASTY      OB History    Gravida Para Term Preterm AB Living   1             SAB TAB Ectopic Multiple Live Births                   Home Medications    Prior to Admission medications   Medication Sig Start Date End Date Taking? Authorizing Provider  albuterol (PROVENTIL HFA;VENTOLIN HFA) 108 (90 BASE) MCG/ACT inhaler Inhale 2 puffs into the lungs every 6 (six) hours as needed. For wheezing    Historical Provider, MD  aspirin 81 MG EC tablet Take 81 mg by mouth daily. Swallow whole.    Historical Provider, MD  brimonidine-timolol (COMBIGAN) 0.2-0.5 % ophthalmic solution Place 1 drop into both eyes every 12 (twelve) hours.     Historical Provider, MD  cyclobenzaprine (FLEXERIL) 10 MG tablet Take 1 tablet (10 mg total) by mouth 2 (two) times daily as needed for muscle spasms. 08/05/16   Danelle BerryLeisa Tapia, PA-C  ibuprofen (ADVIL,MOTRIN) 800 MG tablet Take 800 mg by mouth 2 (two) times daily as needed for moderate pain.  06/22/16   Historical Provider, MD  latanoprost (XALATAN) 0.005 % ophthalmic solution Place 2 drops into both eyes at bedtime.    Historical Provider, MD  linaclotide (LINZESS) 145 MCG CAPS capsule Take 145 mcg by mouth daily before breakfast.    Historical Provider, MD  lisinopril-hydrochlorothiazide (PRINZIDE,ZESTORETIC) 20-12.5 MG per tablet Take 1 tablet by mouth daily.    Historical Provider, MD  metoprolol succinate (TOPROL-XL) 50 MG 24 hr tablet Take 25 mg by mouth daily. Take with or immediately following a meal.    Historical Provider, MD  Omega-3 Fatty Acids (FISH OIL PO) Take 1 capsule by mouth daily.    Historical Provider, MD  omeprazole (PRILOSEC) 20 MG capsule Take 40 mg by mouth every morning.     Historical Provider, MD  oxyCODONE-acetaminophen (PERCOCET/ROXICET) 5-325 MG tablet Take 1 tablet by mouth every 8 (eight) hours as needed for severe pain. 08/05/16   Danelle BerryLeisa Tapia, PA-C   predniSONE (DELTASONE) 20 MG tablet Take 2 tablets (40 mg total) by mouth daily. Take 40 mg by mouth daily for 3 days, then 20mg  by mouth daily for 3 days, then 10mg  daily for 3 days 08/05/16   Danelle BerryLeisa Tapia, PA-C  tiZANidine (ZANAFLEX) 4 MG capsule Take 1 capsule (4 mg total) by mouth 3 (three) times daily. Patient taking differently: Take 4 mg by mouth 3 (three) times daily as needed for muscle spasms.  02/23/15   Arby BarretteMarcy Pfeiffer, MD  traZODone (DESYREL) 100 MG tablet Take 1 tablet (100 mg total) by mouth at bedtime. Patient taking differently: Take 150 mg by mouth at bedtime.  06/28/16   Charm RingsJamison Y Lord, NP  umeclidinium bromide (INCRUSE ELLIPTA) 62.5 MCG/INH AEPB Inhale 1 puff into the lungs daily.    Historical Provider, MD  venlafaxine XR (EFFEXOR-XR) 75 MG 24 hr capsule Take 75 mg by mouth daily with breakfast.    Historical Provider, MD  Vitamin D, Ergocalciferol, (DRISDOL) 50000 units CAPS capsule Take 50,000 Units by mouth once a week. 06/16/16   Historical Provider, MD  zolpidem (AMBIEN) 10 MG tablet Take 1 tablet (10 mg total) by mouth at bedtime as needed for sleep. Patient not taking: Reported on 07/31/2016 06/10/14 07/10/14  Calvert CantorSaima Rizwan, MD    Family History Family History  Problem Relation Age of Onset  . Other Mother     natural  . Heart attack Father   . Other Sister     kidney transplant  . Alcoholism Brother   . Goiter Sister   . Diabetes Brother   . Diabetes Sister     Social History Social History  Substance Use Topics  . Smoking status: Current Every Day Smoker    Packs/day: 0.50    Years: 20.00    Types: Cigarettes  . Smokeless tobacco: Never Used  . Alcohol use Yes     Comment: 1-2 cans of beer weekly     Allergies   Codeine; Flagyl [metronidazole]; Imipramine; Meloxicam; Metoprolol tartrate; and Vistaril [hydroxyzine hcl]   Review of Systems Review of Systems  All other systems reviewed and are negative.    Physical Exam Updated Vital Signs BP (!)  187/101 (BP Location: Left Arm)   Pulse 70   Temp 98.5 F (36.9 C) (Oral)   Resp 18   SpO2 100%   Physical Exam  Nursing note and vitals reviewed.  65 year old female, resting comfortably and in no acute distress. Vital signs are significant for hypertension. Oxygen saturation is 100%, which is normal. Head is normocephalic and atraumatic. PERRLA, EOMI. Oropharynx is  clear. Neck is nontender without adenopathy or JVD. Back is moderately tender in the mid and lower lumbar spine. There is no paralumbar spasm or tenderness. There is no CVA tenderness. Lungs are clear without rales, wheezes, or rhonchi. Chest is nontender. Heart has regular rate and rhythm without murmur. Abdomen is soft, flat, with a localized area of tenderness just superior to the left lateral iliac crest. There does appear to be a subcutaneous nodule in that area which is consistent with a hematoma. There are no masses or hepatosplenomegaly and peristalsis is normoactive. Extremities have no cyanosis or edema, full range of motion is present. Skin is warm and dry without rash. Neurologic: Mental status is normal, cranial nerves are intact, there are no motor or sensory deficits.  ED Treatments / Results  Labs (all labs ordered are listed, but only abnormal results are displayed) Labs Reviewed - No data to display  EKG  EKG Interpretation None       Radiology No results found.  Procedures Procedures (including critical care time)  Medications Ordered in ED Medications - No data to display   Initial Impression / Assessment and Plan / ED Course  I have reviewed the triage vital signs and the nursing notes.  Pertinent labs & imaging results that were available during my care of the patient were reviewed by me and considered in my medical decision making (see chart for details).  Clinical Course    Syncopal episode with apparent contusion of left lateral rib cage. Review of old records shows she has  chronic neck and back pain and her lower back pain really seems to be a continuation of her previous back problems. Syncope workup initiated. Although she has no neck complaints, cervical spine needs to be imaged as well as brain with CT scan. She is sent for x-rays of left ribs, lumbar spine, pelvis.  X-rays and labs are unremarkable. She is discharged with prescriptions for ibuprofen and oxycodone have acetaminophen. She is requesting a prescription for methocarbamol and this is given. She is referred back to her PCP for evaluation of syncope.  Final Clinical Impressions(s) / ED Diagnoses   Final diagnoses:  Syncope, unspecified syncope type  Chest wall contusion, left, initial encounter  Acute exacerbation of chronic low back pain    New Prescriptions New Prescriptions   IBUPROFEN (ADVIL,MOTRIN) 800 MG TABLET    Take 1 tablet (800 mg total) by mouth 3 (three) times daily.   METHOCARBAMOL (ROBAXIN) 500 MG TABLET    Take 1 tablet (500 mg total) by mouth every 6 (six) hours as needed for muscle spasms.   OXYCODONE-ACETAMINOPHEN (PERCOCET) 7.5-325 MG TABLET    Take 1 tablet by mouth every 4 (four) hours as needed for severe pain.     Dione Booze, MD 08/20/16 [phone]

## 2016-08-20 NOTE — ED Triage Notes (Signed)
Pt has multiple complaints. Reports possible syncopal episode on Monday night. Fell and hit left ribs on bathtub which is causing severe pain. Also has back pain, hx of same. Reports having small mass to left side and is concerned for possible blood clot.

## 2016-08-22 ENCOUNTER — Ambulatory Visit (INDEPENDENT_AMBULATORY_CARE_PROVIDER_SITE_OTHER): Payer: Medicare Other | Admitting: Orthopaedic Surgery

## 2016-08-22 ENCOUNTER — Encounter (INDEPENDENT_AMBULATORY_CARE_PROVIDER_SITE_OTHER): Payer: Self-pay | Admitting: Orthopaedic Surgery

## 2016-08-22 VITALS — BP 129/79 | HR 57

## 2016-08-22 DIAGNOSIS — M5412 Radiculopathy, cervical region: Secondary | ICD-10-CM | POA: Diagnosis not present

## 2016-08-22 DIAGNOSIS — G8929 Other chronic pain: Secondary | ICD-10-CM | POA: Diagnosis not present

## 2016-08-22 DIAGNOSIS — M5416 Radiculopathy, lumbar region: Secondary | ICD-10-CM | POA: Diagnosis not present

## 2016-08-22 DIAGNOSIS — M5441 Lumbago with sciatica, right side: Secondary | ICD-10-CM | POA: Diagnosis not present

## 2016-08-22 DIAGNOSIS — M5442 Lumbago with sciatica, left side: Secondary | ICD-10-CM

## 2016-08-22 DIAGNOSIS — M542 Cervicalgia: Secondary | ICD-10-CM

## 2016-08-22 NOTE — Patient Instructions (Signed)
Avoid heavy lifting, bending or twisting.

## 2016-08-22 NOTE — Progress Notes (Signed)
Office Visit Note   Patient: Cassie Harrell           Date of Birth: 02-22-1951           MRN: [id number] Visit Date: 08/22/2016 Requested by: Fleet ContrasEdwin Avbuere, MD [address] [place], [place] [id number] PCP: Dorrene GermanEdwin A Avbuere, MD  Subjective: Chief Complaint  Patient presents with  . Lower Back - Pain  . Neck - Pain    Patient comes in today complaining of neck pain and low back pain.  She complains of neck pain x 2 months.  NKI.  She has pain in the posterior neck, moves to bilateral sholders/arms.  L>R, Has numbness in both hands.  It is very severe.  Hurts to turn her neck, she has limited ROM turning to left.   She passed out on 08/20/16, landed half in and half out of bathtub.  She had bruised ribs, blood cot left pelvis area per patient report.  The low back has been hurting since the fall, upper lumbar pain, LBP with bilateral sciatica.  No n/t in legs, just pain.   CT Cspine and Xrays both are on epic/canopy for review.     States that she's continued to have ongoing pain since her fall. ER provider gave oral prednisone taper, and muscle relaxer. States that the prednisone did give her some temporary improvement of her symptoms. Resume ongoing that radiates to the bilateral shoulder scapular region and down her arms. Also has numbness and tingling in her arms. States that she has a history of arthritis in her neck. No back pain also radiates to the bilateral buttocks and down to her feet at times. Patient states that she went to the ER for neck pain and states that the pain was so bad that she wanted to "kill myself".  Patient admits to history of psychiatric issues. States her primary care physician stopped Giving her narcotics.   Patient has cervical spine x-rays done at the hospital 08/05/2016 report read loss of cervical lordosis. This Bryant Lipps be secondary to splinting, soft tissue injury or positioning. Degenerative changes are identified at C4-5 and C5-6. 3 mm retrolisthesis of  C4 on C5. 3 mm of retrolisthesis of C5 on C6. Appearance with similar to the prior study from 2014. No acute fracture identified. Patient had CT cervical spine after her recent fall 08/20/2016 report read cerebral atrophy without acute intracranial abnormality. No acute fracture or subluxation in the cervical spine. Straightening of expected cervical lordosis could be positional due to muscular spasm or ligamentous injury. Left neural foraminal and left canal narrowing at C5-6 secondary to disc osteophyte complex and uncovertebral joint hypertrophy. Mild C4-5 central canal stenosis. Loss of intervertebral disc height at C4-5. X-rays of the lumbar spine from 08/20/2016 report read no evidence of acute fracture or traumatic malalignment. Mild levocurvature of the lumbar spine which was also seen previously. No focal or significant degenerative changes.           Review of Systems  Constitutional: Positive for activity change.  HENT: Negative.   Respiratory: Negative.   Cardiovascular: Negative.   Gastrointestinal: Negative.   Musculoskeletal: Positive for back pain and neck pain.  Neurological: Positive for numbness.     Assessment & Plan: Visit Diagnoses:  1. Cervicalgia   2. Chronic bilateral low back pain with bilateral sciatica   3. Radiculopathy, cervical region   4. Lumbar radiculopathy     Plan: Since patient has failed conservative treatment at this point with oral  prednisone, muscle relaxer . She actually states that her symptoms are worsening so we will schedule cervical and lumbar spine MRI scans rule out HNP/stenosis.  Patient requested pain medication and I advised her to continue what she is taking. No narcotic prescriptions given. Follow-up in the office after completion of her studies to discuss results and further treatment options. All questions answered.  Follow-Up Instructions: Return for yates after mri scans.   Orders:  Orders Placed This Encounter  Procedures  . MR  Cervical Spine w/o contrast  . MR Lumbar Spine w/o contrast   No orders of the defined types were placed in this encounter.     Procedures: No procedures performed   Clinical Data: No additional findings.  Objective: Vital Signs: BP 129/79   Pulse (!) 57   Physical Exam  Constitutional: She is oriented to person, place, and time. No distress.  HENT:  Head: Normocephalic.  Eyes: EOM are normal. Pupils are equal, round, and reactive to light.  Neck: Normal range of motion.  Pulmonary/Chest: Effort normal.  Abdominal: She exhibits no distension.  Neurological: She is alert and oriented to person, place, and time.  Skin: Skin is warm.    Ortho Exam Gait is somewhat antalgic. Positive lumbar paraspinal tenderness. Positive bilateral sciatic notch tenderness. Tender over the bilateral hip greater trochanter bursa.  Cervical spine good range of motion. Positive bilateral brachial plexus trapezius and scapular tenderness. Bilateral shoulders good range of motion. Negative impingement test. Negative drop arm test. Negative logroll bilaterally. She has pain in the right low back and buttock with straight leg raise. Negative on the left side. No focal motor deficits. Neurovascularly intact. Skin warm and dry. Specialty Comments:  No specialty comments available.  Imaging: No results found.   PMFS History: Patient Active Problem List   Diagnosis Date Noted  . Alcohol abuse 07/31/2016  . Bipolar disorder with depression (HCC) 06/28/2016  . Chest pain 06/09/2014  . CAP (community acquired pneumonia) 06/09/2014  . COPD (chronic obstructive pulmonary disease) (HCC)   . Fibromyalgia   . Bipolar 1 disorder (HCC)   . GERD (gastroesophageal reflux disease)    Past Medical History:  Diagnosis Date  . Bipolar 1 disorder (HCC)   . Cocaine abuse   . COPD (chronic obstructive pulmonary disease) (HCC)   . Dissociative disorder   . GERD (gastroesophageal reflux disease)   . Glaucoma     . Hypertension   . Neuropathy of foot    bilateral  . Osteoarthritis   . PTSD (post-traumatic stress disorder)   . PVD (peripheral vascular disease) (HCC) 2017  . Sleep apnea     Family History  Problem Relation Age of Onset  . Other Mother     natural  . Heart attack Father   . Other Sister     kidney transplant  . Alcoholism Brother   . Goiter Sister   . Diabetes Brother   . Diabetes Sister     Past Surgical History:  Procedure Laterality Date  . ABDOMINAL HYSTERECTOMY    . APPENDECTOMY    . CESAREAN SECTION    . EYE SURGERY    . FINGER SURGERY    . REVISION OF SCAR TISSUE RECTUS MUSCLE    . UVULOPALATOPHARYNGOPLASTY     Social History   Occupational History  .  Other    disabled   Social History Main Topics  . Smoking status: Current Every Day Smoker    Packs/day: 0.50    Years: 20.00  Types: Cigarettes  . Smokeless tobacco: Never Used  . Alcohol use Yes     Comment: 1-2 cans of beer weekly  . Drug use:     Types: Cocaine     Comment: relapsed and used Cocaine on Friday 12/2014)  . Sexual activity: No

## 2016-08-23 ENCOUNTER — Other Ambulatory Visit: Payer: Self-pay

## 2016-08-23 NOTE — Patient Outreach (Signed)
[facility]) Care Management  08/23/2016  Cassie BakeMargie Harrell [facility] 11-Apr-1951 [id number]   Referral date: 08/21/16 Referral source: Emergency department census Referral Reason: 4 or more emergency department visits in the past 6 months.   Telephone call to patient regarding referral. Unable to reach patient. HIPAA compliant voice message left with call back phone number.   PLAN; RNCM will attempt 2nd telephone call within 1 week.   George InaDavina Azaylah Stailey RN,BSN,CCM [facility] Telephonic  [phone]

## 2016-08-25 ENCOUNTER — Other Ambulatory Visit: Payer: Self-pay

## 2016-08-25 NOTE — Patient Outreach (Signed)
[facility]) Care Management  08/25/2016  Cassie BakeMargie Harrell [place] 21-Jan-1951 [id number]   Second telephone call to patient regarding  ED census referral.  Unable to reach patient. HIPAA compliant voice message left with call back phone number.   PLAN; RNCM will attempt 3rd telephone outreach within 1 week.   George InaDavina Nanami Whitelaw RN,BSN,CCM [facility] Telephonic  [phone]

## 2016-08-28 ENCOUNTER — Ambulatory Visit: Payer: Self-pay

## 2016-08-30 ENCOUNTER — Other Ambulatory Visit: Payer: Self-pay

## 2016-08-31 ENCOUNTER — Ambulatory Visit (HOSPITAL_COMMUNITY)
Admission: RE | Admit: 2016-08-31 | Discharge: 2016-08-31 | Disposition: A | Discharge status: Home / Self Care | Payer: Medicare Other | Source: Ambulatory Visit | Attending: Surgery | Admitting: Surgery

## 2016-08-31 DIAGNOSIS — M5126 Other intervertebral disc displacement, lumbar region: Secondary | ICD-10-CM | POA: Diagnosis not present

## 2016-08-31 DIAGNOSIS — M5412 Radiculopathy, cervical region: Secondary | ICD-10-CM | POA: Insufficient documentation

## 2016-08-31 DIAGNOSIS — M8938 Hypertrophy of bone, other site: Secondary | ICD-10-CM | POA: Insufficient documentation

## 2016-08-31 DIAGNOSIS — M4802 Spinal stenosis, cervical region: Secondary | ICD-10-CM | POA: Diagnosis not present

## 2016-08-31 DIAGNOSIS — M4302 Spondylolysis, cervical region: Secondary | ICD-10-CM | POA: Diagnosis not present

## 2016-08-31 DIAGNOSIS — M5416 Radiculopathy, lumbar region: Secondary | ICD-10-CM | POA: Insufficient documentation

## 2016-08-31 DIAGNOSIS — M4602 Spinal enthesopathy, cervical region: Secondary | ICD-10-CM | POA: Insufficient documentation

## 2016-08-31 NOTE — Patient Outreach (Signed)
[facility]) Care Management  08/31/2016  Royston BakeMargie Harrell [facility] 1951-08-24 [id number]  Telephone call to patient regarding emergency room census referral.  HPAA verified with patient.  Patient confirms primary MD, Dr. Claudie ReveringAvubere.   Discussed with patient referring patient to Partnership for community care for additional resources/ follow up.   PLAN:  RNCM will refer patient to care management assistant to close due to non participating provider.  George InaDavina Iliani Vejar RN,BSN,CCM [facility] Telephonic  [phone]

## 2016-09-04 ENCOUNTER — Other Ambulatory Visit: Payer: Self-pay

## 2016-09-04 NOTE — Patient Outreach (Signed)
[facility]) Care Management  09/04/2016  [name] 04/15/1951 [id number]   Telephone call to patient regarding ED census referral.  HIPAA verified with patient.  RNCM explained to patient her primary care provider is not an in plan provider for [facility] care management program.  RNCM provided to patient contact name and phone number for partnership for community care program and [facility] resource.  Patient verbalized appreciation for provided resources.   PLAN;  RNCM will refer patient to case management assistant to close due to not being eligible for benefit.  [name] RN,BSN,CCM [facility] Telephonic  [phone]

## 2016-09-17 ENCOUNTER — Emergency Department (HOSPITAL_COMMUNITY)
Admission: EM | Admit: 2016-09-17 | Discharge: 2016-09-17 | Disposition: A | Discharge status: Home / Self Care | Payer: Medicare Other | Attending: Emergency Medicine | Admitting: Emergency Medicine

## 2016-09-17 ENCOUNTER — Encounter (HOSPITAL_COMMUNITY): Payer: Self-pay | Admitting: Emergency Medicine

## 2016-09-17 DIAGNOSIS — S0502XA Injury of conjunctiva and corneal abrasion without foreign body, left eye, initial encounter: Secondary | ICD-10-CM | POA: Diagnosis not present

## 2016-09-17 DIAGNOSIS — M47812 Spondylosis without myelopathy or radiculopathy, cervical region: Secondary | ICD-10-CM | POA: Insufficient documentation

## 2016-09-17 DIAGNOSIS — W5503XA Scratched by cat, initial encounter: Secondary | ICD-10-CM | POA: Insufficient documentation

## 2016-09-17 DIAGNOSIS — Y999 Unspecified external cause status: Secondary | ICD-10-CM | POA: Insufficient documentation

## 2016-09-17 DIAGNOSIS — J449 Chronic obstructive pulmonary disease, unspecified: Secondary | ICD-10-CM | POA: Diagnosis not present

## 2016-09-17 DIAGNOSIS — Z7982 Long term (current) use of aspirin: Secondary | ICD-10-CM | POA: Insufficient documentation

## 2016-09-17 DIAGNOSIS — I10 Essential (primary) hypertension: Secondary | ICD-10-CM | POA: Insufficient documentation

## 2016-09-17 DIAGNOSIS — Y939 Activity, unspecified: Secondary | ICD-10-CM | POA: Insufficient documentation

## 2016-09-17 DIAGNOSIS — S0592XA Unspecified injury of left eye and orbit, initial encounter: Secondary | ICD-10-CM | POA: Diagnosis present

## 2016-09-17 DIAGNOSIS — Y929 Unspecified place or not applicable: Secondary | ICD-10-CM | POA: Diagnosis not present

## 2016-09-17 DIAGNOSIS — F17210 Nicotine dependence, cigarettes, uncomplicated: Secondary | ICD-10-CM | POA: Insufficient documentation

## 2016-09-17 MED ORDER — ERYTHROMYCIN 5 MG/GM OP OINT
1.0000 "application " | TOPICAL_OINTMENT | Freq: Once | OPHTHALMIC | Status: DC
  Filled 2016-09-17: qty 3.5

## 2016-09-17 MED ORDER — TETRACAINE HCL 0.5 % OP SOLN
2.0000 [drp] | Freq: Once | OPHTHALMIC | Status: DC
  Filled 2016-09-17: qty 2

## 2016-09-17 MED ORDER — FLUORESCEIN SODIUM 1 MG OP STRP
ORAL_STRIP | OPHTHALMIC | Status: AC
  Filled 2016-09-17: qty 1

## 2016-09-17 MED ORDER — HYDROCODONE-ACETAMINOPHEN 5-325 MG PO TABS: 1.0000 | ORAL_TABLET | Freq: Four times a day (QID) | ORAL | 0 refills | Status: DC | PRN

## 2016-09-17 MED ORDER — TETANUS-DIPHTH-ACELL PERTUSSIS 5-2.5-18.5 LF-MCG/0.5 IM SUSP
0.5000 mL | Freq: Once | INTRAMUSCULAR | Status: AC
  Administered 2016-09-17: 0.5 mL via INTRAMUSCULAR
  Filled 2016-09-17: qty 0.5

## 2016-09-17 NOTE — Discharge Instructions (Signed)
Use the eye ointment 2 or 3 times a day, apply a small strip, until all the redness and discomfort has resolved.  Call your orthopedic doctor for a follow-up appointment regarding the cervical spondylosis.

## 2016-09-17 NOTE — ED Notes (Signed)
Pt is in stable condition and ambulates from ED. 

## 2016-09-17 NOTE — ED Triage Notes (Signed)
Pt. Stated, My cat was playing with my eyelashes and scratched my left eye.

## 2016-09-17 NOTE — ED Provider Notes (Signed)
[facility]EM[facility]Y DEPT Provider Note   CSN: [id number] Arrival date & time: 09/17/16  1421     History   Chief Complaint Chief Complaint  Patient presents with  . Eye Injury  . Back Pain    HPI Cassie Harrell is a 65 y.o. female.  She presents for evaluation of a left eye injury, when her cat accidentally scratched it. She denies vision loss. She has left eye pain. She also complains of ongoing neck and upper back pain associated with "cervical spondylosis". She states that she had an MRI recently, but has not gotten the results back yet. She denies fever, chills, nausea, vomiting, cough, shortness of breath, weakness or dizziness. There are no other known modifying factors  HPI  Past Medical History:  Diagnosis Date  . Bipolar 1 disorder (HCC)   . Cocaine abuse   . COPD (chronic obstructive pulmonary disease) (HCC)   . Dissociative disorder   . GERD (gastroesophageal reflux disease)   . Glaucoma   . Hypertension   . Neuropathy of foot    bilateral  . Osteoarthritis   . PTSD (post-traumatic stress disorder)   . PVD (peripheral vascular disease) (HCC) 2017  . Sleep apnea     Patient Active Problem List   Diagnosis Date Noted  . Alcohol abuse 07/31/2016  . Bipolar disorder with depression (HCC) 06/28/2016  . Chest pain 06/09/2014  . CAP (community acquired pneumonia) 06/09/2014  . COPD (chronic obstructive pulmonary disease) (HCC)   . Fibromyalgia   . Bipolar 1 disorder (HCC)   . GERD (gastroesophageal reflux disease)     Past Surgical History:  Procedure Laterality Date  . ABDOMINAL HYSTERECTOMY    . APPENDECTOMY    . CESAREAN SECTION    . EYE SURGERY    . FINGER SURGERY    . REVISION OF SCAR TISSUE RECTUS MUSCLE    . UVULOPALATOPHARYNGOPLASTY      OB History    Gravida Para Term Preterm AB Living   1             SAB TAB Ectopic Multiple Live Births                   Home Medications    Prior to Admission medications   Medication Sig Start  Date End Date Taking? Authorizing Provider  albuterol (PROVENTIL HFA;VENTOLIN HFA) 108 (90 BASE) MCG/ACT inhaler Inhale 2 puffs into the lungs every 6 (six) hours as needed. For wheezing   Yes Historical Provider, MD  aspirin 81 MG EC tablet Take 81 mg by mouth daily. Swallow whole.   Yes Historical Provider, MD  brimonidine-timolol (COMBIGAN) 0.2-0.5 % ophthalmic solution Place 1 drop into both eyes every 12 (twelve) hours.    Yes Historical Provider, MD  ibuprofen (ADVIL,MOTRIN) 800 MG tablet Take 1 tablet (800 mg total) by mouth 3 (three) times daily. Patient taking differently: Take 800 mg by mouth every 8 (eight) hours as needed for moderate pain.  08/20/16  Yes Dione Booze, MD  latanoprost (XALATAN) 0.005 % ophthalmic solution Place 2 drops into both eyes at bedtime.   Yes Historical Provider, MD  linaclotide (LINZESS) 145 MCG CAPS capsule Take 145 mcg by mouth daily before breakfast.   Yes Historical Provider, MD  lisinopril-hydrochlorothiazide (PRINZIDE,ZESTORETIC) 20-12.5 MG per tablet Take 1 tablet by mouth daily.   Yes Historical Provider, MD  metoprolol succinate (TOPROL-XL) 50 MG 24 hr tablet Take 50 mg by mouth daily. Take with or immediately following a meal.  Yes Historical Provider, MD  Omega-3 Fatty Acids (FISH OIL PO) Take 1 capsule by mouth daily.   Yes Historical Provider, MD  omeprazole (PRILOSEC) 20 MG capsule Take 40 mg by mouth every morning.    Yes Historical Provider, MD  oxyCODONE-acetaminophen (PERCOCET) 7.5-325 MG tablet Take 1 tablet by mouth every 4 (four) hours as needed for severe pain. 08/20/16  Yes Dione Boozeavid Glick, MD  tiZANidine (ZANAFLEX) 4 MG capsule Take 1 capsule (4 mg total) by mouth 3 (three) times daily. Patient taking differently: Take 4 mg by mouth 3 (three) times daily as needed for muscle spasms.  02/23/15  Yes Arby BarretteMarcy Pfeiffer, MD  traZODone (DESYREL) 150 MG tablet Take 150 mg by mouth at bedtime.   Yes Historical Provider, MD  umeclidinium bromide (INCRUSE  ELLIPTA) 62.5 MCG/INH AEPB Inhale 1 puff into the lungs daily.   Yes Historical Provider, MD  venlafaxine XR (EFFEXOR-XR) 75 MG 24 hr capsule Take 75 mg by mouth daily with breakfast.   Yes Historical Provider, MD  Vitamin D, Ergocalciferol, (DRISDOL) 50000 units CAPS capsule Take 50,000 Units by mouth once a week. 06/16/16  Yes Historical Provider, MD  HYDROcodone-acetaminophen (NORCO) 5-325 MG tablet Take 1 tablet by mouth every 6 (six) hours as needed for moderate pain. 09/17/16   Mancel BaleElliott Marybeth Dandy, MD    Family History Family History  Problem Relation Age of Onset  . Other Mother     natural  . Heart attack Father   . Other Sister     kidney transplant  . Alcoholism Brother   . Goiter Sister   . Diabetes Brother   . Diabetes Sister     Social History Social History  Substance Use Topics  . Smoking status: Current Every Day Smoker    Packs/day: 0.50    Years: 20.00    Types: Cigarettes  . Smokeless tobacco: Never Used  . Alcohol use Yes     Comment: 1-2 cans of beer weekly     Allergies   Seroquel [quetiapine]; Codeine; Diclofenac; Flagyl [metronidazole]; Imipramine; Meloxicam; Metoprolol tartrate; Olanzapine; and Vistaril [hydroxyzine hcl]   Review of Systems Review of Systems  All other systems reviewed and are negative.    Physical Exam Updated Vital Signs BP 133/75   Pulse 71   Temp 98.2 F (36.8 C) (Oral)   Resp 16   Ht 5\' 3"  (1.6 m)   Wt 144 lb (65.3 kg)   SpO2 100%   BMI 25.51 kg/m   Physical Exam  Constitutional: She is oriented to person, place, and time. She appears well-developed and well-nourished.  HENT:  Head: Normocephalic and atraumatic.  Left eye subconjunctival hemorrhage, with visible injury at 5:00 on the sclera of the globe. No drainage. Globe intact.  Eyes: Conjunctivae and EOM are normal. Pupils are equal, round, and reactive to light.  Neck: Normal range of motion and phonation normal. Neck supple.  Cardiovascular: Normal rate.     Pulmonary/Chest: Effort normal. She exhibits no tenderness.  Musculoskeletal: Normal range of motion.  Neurological: She is alert and oriented to person, place, and time. She exhibits normal muscle tone.  Skin: Skin is warm and dry.  Psychiatric: She has a normal mood and affect. Her behavior is normal. Judgment and thought content normal.  Nursing note and vitals reviewed.    ED Treatments / Results  Labs (all labs ordered are listed, but only abnormal results are displayed) Labs Reviewed - No data to display  EKG  EKG Interpretation None  Radiology No results found.  Procedures Procedures (including critical care time)  Medications Ordered in ED Medications  tetracaine (PONTOCAINE) 0.5 % ophthalmic solution 2 drop (not administered)  fluorescein 1 MG ophthalmic strip (not administered)  erythromycin ophthalmic ointment 1 application (not administered)  Tdap (BOOSTRIX) injection 0.5 mL (0.5 mLs Intramuscular Given 09/17/16 1755)     Initial Impression / Assessment and Plan / ED Course  I have reviewed the triage vital signs and the nursing notes.  Pertinent labs & imaging results that were available during my care of the patient were reviewed by me and considered in my medical decision making (see chart for details).  Clinical Course     Medications  tetracaine (PONTOCAINE) 0.5 % ophthalmic solution 2 drop (not administered)  fluorescein 1 MG ophthalmic strip (not administered)  erythromycin ophthalmic ointment 1 application (not administered)  Tdap (BOOSTRIX) injection 0.5 mL (0.5 mLs Intramuscular Given 09/17/16 1755)    Patient Vitals for the past 24 hrs:  BP Temp Temp src Pulse Resp SpO2 Height Weight  09/17/16 1645 133/75 - - 71 - 100 % - -  09/17/16 1630 119/70 - - 71 - 100 % - -  09/17/16 1615 116/73 - - 68 - 100 % - -  09/17/16 1600 122/66 - - 69 - 100 % - -  09/17/16 1545 (!) 131/106 - - 71 - 100 % - -  09/17/16 1530 115/71 - - 68 - 100 % - -   09/17/16 1428 121/72 98.2 F (36.8 C) Oral 73 16 97 % 5\' 3"  (1.6 m) 144 lb (65.3 kg)   Eye exam, left eye, using tetracaine anesthetic, and fluorescein. Small abrasion, noted as on physical examination without other injury or foreign body, visualized with Wood's lamp.   5:59 PM Reevaluation with update and discussion. After initial assessment and treatment, an updated evaluation reveals No change in clinical status. Findings discussed. Patient, all questions answered.Mancel Bale. Lebron Nauert L    Final Clinical Impressions(s) / ED Diagnoses   Final diagnoses:  Abrasion of left cornea, initial encounter  Osteoarthritis of cervical spine, unspecified spinal osteoarthritis complication status   Left eye injury, corneal abrasion with subconjunctival hemorrhage. Doubt deep injury. Also, neck and upper back pain, ongoing. Doubt spinal myelopathy.   Nursing Notes Reviewed/ Care Coordinated Applicable Imaging Reviewed Interpretation of Laboratory Data incorporated into ED treatment  The patient appears reasonably screened and/or stabilized for discharge and I doubt any other medical condition or other [facility] requiring further screening, evaluation, or treatment in the ED at this time prior to discharge.  Plan: Home Medications- continue usual, E-mycin ointment to go 2-3 X day until improves; Home Treatments- rest; return here if the recommended treatment, does not improve the symptoms; Recommended follow up- PCP 1 week  New Prescriptions New Prescriptions   HYDROCODONE-ACETAMINOPHEN (NORCO) 5-325 MG TABLET    Take 1 tablet by mouth every 6 (six) hours as needed for moderate pain.     Mancel BaleElliott Cathryne Mancebo, MD 09/17/16 1806

## 2016-09-17 NOTE — ED Notes (Signed)
ED Provider student) at bedside. 

## 2016-09-22 ENCOUNTER — Ambulatory Visit (INDEPENDENT_AMBULATORY_CARE_PROVIDER_SITE_OTHER): Payer: Medicare Other | Admitting: Orthopaedic Surgery

## 2016-09-22 ENCOUNTER — Encounter (INDEPENDENT_AMBULATORY_CARE_PROVIDER_SITE_OTHER): Payer: Self-pay | Admitting: Orthopaedic Surgery

## 2016-09-22 VITALS — BP 146/73 | HR 76 | Ht 63.0 in | Wt 144.0 lb

## 2016-09-22 DIAGNOSIS — M47812 Spondylosis without myelopathy or radiculopathy, cervical region: Secondary | ICD-10-CM | POA: Diagnosis not present

## 2016-09-22 DIAGNOSIS — G8929 Other chronic pain: Secondary | ICD-10-CM | POA: Diagnosis not present

## 2016-09-22 DIAGNOSIS — M545 Low back pain: Secondary | ICD-10-CM

## 2016-09-22 NOTE — Progress Notes (Signed)
Office Visit Note   Patient: Cassie Harrell           Date of Birth: 1951-06-26           MRN: [id number] Visit Date: 09/22/2016              Requested by: Fleet ContrasEdwin Avbuere, MD [address] [place], [place] [id number] PCP: Dorrene GermanEdwin A Avbuere, MD   Assessment & Plan: Visit Diagnoses:  1. Spondylosis of cervical region without myelopathy or radiculopathy   2. Chronic left-sided low back pain without sciatica     Plan: Patient's the lumbar MRI shows some disc bulge L4-5 on the left which corresponds with her symptoms which is mild. She has more neck and shoulder problems and also numbness in her hand more than her low back bothers her currently. She has 4 discs show some degenerative changes. Centrally C3-4 looks the worst and the lateral foraminal E C4-5 and C5-6 with the worse. There is minimal disc bulge at C6-7. We'll set her up for some outpatient therapy recheck her after the holidays. Patient requested narcotic medication on discussed with her it is not wise to take narcotic medication for chronic disc degeneration since it can lead to the addiction problems and narcotic pain medication and generally not effective for control of this pain. She'll continue with the walking program and all follow-up after all days and we can recheck her and then make a determination about the possible surgical intervention for cervical spine  Follow-Up Instructions: Return in about 1 month (around 10/23/2016).   Orders:  No orders of the defined types were placed in this encounter.  No orders of the defined types were placed in this encounter.     Procedures: No procedures performed   Clinical Data: No additional findings.   Subjective: Chief Complaint  Patient presents with  . Neck - Follow-up  . Lower Back - Follow-up    Cassie Harrell is here to review MRI's of Lumbar and Cervical spines.  She states that she is having the same pain as before.    Review of Systems    Constitutional: Negative for chills and diaphoresis.  HENT: Negative for ear discharge, ear pain and nosebleeds.   Eyes: Negative for discharge and visual disturbance.  Respiratory: Negative for cough, choking and shortness of breath.   Cardiovascular: Negative for chest pain and palpitations.  Gastrointestinal: Negative for abdominal distention and abdominal pain.  Endocrine: Negative for cold intolerance and heat intolerance.  Genitourinary: Negative for flank pain and hematuria.  Musculoskeletal:       She complains of the hand numbness whole hand neck pain shoulder pain pain in her shoulder blades low back pain and pain that radiates into her left thigh  Skin: Negative for rash and wound.  Neurological: Negative for seizures and speech difficulty.  Hematological: Negative for adenopathy. Does not bruise/bleed easily.  Psychiatric/Behavioral: Negative for agitation and suicidal ideas.       Positive for memory loss positive for some use of narcotics the last approximately 8 months either hydrocodone or Percocet for pain. She complains of neck pain and hand pain. Somewhat poor historian. Previous head CT showed more atrophy than expected for her age     Objective: Vital Signs: BP (!) 146/73 (BP Location: Right Arm, Patient Position: Sitting)   Pulse 76   Ht 5\' 3"  (1.6 m)   Wt 144 lb (65.3 kg)   BMI 25.51 kg/m   Physical Exam  Constitutional: She is oriented  to person, place, and time. She appears well-developed.  HENT:  Head: Normocephalic.  Right Ear: External ear normal.  Left Ear: External ear normal.  Eyes: Pupils are equal, round, and reactive to light.  Neck: No tracheal deviation present. No thyromegaly present.  Cardiovascular: Normal rate.   Pulmonary/Chest: Effort normal.  Abdominal: Soft.  Musculoskeletal:  Symmetric upper and lower 70 reflexes some pain with the brachial plexus compression negative straight leg raising she has some sciatic notch tenderness on the  left. Negative after compression-she is able to heel and toe walk and off the exam table comfortably.  Neurological: She is alert and oriented to person, place, and time.  Skin: Skin is warm and dry.  Psychiatric: She has a normal mood and affect. Her behavior is normal.  Patient somewhat poor historian. Questions have to be rephrased in order to get an answer to question. She is so pleasant and conversant.    Ortho Exam  Specialty Comments:  No specialty comments available.  Imaging: No results found.   PMFS History: Patient Active Problem List   Diagnosis Date Noted  . Alcohol abuse 07/31/2016  . Bipolar disorder with depression (HCC) 06/28/2016  . Chest pain 06/09/2014  . CAP (community acquired pneumonia) 06/09/2014  . COPD (chronic obstructive pulmonary disease) (HCC)   . Fibromyalgia   . Bipolar 1 disorder (HCC)   . GERD (gastroesophageal reflux disease)    Past Medical History:  Diagnosis Date  . Bipolar 1 disorder (HCC)   . Cocaine abuse   . COPD (chronic obstructive pulmonary disease) (HCC)   . Dissociative disorder   . GERD (gastroesophageal reflux disease)   . Glaucoma   . Hypertension   . Neuropathy of foot    bilateral  . Osteoarthritis   . PTSD (post-traumatic stress disorder)   . PVD (peripheral vascular disease) (HCC) 2017  . Sleep apnea     Family History  Problem Relation Age of Onset  . Other Mother     natural  . Heart attack Father   . Other Sister     kidney transplant  . Alcoholism Brother   . Goiter Sister   . Diabetes Brother   . Diabetes Sister     Past Surgical History:  Procedure Laterality Date  . ABDOMINAL HYSTERECTOMY    . APPENDECTOMY    . CESAREAN SECTION    . EYE SURGERY    . FINGER SURGERY    . REVISION OF SCAR TISSUE RECTUS MUSCLE    . UVULOPALATOPHARYNGOPLASTY     Social History   Occupational History  .  Other    disabled   Social History Main Topics  . Smoking status: Current Every Day Smoker     Packs/day: 0.50    Years: 20.00    Types: Cigarettes  . Smokeless tobacco: Never Used  . Alcohol use Yes     Comment: 1-2 cans of beer weekly  . Drug use:     Types: Cocaine     Comment: relapsed and used Cocaine on Friday 12/2014)  . Sexual activity: No

## 2016-10-02 ENCOUNTER — Telehealth (INDEPENDENT_AMBULATORY_CARE_PROVIDER_SITE_OTHER): Payer: Self-pay | Admitting: Orthopaedic Surgery

## 2016-10-02 NOTE — Telephone Encounter (Signed)
I called her and discussed. As per the office visit note narcotics is not the answer. Has therapy starting this week, ice , heat, walking program and return Jan 11th as scheduled recommended. FYI

## 2016-10-02 NOTE — Telephone Encounter (Signed)
Please advise 

## 2016-10-02 NOTE — Telephone Encounter (Signed)
Patient called asked if she can be [facility].  The number to contact her is [phone]

## 2016-10-02 NOTE — Telephone Encounter (Signed)
noted 

## 2016-10-03 ENCOUNTER — Ambulatory Visit: Payer: Medicare Other | Admitting: Physical Therapy

## 2016-10-12 ENCOUNTER — Ambulatory Visit: Payer: Medicare Other | Admitting: Physical Therapy

## 2016-10-24 ENCOUNTER — Ambulatory Visit (INDEPENDENT_AMBULATORY_CARE_PROVIDER_SITE_OTHER): Payer: Medicare Other | Admitting: Orthopaedic Surgery

## 2016-10-25 ENCOUNTER — Ambulatory Visit (INDEPENDENT_AMBULATORY_CARE_PROVIDER_SITE_OTHER): Payer: Medicare Other | Admitting: Orthopaedic Surgery

## 2016-10-27 ENCOUNTER — Ambulatory Visit: Payer: Medicare Other | Attending: Orthopaedic Surgery | Admitting: Physical Therapy

## 2016-10-27 DIAGNOSIS — R262 Difficulty in walking, not elsewhere classified: Secondary | ICD-10-CM | POA: Insufficient documentation

## 2016-10-27 DIAGNOSIS — M62830 Muscle spasm of back: Secondary | ICD-10-CM | POA: Insufficient documentation

## 2016-10-27 DIAGNOSIS — M542 Cervicalgia: Secondary | ICD-10-CM | POA: Insufficient documentation

## 2016-10-27 DIAGNOSIS — M5442 Lumbago with sciatica, left side: Secondary | ICD-10-CM | POA: Insufficient documentation

## 2016-11-05 ENCOUNTER — Emergency Department (HOSPITAL_COMMUNITY)
Admission: EM | Admit: 2016-11-05 | Discharge: 2016-11-05 | Disposition: A | Discharge status: Home / Self Care | Payer: Medicare Other | Attending: Emergency Medicine | Admitting: Emergency Medicine

## 2016-11-05 ENCOUNTER — Emergency Department (HOSPITAL_COMMUNITY): Payer: Medicare Other

## 2016-11-05 ENCOUNTER — Encounter (HOSPITAL_COMMUNITY): Payer: Self-pay

## 2016-11-05 DIAGNOSIS — R002 Palpitations: Secondary | ICD-10-CM | POA: Diagnosis present

## 2016-11-05 DIAGNOSIS — Z79899 Other long term (current) drug therapy: Secondary | ICD-10-CM | POA: Diagnosis not present

## 2016-11-05 DIAGNOSIS — J449 Chronic obstructive pulmonary disease, unspecified: Secondary | ICD-10-CM | POA: Diagnosis not present

## 2016-11-05 DIAGNOSIS — I10 Essential (primary) hypertension: Secondary | ICD-10-CM | POA: Insufficient documentation

## 2016-11-05 DIAGNOSIS — Z7982 Long term (current) use of aspirin: Secondary | ICD-10-CM | POA: Diagnosis not present

## 2016-11-05 DIAGNOSIS — F17210 Nicotine dependence, cigarettes, uncomplicated: Secondary | ICD-10-CM | POA: Insufficient documentation

## 2016-11-05 LAB — BASIC METABOLIC PANEL
Anion gap: 7 (ref 5–15)
BUN: 11 mg/dL (ref 6–20)
CO2: 23 mmol/L (ref 22–32)
CREATININE: 0.75 mg/dL (ref 0.44–1.00)
Calcium: 9.2 mg/dL (ref 8.9–10.3)
Chloride: 104 mmol/L (ref 101–111)
GFR calc non Af Amer: >60 mL/min (ref >60)
Glucose, Bld: 108 mg/dL — ABNORMAL HIGH (ref 65–99)
POTASSIUM: 3.6 mmol/L (ref 3.5–5.1)
SODIUM: 134 mmol/L — AB (ref 135–145)

## 2016-11-05 LAB — TROPONIN I: Troponin I: <0.03 ng/mL (ref <0.03)

## 2016-11-05 LAB — CBC
HCT: 38.8 % (ref 36.0–46.0)
Hemoglobin: 13.6 g/dL (ref 12.0–15.0)
MCH: 31.8 pg (ref 26.0–34.0)
MCHC: 35.1 g/dL (ref 30.0–36.0)
MCV: 90.7 fL (ref 78.0–100.0)
PLATELETS: 271 10*3/uL (ref 150–400)
RBC: 4.28 MIL/uL (ref 3.87–5.11)
RDW: 15.2 % (ref 11.5–15.5)
WBC: 6.2 10*3/uL (ref 4.0–10.5)

## 2016-11-05 LAB — I-STAT TROPONIN, ED: TROPONIN I, POC: 0.03 ng/mL (ref 0.00–0.08)

## 2016-11-05 MED ORDER — CLONAZEPAM 1 MG PO TABS: 1.0000 mg | ORAL_TABLET | Freq: Every evening | ORAL | 0 refills | Status: DC | PRN

## 2016-11-05 NOTE — ED Provider Notes (Signed)
[facility]-EMERGENCY DEPT Provider Note   CSN: [id number] Arrival date & time: 11/05/16  1430   History   Chief Complaint Chief Complaint  Patient presents with  . Irregular Heart Beat    HPI Cassie Harrell is a 66 y.o. female.  HPI   66 year old female presents today with complaints of palpitations. Patient has approximately 3 days ago she started to have palpitations, worse at night when laying down. She notes that her heart will beat fast with extra beats. She notes that this comes and goes every several minutes, sometimes associated with chest discomfort. Patient notes a significant history of the same, no she's followed with cardiology and was placed on metoprolol for this. Patient notes that the chest pain feels like pressure, notes that she is not currently have any chest pain. Patient notes some nausea, no vomiting, abdominal discomfort and indigestion. She has history of GERD, reports this does not feel similar. Patient notes she has been more stressed than usual, but denies any drug or alcohol use. Patient does not have a history of DVT or PE, denies any lower extremity swelling or edema, or any other significant risk factors for pulmonary embolism.    Past Medical History:  Diagnosis Date  . Bipolar 1 disorder (HCC)   . Cocaine abuse   . COPD (chronic obstructive pulmonary disease) (HCC)   . Dissociative disorder   . GERD (gastroesophageal reflux disease)   . Glaucoma   . Hypertension   . Neuropathy of foot    bilateral  . Osteoarthritis   . PTSD (post-traumatic stress disorder)   . PVD (peripheral vascular disease) (HCC) 2017  . Sleep apnea     Patient Active Problem List   Diagnosis Date Noted  . Alcohol abuse 07/31/2016  . Bipolar disorder with depression (HCC) 06/28/2016  . Chest pain 06/09/2014  . CAP (community acquired pneumonia) 06/09/2014  . COPD (chronic obstructive pulmonary disease) (HCC)   . Fibromyalgia   . Bipolar 1 disorder (HCC)   . GERD  (gastroesophageal reflux disease)     Past Surgical History:  Procedure Laterality Date  . ABDOMINAL HYSTERECTOMY    . APPENDECTOMY    . CESAREAN SECTION    . EYE SURGERY    . FINGER SURGERY    . REVISION OF SCAR TISSUE RECTUS MUSCLE    . UVULOPALATOPHARYNGOPLASTY      OB History    Gravida Para Term Preterm AB Living   1             SAB TAB Ectopic Multiple Live Births                   Home Medications    Prior to Admission medications   Medication Sig Start Date End Date Taking? Authorizing Provider  albuterol (PROVENTIL HFA;VENTOLIN HFA) 108 (90 BASE) MCG/ACT inhaler Inhale 2 puffs into the lungs every 6 (six) hours as needed for wheezing or shortness of breath.    Yes Historical Provider, MD  aspirin 81 MG EC tablet Take 81 mg by mouth daily. Swallow whole.   Yes Historical Provider, MD  brimonidine-timolol (COMBIGAN) 0.2-0.5 % ophthalmic solution Place 1 drop into both eyes every 12 (twelve) hours.    Yes Historical Provider, MD  ibuprofen (ADVIL,MOTRIN) 800 MG tablet Take 1 tablet (800 mg total) by mouth 3 (three) times daily. Patient taking differently: Take 800 mg by mouth every 8 (eight) hours as needed for moderate pain.  08/20/16  Yes Dione Boozeavid Glick, MD  latanoprost (  XALATAN) 0.005 % ophthalmic solution Place 1 drop into both eyes at bedtime.    Yes Historical Provider, MD  linaclotide (LINZESS) 145 MCG CAPS capsule Take 145 mcg by mouth daily as needed (ibs/ constipation).    Yes Historical Provider, MD  lisinopril-hydrochlorothiazide (PRINZIDE,ZESTORETIC) 20-12.5 MG per tablet Take 1 tablet by mouth daily.   Yes Historical Provider, MD  metoprolol succinate (TOPROL-XL) 50 MG 24 hr tablet Take 50 mg by mouth daily. Take with or immediately following a meal.    Yes Historical Provider, MD  Omega-3 Fatty Acids (FISH OIL) 1200 MG CAPS Take 1,200 mg by mouth daily.   Yes Historical Provider, MD  omeprazole (PRILOSEC) 20 MG capsule Take 40 mg by mouth daily.    Yes Historical  Provider, MD  tiZANidine (ZANAFLEX) 4 MG capsule Take 1 capsule (4 mg total) by mouth 3 (three) times daily. Patient taking differently: Take 4 mg by mouth 2 (two) times daily as needed for muscle spasms.  02/23/15  Yes Arby Barrette, MD  traZODone (DESYREL) 150 MG tablet Take 150 mg by mouth at bedtime.   Yes Historical Provider, MD  umeclidinium bromide (INCRUSE ELLIPTA) 62.5 MCG/INH AEPB Inhale 1 puff into the lungs daily.   Yes Historical Provider, MD  venlafaxine (EFFEXOR) 75 MG tablet Take 75 mg by mouth daily.   Yes Historical Provider, MD  Vitamin D, Ergocalciferol, (DRISDOL) 50000 units CAPS capsule Take 50,000 Units by mouth every Monday.  06/16/16  Yes Historical Provider, MD  clonazePAM (KLONOPIN) 1 MG tablet Take 1 tablet (1 mg total) by mouth at bedtime as needed for anxiety. 11/05/16   Eyvonne Mechanic, PA-C  HYDROcodone-acetaminophen (NORCO) 5-325 MG tablet Take 1 tablet by mouth every 6 (six) hours as needed for moderate pain. Patient not taking: Reported on 11/05/2016 09/17/16   Mancel Bale, MD  oxyCODONE-acetaminophen (PERCOCET) 7.5-325 MG tablet Take 1 tablet by mouth every 4 (four) hours as needed for severe pain. Patient not taking: Reported on 09/22/2016 08/20/16   Dione Booze, MD    Family History Family History  Problem Relation Age of Onset  . Other Mother     natural  . Heart attack Father   . Other Sister     kidney transplant  . Alcoholism Brother   . Goiter Sister   . Diabetes Brother   . Diabetes Sister     Social History Social History  Substance Use Topics  . Smoking status: Current Every Day Smoker    Packs/day: 0.50    Years: 20.00    Types: Cigarettes  . Smokeless tobacco: Never Used  . Alcohol use Yes     Comment: 1-2 cans of beer weekly     Allergies   Seroquel [quetiapine]; Codeine; Diclofenac; Flagyl [metronidazole]; Imipramine; Meloxicam; Metoprolol tartrate; Olanzapine; Other; and Vistaril [hydroxyzine hcl]   Review of Systems Review of  Systems  All other systems reviewed and are negative.  Physical Exam Updated Vital Signs BP (!) 127/107   Pulse 77   Temp 98.3 F (36.8 C) (Oral)   Resp 18   Wt 65.3 kg   SpO2 100%   BMI 25.51 kg/m   Physical Exam  Constitutional: She is oriented to person, place, and time. She appears well-developed and well-nourished.  HENT:  Head: Normocephalic and atraumatic.  Eyes: Conjunctivae are normal. Pupils are equal, round, and reactive to light. Right eye exhibits no discharge. Left eye exhibits no discharge. No scleral icterus.  Neck: Normal range of motion. No JVD present. No  tracheal deviation present.  Cardiovascular: Normal rate, regular rhythm, normal heart sounds and intact distal pulses.  Exam reveals no gallop and no friction rub.   No murmur heard. Pulmonary/Chest: Effort normal and breath sounds normal. No stridor. No respiratory distress. She has no wheezes. She has no rales. She exhibits no tenderness.  Musculoskeletal: She exhibits no edema.  Neurological: She is alert and oriented to person, place, and time. Coordination normal.  Psychiatric: She has a normal mood and affect. Her behavior is normal. Judgment and thought content normal.  Nursing note and vitals reviewed.   ED Treatments / Results  Labs (all labs ordered are listed, but only abnormal results are displayed) Labs Reviewed  BASIC METABOLIC PANEL - Abnormal; Notable for the following:       Result Value   Sodium 134 (*)    Glucose, Bld 108 (*)    All other components within normal limits  CBC  TROPONIN I  I-STAT TROPOININ, ED    EKG  EKG Interpretation  Date/Time:  Sunday November 05 2016 14:36:07 EST Ventricular Rate:  84 PR Interval:  132 QRS Duration: 108 QT Interval:  382 QTC Calculation: 451 R Axis:   -7 Text Interpretation:  Normal sinus rhythm Left ventricular hypertrophy ST elevation, consider early repolarization, pericarditis, or injury Abnormal ECG agree.  elevation I and aVl more  pronounced. Confirmed by Donnald Garre, MD, Lebron Conners [phone]) on 11/05/2016 2:46:32 PM       Radiology Dg Chest Port 1 View  Result Date: 11/05/2016 CLINICAL DATA:  Shortness of breath, chest pain, and palpitations for 3 days. COPD. EXAM: PORTABLE CHEST 1 VIEW COMPARISON:  08/20/2016 FINDINGS: The heart size and mediastinal contours are within normal limits. Both lungs are clear. The visualized skeletal structures are unremarkable. IMPRESSION: No active disease. Electronically Signed   By: Myles Rosenthal M.D.   On: 11/05/2016 16:21    Procedures Procedures (including critical care time)  Medications Ordered in ED Medications - No data to display   Initial Impression / Assessment and Plan / ED Course  I have reviewed the triage vital signs and the nursing notes.  Pertinent labs & imaging results that were available during my care of the patient were reviewed by me and considered in my medical decision making (see chart for details).    Final Clinical Impressions(s) / ED Diagnoses   Final diagnoses:  Palpitations    Labs: I stat trop, BMP, CBC  Imaging: DG chest 2 view, ED EKG  Consults:   Therapeutics:   Discharge Meds:   Assessment/Plan:  66 year old female presents today with complaints of palpitations and rapid heart rate. Patient noted she was diagnosed with SVT in the past and placed on metoprolol. She notes similar symptoms including rapid heart rate up to 100, and what she feels like extra heartbeats. Patient likely experiencing PVCs. She has no pain throughout my evaluation, as no signs of SVT or acute concerning changes on her EKG. Patient's initial troponin was negative, she has reassuring laboratory analysis. I have very low suspicion for ACS in this patient she has a low heart score of 3. Patient reports some anxiety at night sleeping uncertain if this is causing worsening symptoms. Patient will have repeat troponin after 3 hours, if this is negative she'll be discharged home  with reevaluation by her cardiologist. She'll be given a short course of clonazepam for sleep. Patient has no signs or symptoms consistent with PE.   Pt given strict return precautions, she verbalized understanding and  agreement to today's plan and had no further questions or concerns at the time of discharge    New Prescriptions New Prescriptions   CLONAZEPAM (KLONOPIN) 1 MG TABLET    Take 1 tablet (1 mg total) by mouth at bedtime as needed for anxiety.     Eyvonne Mechanic, PA-C 11/05/16 2003    Gwyneth Sprout, MD 11/05/16 2135

## 2016-11-05 NOTE — ED Notes (Signed)
Taxi voucher given 

## 2016-11-05 NOTE — Discharge Instructions (Signed)
Please read attached information. If you experience any new or worsening signs or symptoms please return to the emergency room for evaluation. Please follow-up with your primary care provider or specialist as discussed. Please use medication prescribed only as directed and discontinue taking if you have any concerning signs or symptoms.   °

## 2016-11-05 NOTE — ED Triage Notes (Signed)
Pt,. Is from home and developed sob, weakness and heart palpatiations for 2 3 days.   She is having chest pain.  Skinb is warm and dry, ECG completed in triage.  Pt. Reports having V-fib. EKG en route show SR, with PAC's And PVC's

## 2016-11-05 NOTE — ED Notes (Signed)
Patient given Bag meal. 

## 2016-11-05 NOTE — ED Notes (Signed)
Pt reporting she does not have any way home.  No family in town.  Called Woody,RN charge nurse who authorized cab voucher.

## 2016-11-06 ENCOUNTER — Ambulatory Visit: Payer: Medicare Other | Admitting: Physical Therapy

## 2016-11-06 ENCOUNTER — Encounter: Payer: Self-pay | Admitting: Physical Therapy

## 2016-11-06 DIAGNOSIS — M542 Cervicalgia: Secondary | ICD-10-CM | POA: Diagnosis present

## 2016-11-06 DIAGNOSIS — M5442 Lumbago with sciatica, left side: Secondary | ICD-10-CM | POA: Diagnosis present

## 2016-11-06 DIAGNOSIS — R262 Difficulty in walking, not elsewhere classified: Secondary | ICD-10-CM | POA: Diagnosis present

## 2016-11-06 DIAGNOSIS — M62830 Muscle spasm of back: Secondary | ICD-10-CM

## 2016-11-06 NOTE — Therapy (Signed)
[facility] [address] [place], [place], [address] Phone: [phone]   Fax:  [phone]  Physical Therapy Evaluation  Patient Details  Name: Cassie Harrell MRN: [id number] Date of Birth: Aug 23, 1951 Referring Provider: Annell Greening  Encounter Date: 11/06/2016      PT End of Session - 11/06/16 1319    Visit Number 1   Date for PT Re-Evaluation 01/04/17   PT Start Time 1250   PT Stop Time 1350   PT Time Calculation (min) 60 min   Activity Tolerance Patient tolerated treatment well   Behavior During Therapy [facility] for tasks assessed/performed      Past Medical History:  Diagnosis Date  . Bipolar 1 disorder (HCC)   . Cocaine abuse   . COPD (chronic obstructive pulmonary disease) (HCC)   . Dissociative disorder   . GERD (gastroesophageal reflux disease)   . Glaucoma   . Hypertension   . Neuropathy of foot    bilateral  . Osteoarthritis   . PTSD (post-traumatic stress disorder)   . PVD (peripheral vascular disease) (HCC) 2017  . Sleep apnea     Past Surgical History:  Procedure Laterality Date  . ABDOMINAL HYSTERECTOMY    . APPENDECTOMY    . CESAREAN SECTION    . EYE SURGERY    . FINGER SURGERY    . REVISION OF SCAR TISSUE RECTUS MUSCLE    . UVULOPALATOPHARYNGOPLASTY      There were no vitals filed for this visit.       Subjective Assessment - 11/06/16 1257    Subjective Patient reports that she has had increased back and neck pain over the past 8 months, she reports that she has had pain off and on for a number of years.  She has MRI that shows degenerative changes and some bulging discs   Limitations Sitting;Lifting;Standing;Walking;House hold activities   Patient Stated Goals have less pain   Currently in Pain? Yes   Pain Score 6    Pain Location Back  neck and left buttock   Pain Orientation Left;Lower   Pain Descriptors / Indicators Aching;Sore;Sharp;Spasm   Pain Type Chronic pain    Pain Onset More than a month ago   Pain Frequency Constant   Aggravating Factors  sitting, bending any ADL's can be up to 10/10   Pain Relieving Factors rest and ibuprofen will decrease the pain to 6/10   Effect of Pain on Daily Activities limits everything, can't really walk at times            [facility] PT Assessment - 11/06/16 0001      Assessment   Medical Diagnosis back and neck pain   Referring Provider Annell Greening   Onset Date/Surgical Date 09/22/16   Hand Dominance Left   Prior Therapy no     Precautions   Precautions None     Balance Screen   Has the patient fallen in the past 6 months No   Has the patient had a decrease in activity level because of a fear of falling?  No   Is the patient reluctant to leave their home because of a fear of falling?  No     Home Environment   Additional Comments has stairs, does some light housework     Prior Function   Level of Independence Independent   Vocation On disability   Leisure no exercise     Posture/Postural Control   Posture Comments very gaurded, slouched  posture     ROM / Strength   AROM / PROM / Strength AROM;Strength     AROM   Overall AROM Comments cervical ROM was decreased 50%, lumbar ROM was decreased 75% all motions are gaurded and painful     Strength   Overall Strength Comments 3+/5 for the UE and LE's due to pain     Flexibility   Soft Tissue Assessment /Muscle Length --  very gaurded and painful, unable to assess     Palpation   Palpation comment very painful/tender to palpation all over the neck, back, left buttock and left upper trap     Ambulation/Gait   Gait Comments patient needs help to walk, HHA, left leg gives out and she has to hold onto the walls to steady herself, could walk 30 feet and then needed to sit                   [facility] Adult PT Treatment/Exercise - 11/06/16 0001      Modalities   Modalities Electrical Stimulation;Iontophoresis;Moist Heat     Moist Heat Therapy    Number Minutes Moist Heat 15 Minutes   Moist Heat Location Cervical;Lumbar Spine     Electrical Stimulation   Electrical Stimulation Location left buttock, left cervical area   Electrical Stimulation Action premod   Electrical Stimulation Parameters supine   Electrical Stimulation Goals Pain     Iontophoresis   Type of Iontophoresis Dexamethasone   Location left SI area   Dose 80mA   Time 4 hour patch                PT Education - 11/06/16 1319    Education provided Yes   Education Details wms flexion and shoulder shrugs and scapular retractions   Person(s) Educated Patient   Methods Explanation;Demonstration;Handout   Comprehension Verbalized understanding          PT Short Term Goals - 11/06/16 1324      PT SHORT TERM GOAL #1   Title independent iwth initial HEP   Time 2   Period Weeks   Status New           PT Long Term Goals - 11/06/16 1325      PT LONG TERM GOAL #1   Title understand proper posture and body mechanics   Time 8   Period Weeks   Status New     PT LONG TERM GOAL #2   Title decrease pain 50%   Time 8   Period Weeks   Status New     PT LONG TERM GOAL #3   Title increase RM 25%   Time 8   Period Weeks   Status New     PT LONG TERM GOAL #4   Title walk without device 300 feet   Time 8   Period Weeks   Status New               Plan - 11/06/16 1320    Clinical Impression Statement Patient with significant c/o pain in the back, neck and left hip area, she was having a great deal of walking, needing assist to walk 30 feet and then needing to sit and rest.  Her cervical ROM was decreased 50%, her lumbar ROM was decreased 75%.   Rehab Potential Fair   PT Frequency 2x / week   PT Duration 8 weeks   PT Treatment/Interventions ADLs/Self Care Home Management;Cryotherapy;Electrical Stimulation;Iontophoresis 4mg /ml Dexamethasone;Moist Heat;Traction;Ultrasound;Therapeutic activities;Therapeutic exercise;Patient/family  education;Manual techniques  PT Next Visit Plan slowly try to get her moving   Consulted and Agree with Plan of Care Patient      Patient will benefit from skilled therapeutic intervention in order to improve the following deficits and impairments:  Abnormal gait, Decreased activity tolerance, Decreased mobility, Decreased range of motion, Decreased strength, Difficulty walking, Increased muscle spasms, Impaired flexibility, Postural dysfunction, Improper body mechanics, Pain  Visit Diagnosis: Acute bilateral low back pain with left-sided sciatica - Plan: PT plan of care cert/re-cert  Muscle spasm of back - Plan: PT plan of care cert/re-cert  Difficulty in walking, not elsewhere classified - Plan: PT plan of care cert/re-cert  Cervicalgia - Plan: PT plan of care cert/re-cert      G-Codes - 12/04/16 1327    Functional Assessment Tool Used foto 78% limitation   Functional Limitation Carrying, moving and handling objects   Carrying, Moving and Handling Objects Current Status (Z6109) At least 60 percent but less than 80 percent impaired, limited or restricted   Carrying, Moving and Handling Objects Goal Status (U0454) At least 40 percent but less than 60 percent impaired, limited or restricted       Problem List Patient Active Problem List   Diagnosis Date Noted  . Alcohol abuse 07/31/2016  . Bipolar disorder with depression (HCC) 06/28/2016  . Chest pain 06/09/2014  . CAP (community acquired pneumonia) 06/09/2014  . COPD (chronic obstructive pulmonary disease) (HCC)   . Fibromyalgia   . Bipolar 1 disorder (HCC)   . GERD (gastroesophageal reflux disease)     Jearld Lesch., PT December 04, 2016, 1:29 PM  [facility] [address] [place], [place], [address] Phone: [phone]   Fax:  [phone]  Name: Kerina Simoneau MRN: [id number] Date of Birth: [birth year]

## 2016-11-07 ENCOUNTER — Emergency Department (HOSPITAL_COMMUNITY)
Admission: EM | Admit: 2016-11-07 | Discharge: 2016-11-07 | Disposition: A | Discharge status: Home / Self Care | Payer: Medicare Other | Attending: Dermatology | Admitting: Dermatology

## 2016-11-07 DIAGNOSIS — Z7982 Long term (current) use of aspirin: Secondary | ICD-10-CM | POA: Insufficient documentation

## 2016-11-07 DIAGNOSIS — J449 Chronic obstructive pulmonary disease, unspecified: Secondary | ICD-10-CM | POA: Diagnosis not present

## 2016-11-07 DIAGNOSIS — F17210 Nicotine dependence, cigarettes, uncomplicated: Secondary | ICD-10-CM | POA: Diagnosis not present

## 2016-11-07 DIAGNOSIS — Z5321 Procedure and treatment not carried out due to patient leaving prior to being seen by health care provider: Secondary | ICD-10-CM | POA: Diagnosis not present

## 2016-11-07 DIAGNOSIS — I10 Essential (primary) hypertension: Secondary | ICD-10-CM | POA: Diagnosis not present

## 2016-11-07 DIAGNOSIS — M549 Dorsalgia, unspecified: Secondary | ICD-10-CM | POA: Insufficient documentation

## 2016-11-07 NOTE — ED Triage Notes (Signed)
Back pain and hip pain hurts to move since sunday

## 2016-11-07 NOTE — ED Notes (Signed)
Pt left due to wait. Pt stated that she was going to go to a urgent care to be seen instead because she is tired of sitting here. I insisted that pt should stay. Pt has walked out at this time.

## 2016-11-14 ENCOUNTER — Ambulatory Visit (INDEPENDENT_AMBULATORY_CARE_PROVIDER_SITE_OTHER): Payer: Medicare Other | Admitting: Orthopaedic Surgery

## 2016-11-14 ENCOUNTER — Encounter (INDEPENDENT_AMBULATORY_CARE_PROVIDER_SITE_OTHER): Payer: Self-pay | Admitting: Orthopaedic Surgery

## 2016-11-14 VITALS — BP 162/97 | HR 73 | Ht 63.0 in | Wt 144.0 lb

## 2016-11-14 DIAGNOSIS — M545 Low back pain, unspecified: Secondary | ICD-10-CM

## 2016-11-14 DIAGNOSIS — M542 Cervicalgia: Secondary | ICD-10-CM

## 2016-11-14 NOTE — Progress Notes (Signed)
Office Visit Note   Patient: Cassie Harrell           Date of Birth: 02-19-1951           MRN: [id number] Visit Date: 11/14/2016              Requested by: Fleet Contras, MD [address] [place], [place] [address] PCP: Dorrene German, MD   Assessment & Plan: Visit Diagnoses:  1. Left-sided low back pain without sciatica, unspecified chronicity   2. Cervicalgia     Plan: Patient's only been to physical therapy once she is called and needs to resume outlined recommended treatment. Whether was a problem previously for her with transportation. She states she has a nurse that comes out and helps her at her house as well as a CNA who comes and does things more difficult for her to do with bending etc. She has some spondylosis in the cervical spine with mild narrowing. She is also had an MRI scan shows mild left side L4-5 disc protrusion. I discussed with her in detail this point she does not need surgical intervention. She'll continue with therapy as originally ordered and I'll recheck her in 6 weeks. She asked about multiple different types of narcotics either through pills or patches etc. I discussed that this is not a good idea for her. We buddy taped small finger and ring finger she can work on flexibility of the PIP joint at home taping her fingers together. I discussed with her in detail that I do not think narcotic administration for her initial uses occasionally has a good idea. Narcotic requests were denied. Follow-Up Instructions: Return in about 6 weeks (around 12/26/2016).   Orders:  No orders of the defined types were placed in this encounter.  No orders of the defined types were placed in this encounter.     Procedures: No procedures performed   Clinical Data: No additional findings.   Subjective: Chief Complaint  Patient presents with  . Lower Back - Follow-up  . Neck - Follow-up    Patient returns for one month follow up low back and neck. She has only  attended one physical therapy appointment.  She states that the heat they used felt good for a while but she continued to get worse afterwards. She had a flare up in which she states she could not get comfortable and could not walk to the bathroom. She has been taking Ibuprofen 800mg  every 3-4 hours. The pain is in the left side and left buttocks. She states it "feels like someone shot me in the butt". During the last flare up, she felt the pain was so severe that she almost passed out. She called her home health nurse who came out and brought her a walker. She is also getting a bedside commode since she can't walk when it flares up so badly. She requests a stronger pain medicine for the days that the pain is so severe. Hydrocodone works for her. She has also had a recent trip to the ER for fluctuations in her heart since last visit.   Patient denies that she ever had an alcohol problem. She states she only used to drink a beer at night to help her sleep. She also denied problems with cocaine in the past. She requested a prescription for some Buprinporphine which she states a friend of hers takes and seemed to work good for back pain. Patient states her nurse had to bring the walker but recently  she is doing better and is able to ambulate today in the office without limping and with normal posture.  Review of Systems 14 point review of systems updated. Pertinent review of systems updated positive for back pain neck pain bipolar disorder, per chart list alcohol abuse but patient denies this. Positive drug screen for cocaine noted several years ago. Negative for associated bowel or bladder symptoms currently.  Objective: Vital Signs: BP (!) 162/97   Pulse 73   Ht 5\' 3"  (1.6 m)   Wt 144 lb (65.3 kg)   BMI 25.51 kg/m   Physical Exam  Constitutional: She is oriented to person, place, and time. She appears well-developed.  HENT:  Head: Normocephalic.  Right Ear: External ear normal.  Left Ear: External  ear normal.  Eyes: Pupils are equal, round, and reactive to light.  Neck: No tracheal deviation present. No thyromegaly present.  Cardiovascular: Normal rate.   Pulmonary/Chest: Effort normal.  Abdominal: Soft.  Musculoskeletal:  Patient's pain with the rotation of her neck minimal brachial plexus tenderness upper and lower extremity reflexes are 2+ and symmetrical no pain with hip range of motion knees revealed full extension. She can heel and toe walk normally anterior tib EHL is strong. No rash over exposed skin. No lower extremity clonus. No atrophy is noted. No synovitis of the wrist fingers. She states she injured her fifth finger and had surgery at the PIP joint several years ago and the small finger tends to bother her some. With attempts at passive flexion she resists. When she tries to flex the foot fifth finger she extends the other 3 close his fingers to prevent further flexion. She does have performed this testing that requires distraction in order to demonstrate profundus function at the DIP joint. Terminal extensor is intact. No extension lag of the baby appeared DIP joint. Collateral ligaments are stable. Healed scar over the radial side of the PIP joint.  Neurological: She is alert and oriented to person, place, and time.  Skin: Skin is warm and dry.  Psychiatric: She has a normal mood and affect. Her behavior is normal.    Ortho Exam  Specialty Comments:  No specialty comments available.  Imaging: No results found.   PMFS History: Patient Active Problem List   Diagnosis Date Noted  . Alcohol abuse 07/31/2016  . Bipolar disorder with depression (HCC) 06/28/2016  . Chest pain 06/09/2014  . CAP (community acquired pneumonia) 06/09/2014  . COPD (chronic obstructive pulmonary disease) (HCC)   . Fibromyalgia   . Bipolar 1 disorder (HCC)   . GERD (gastroesophageal reflux disease)    Past Medical History:  Diagnosis Date  . Bipolar 1 disorder (HCC)   . Cocaine abuse     . COPD (chronic obstructive pulmonary disease) (HCC)   . Dissociative disorder   . GERD (gastroesophageal reflux disease)   . Glaucoma   . Hypertension   . Neuropathy of foot    bilateral  . Osteoarthritis   . PTSD (post-traumatic stress disorder)   . PVD (peripheral vascular disease) (HCC) 2017  . Sleep apnea     Family History  Problem Relation Age of Onset  . Other Mother     natural  . Heart attack Father   . Other Sister     kidney transplant  . Alcoholism Brother   . Goiter Sister   . Diabetes Brother   . Diabetes Sister     Past Surgical History:  Procedure Laterality Date  . ABDOMINAL HYSTERECTOMY    .  APPENDECTOMY    . CESAREAN SECTION    . EYE SURGERY    . FINGER SURGERY    . REVISION OF SCAR TISSUE RECTUS MUSCLE    . UVULOPALATOPHARYNGOPLASTY     Social History   Occupational History  .  Other    disabled   Social History Main Topics  . Smoking status: Current Every Day Smoker    Packs/day: 0.50    Years: 20.00    Types: Cigarettes  . Smokeless tobacco: Never Used  . Alcohol use Yes     Comment: 1-2 cans of beer weekly  . Drug use: Yes    Types: Cocaine     Comment: relapsed and used Cocaine on Friday 12/2014)  . Sexual activity: No

## 2016-11-16 ENCOUNTER — Ambulatory Visit: Payer: Medicare Other | Attending: Orthopaedic Surgery | Admitting: Physical Therapy

## 2016-11-28 ENCOUNTER — Ambulatory Visit: Payer: Medicare Other | Admitting: Physical Therapy

## 2016-12-25 ENCOUNTER — Emergency Department (HOSPITAL_COMMUNITY)
Admission: EM | Admit: 2016-12-25 | Discharge: 2016-12-25 | Disposition: A | Discharge status: Home / Self Care | Payer: Medicare Other | Attending: Emergency Medicine | Admitting: Emergency Medicine

## 2016-12-25 DIAGNOSIS — R1013 Epigastric pain: Secondary | ICD-10-CM | POA: Insufficient documentation

## 2016-12-25 DIAGNOSIS — I10 Essential (primary) hypertension: Secondary | ICD-10-CM | POA: Insufficient documentation

## 2016-12-25 DIAGNOSIS — J449 Chronic obstructive pulmonary disease, unspecified: Secondary | ICD-10-CM | POA: Insufficient documentation

## 2016-12-25 DIAGNOSIS — Z7982 Long term (current) use of aspirin: Secondary | ICD-10-CM | POA: Insufficient documentation

## 2016-12-25 DIAGNOSIS — R1012 Left upper quadrant pain: Secondary | ICD-10-CM | POA: Diagnosis present

## 2016-12-25 DIAGNOSIS — F17210 Nicotine dependence, cigarettes, uncomplicated: Secondary | ICD-10-CM | POA: Insufficient documentation

## 2016-12-25 MED ORDER — BISMUTH SUBSALICYLATE 262 MG/15ML PO SUSP: 30.0000 mL | ORAL | 0 refills | Status: DC | PRN

## 2016-12-25 MED ORDER — ACETAMINOPHEN 500 MG PO TABS
1000.0000 mg | ORAL_TABLET | Freq: Once | ORAL | Status: AC
  Administered 2016-12-25: 1000 mg via ORAL
  Filled 2016-12-25: qty 2

## 2016-12-25 MED ORDER — GI COCKTAIL ~~LOC~~
30.0000 mL | Freq: Once | ORAL | Status: AC
Start: 2016-12-25 — End: 2016-12-25
  Administered 2016-12-25: 30 mL via ORAL
  Filled 2016-12-25: qty 30

## 2016-12-25 MED ORDER — ONDANSETRON 4 MG PO TBDP: 4.0000 mg | ORAL_TABLET | Freq: Three times a day (TID) | ORAL | 0 refills | Status: DC | PRN

## 2016-12-25 NOTE — ED Notes (Signed)
Pt reports chest pain when taking deep breaths.

## 2016-12-25 NOTE — Progress Notes (Signed)
CSW was informed by RN pt refused a bus pass stating she did not have the money and that she had a fear of buses.  CSW informed pt due to her ability to ambulate she would have to take a bus and offered to guide her the bus stop.  Pt refused.  Pt stated she did have enough money to take a taxi.  [facility] called [employer] [employer] and walked the pt to the ED exit to await the taxi.  Pt was pleasant and appreciative of the CSW.  Dorothe PeaJonathan F. Thurmon Mizell, Theresia MajorsLCSWA, LCAS Clinical Social Worker Ph: [phone]

## 2016-12-25 NOTE — ED Notes (Signed)
Bed: WA09 Expected date:  Expected time:  Means of arrival:  Comments: 66 yo f luq abd pai, back pain

## 2016-12-25 NOTE — ED Triage Notes (Addendum)
Per EMS, pt is from home with complaints of a sudden onset of LUQ abdominal pain 10/10 that worsens with movement. Pt also complains of right flank pain. No recent injuries. Pt c/o n/v. Pt denies urinary symptoms. Pt received 4 mg of zofran.

## 2016-12-25 NOTE — ED Provider Notes (Signed)
[facility]-EMERGENCY DEPT Provider Note   CSN: [id number] Arrival date & time: 12/25/16  1735     History   Chief Complaint No chief complaint on file.   HPI Cassie Harrell is a 66 y.o. female.  The history is provided by the patient.  Abdominal Pain   This is a new problem. The current episode started 1 to 2 hours ago. The problem occurs constantly. The problem has not changed since onset.The pain is associated with eating. The pain is located in the LUQ and epigastric region. The quality of the pain is aching. The pain is mild. Pertinent negatives include anorexia, fever, nausea, vomiting and constipation. Nothing aggravates the symptoms. Nothing relieves the symptoms.    Past Medical History:  Diagnosis Date  . Bipolar 1 disorder (HCC)   . Cocaine abuse   . COPD (chronic obstructive pulmonary disease) (HCC)   . Dissociative disorder   . GERD (gastroesophageal reflux disease)   . Glaucoma   . Hypertension   . Neuropathy of foot    bilateral  . Osteoarthritis   . PTSD (post-traumatic stress disorder)   . PVD (peripheral vascular disease) (HCC) 2017  . Sleep apnea     Patient Active Problem List   Diagnosis Date Noted  . Alcohol abuse 07/31/2016  . Bipolar disorder with depression (HCC) 06/28/2016  . Chest pain 06/09/2014  . CAP (community acquired pneumonia) 06/09/2014  . COPD (chronic obstructive pulmonary disease) (HCC)   . Fibromyalgia   . Bipolar 1 disorder (HCC)   . GERD (gastroesophageal reflux disease)     Past Surgical History:  Procedure Laterality Date  . ABDOMINAL HYSTERECTOMY    . APPENDECTOMY    . CESAREAN SECTION    . EYE SURGERY    . FINGER SURGERY    . REVISION OF SCAR TISSUE RECTUS MUSCLE    . UVULOPALATOPHARYNGOPLASTY      OB History    Gravida Para Term Preterm AB Living   1             SAB TAB Ectopic Multiple Live Births                   Home Medications    Prior to Admission medications   Medication Sig Start Date End  Date Taking? Authorizing Provider  albuterol (PROVENTIL HFA;VENTOLIN HFA) 108 (90 BASE) MCG/ACT inhaler Inhale 2 puffs into the lungs every 6 (six) hours as needed for wheezing or shortness of breath.    Yes Historical Provider, MD  aspirin 81 MG EC tablet Take 81 mg by mouth daily. Swallow whole.   Yes Historical Provider, MD  brimonidine-timolol (COMBIGAN) 0.2-0.5 % ophthalmic solution Place 1 drop into both eyes every 12 (twelve) hours.    Yes Historical Provider, MD  desvenlafaxine (PRISTIQ) 50 MG 24 hr tablet Take 50 mg by mouth daily.  12/14/16  Yes Historical Provider, MD  latanoprost (XALATAN) 0.005 % ophthalmic solution Place 1 drop into both eyes at bedtime.    Yes Historical Provider, MD  LATUDA 40 MG TABS tablet Take 40 mg by mouth at bedtime.  12/14/16  Yes Historical Provider, MD  linaclotide (LINZESS) 145 MCG CAPS capsule Take 145 mcg by mouth daily as needed (ibs/ constipation).    Yes Historical Provider, MD  lisinopril-hydrochlorothiazide (PRINZIDE,ZESTORETIC) 20-12.5 MG per tablet Take 1 tablet by mouth daily.   Yes Historical Provider, MD  metoprolol succinate (TOPROL-XL) 50 MG 24 hr tablet Take 50 mg by mouth daily. Take with or immediately  following a meal.    Yes Historical Provider, MD  Omega-3 Fatty Acids (FISH OIL) 1200 MG CAPS Take 1,200 mg by mouth daily.   Yes Historical Provider, MD  omeprazole (PRILOSEC) 20 MG capsule Take 40 mg by mouth daily.    Yes Historical Provider, MD  tiZANidine (ZANAFLEX) 4 MG tablet Take 4 mg by mouth every 8 (eight) hours as needed for muscle spasms.  12/21/16  Yes Historical Provider, MD  traMADol (ULTRAM) 50 MG tablet Take 50 mg by mouth every 6 (six) hours as needed for moderate pain or severe pain.  11/22/16  Yes Historical Provider, MD  umeclidinium bromide (INCRUSE ELLIPTA) 62.5 MCG/INH AEPB Inhale 1 puff into the lungs daily.   Yes Historical Provider, MD  Vitamin D, Ergocalciferol, (DRISDOL) 50000 units CAPS capsule Take 50,000 Units by mouth  every Monday.  06/16/16  Yes Historical Provider, MD  clonazePAM (KLONOPIN) 1 MG tablet Take 1 tablet (1 mg total) by mouth at bedtime as needed for anxiety. Patient not taking: Reported on 11/14/2016 11/05/16   Eyvonne Mechanic, PA-C  HYDROcodone-acetaminophen (NORCO) 5-325 MG tablet Take 1 tablet by mouth every 6 (six) hours as needed for moderate pain. Patient not taking: Reported on 11/14/2016 09/17/16   Mancel Bale, MD  ibuprofen (ADVIL,MOTRIN) 800 MG tablet Take 1 tablet (800 mg total) by mouth 3 (three) times daily. Patient not taking: Reported on 12/25/2016 08/20/16   Dione Booze, MD  oxyCODONE-acetaminophen (PERCOCET) 7.5-325 MG tablet Take 1 tablet by mouth every 4 (four) hours as needed for severe pain. Patient not taking: Reported on 11/14/2016 08/20/16   Dione Booze, MD  tiZANidine (ZANAFLEX) 4 MG capsule Take 1 capsule (4 mg total) by mouth 3 (three) times daily. Patient not taking: Reported on 12/25/2016 02/23/15   Arby Barrette, MD    Family History Family History  Problem Relation Age of Onset  . Other Mother     natural  . Heart attack Father   . Other Sister     kidney transplant  . Alcoholism Brother   . Goiter Sister   . Diabetes Brother   . Diabetes Sister     Social History Social History  Substance Use Topics  . Smoking status: Current Every Day Smoker    Packs/day: 0.50    Years: 20.00    Types: Cigarettes  . Smokeless tobacco: Never Used  . Alcohol use Yes     Comment: 1-2 cans of beer weekly     Allergies   Seroquel [quetiapine]; Codeine; Diclofenac; Flagyl [metronidazole]; Imipramine; Meloxicam; Metoprolol tartrate; Olanzapine; Other; and Vistaril [hydroxyzine hcl]   Review of Systems Review of Systems  Constitutional: Negative for fever.  Gastrointestinal: Positive for abdominal pain. Negative for anorexia, constipation, nausea and vomiting.  All other systems reviewed and are negative.    Physical Exam Updated Vital Signs BP 111/75 (BP  Location: Right Arm)   Pulse 63   Resp 18   SpO2 95%   Physical Exam  Constitutional: She is oriented to person, place, and time. She appears well-developed and well-nourished. No distress.  HENT:  Head: Normocephalic.  Nose: Nose normal.  Eyes: Conjunctivae are normal.  Neck: Neck supple. No spinous process tenderness and no muscular tenderness present. No neck rigidity. No tracheal deviation and normal range of motion present.  Cardiovascular: Normal rate, regular rhythm and normal heart sounds.   Pulmonary/Chest: Effort normal and breath sounds normal. No respiratory distress.  Abdominal: Soft. She exhibits no distension. There is no tenderness. There is  no rebound and no guarding.  Neurological: She is alert and oriented to person, place, and time.  Skin: Skin is warm and dry.  Psychiatric: She has a normal mood and affect.     ED Treatments / Results  Labs (all labs ordered are listed, but only abnormal results are displayed) Labs Reviewed - No data to display  EKG  EKG Interpretation None       Radiology No results found.  Procedures Procedures (including critical care time)  Medications Ordered in ED Medications  gi cocktail (Maalox,Lidocaine,Donnatal) (30 mLs Oral Given 12/25/16 1835)  acetaminophen (TYLENOL) tablet 1,000 mg (1,000 mg Oral Given 12/25/16 1835)     Initial Impression / Assessment and Plan / ED Course  I have reviewed the triage vital signs and the nursing notes.  Pertinent labs & imaging results that were available during my care of the patient were reviewed by me and considered in my medical decision making (see chart for details).     66 y.o. female presents with multiple complaints. Her main complaint is chronic pain of her back and neck. EMS placed her in a collar because she said she had history of cervical spondylosis. No injury, collar was cleared here. Her secondary complaint is mild LUQ abdominal discomfort that Is likely 2/2 mild  gastritis but is not c/w any surgical or medical emergency. Pt tell me she can't life her right leg but after discharge she is ambulating around department. Plan to follow up with PCP as needed and return precautions discussed for worsening or new concerning symptoms.   Final Clinical Impressions(s) / ED Diagnoses   Final diagnoses:  Dyspepsia    New Prescriptions Discharge Medication List as of 12/25/2016  6:42 PM    START taking these medications   Details  bismuth subsalicylate (PEPTO-BISMOL) 262 MG/15ML suspension Take 30 mLs by mouth every 4 (four) hours as needed for indigestion., Starting Mon 12/25/2016, Print    ondansetron (ZOFRAN ODT) 4 MG disintegrating tablet Take 1 tablet (4 mg total) by mouth every 8 (eight) hours as needed for nausea or vomiting., Starting Mon 12/25/2016, Print         Lyndal Pulleyaniel Maijor Hornig, MD 12/26/16 [phone]

## 2016-12-27 ENCOUNTER — Ambulatory Visit (INDEPENDENT_AMBULATORY_CARE_PROVIDER_SITE_OTHER): Payer: Medicare Other | Admitting: Orthopaedic Surgery

## 2017-02-07 ENCOUNTER — Encounter: Payer: Self-pay | Admitting: Psychiatry

## 2017-03-07 ENCOUNTER — Other Ambulatory Visit: Payer: Self-pay | Admitting: Internal Medicine

## 2017-03-07 DIAGNOSIS — Z1231 Encounter for screening mammogram for malignant neoplasm of breast: Secondary | ICD-10-CM

## 2017-04-16 ENCOUNTER — Ambulatory Visit
Admission: RE | Admit: 2017-04-16 | Discharge: 2017-04-16 | Disposition: A | Discharge status: Home / Self Care | Payer: Medicare Other | Source: Ambulatory Visit | Attending: Internal Medicine | Admitting: Internal Medicine

## 2017-04-16 DIAGNOSIS — Z1231 Encounter for screening mammogram for malignant neoplasm of breast: Secondary | ICD-10-CM

## 2017-06-15 ENCOUNTER — Other Ambulatory Visit: Payer: Self-pay | Admitting: Neurosurgery

## 2017-06-19 ENCOUNTER — Encounter (HOSPITAL_COMMUNITY): Payer: Self-pay | Admitting: *Deleted

## 2017-06-19 NOTE — Progress Notes (Signed)
Spoke with pt for pre-op call. Pt states she has a hx of an irregular heart rate and takes Metoprolol and it keeps in under control. Pt denies any other cardiac history, chest pain or sob. Pt denies being diabetic. Pt instructed not to smoke as of now prior to surgery.

## 2017-06-20 ENCOUNTER — Ambulatory Visit (HOSPITAL_COMMUNITY): Payer: Medicare Other | Admitting: Certified Registered Nurse Anesthetist

## 2017-06-20 ENCOUNTER — Ambulatory Visit (HOSPITAL_COMMUNITY)
Admission: RE | Admit: 2017-06-20 | Discharge: 2017-06-21 | Disposition: A | Discharge status: Home / Self Care | Payer: Medicare Other | Source: Ambulatory Visit | Attending: Neurosurgery | Admitting: Neurosurgery

## 2017-06-20 ENCOUNTER — Encounter (HOSPITAL_COMMUNITY)
Admission: RE | Disposition: A | Discharge status: Home / Self Care | Payer: Self-pay | Source: Ambulatory Visit | Attending: Neurosurgery

## 2017-06-20 DIAGNOSIS — Z885 Allergy status to narcotic agent status: Secondary | ICD-10-CM | POA: Insufficient documentation

## 2017-06-20 DIAGNOSIS — Z888 Allergy status to other drugs, medicaments and biological substances status: Secondary | ICD-10-CM | POA: Insufficient documentation

## 2017-06-20 DIAGNOSIS — M1990 Unspecified osteoarthritis, unspecified site: Secondary | ICD-10-CM | POA: Diagnosis not present

## 2017-06-20 DIAGNOSIS — I252 Old myocardial infarction: Secondary | ICD-10-CM | POA: Insufficient documentation

## 2017-06-20 DIAGNOSIS — M40209 Unspecified kyphosis, site unspecified: Secondary | ICD-10-CM | POA: Insufficient documentation

## 2017-06-20 DIAGNOSIS — M5126 Other intervertebral disc displacement, lumbar region: Secondary | ICD-10-CM | POA: Diagnosis present

## 2017-06-20 DIAGNOSIS — I10 Essential (primary) hypertension: Secondary | ICD-10-CM | POA: Diagnosis not present

## 2017-06-20 DIAGNOSIS — Z90710 Acquired absence of both cervix and uterus: Secondary | ICD-10-CM | POA: Insufficient documentation

## 2017-06-20 DIAGNOSIS — Z79899 Other long term (current) drug therapy: Secondary | ICD-10-CM | POA: Insufficient documentation

## 2017-06-20 DIAGNOSIS — M797 Fibromyalgia: Secondary | ICD-10-CM | POA: Diagnosis not present

## 2017-06-20 DIAGNOSIS — K219 Gastro-esophageal reflux disease without esophagitis: Secondary | ICD-10-CM | POA: Insufficient documentation

## 2017-06-20 DIAGNOSIS — Z419 Encounter for procedure for purposes other than remedying health state, unspecified: Secondary | ICD-10-CM

## 2017-06-20 DIAGNOSIS — J449 Chronic obstructive pulmonary disease, unspecified: Secondary | ICD-10-CM | POA: Diagnosis not present

## 2017-06-20 DIAGNOSIS — F17210 Nicotine dependence, cigarettes, uncomplicated: Secondary | ICD-10-CM | POA: Insufficient documentation

## 2017-06-20 DIAGNOSIS — F319 Bipolar disorder, unspecified: Secondary | ICD-10-CM | POA: Diagnosis not present

## 2017-06-20 DIAGNOSIS — F419 Anxiety disorder, unspecified: Secondary | ICD-10-CM | POA: Diagnosis not present

## 2017-06-20 DIAGNOSIS — Z7982 Long term (current) use of aspirin: Secondary | ICD-10-CM | POA: Insufficient documentation

## 2017-06-20 DIAGNOSIS — Z9889 Other specified postprocedural states: Secondary | ICD-10-CM | POA: Insufficient documentation

## 2017-06-20 HISTORY — DX: Anxiety disorder, unspecified: F41.9

## 2017-06-20 HISTORY — PX: LUMBAR LAMINECTOMY/DECOMPRESSION MICRODISCECTOMY: SHX5026

## 2017-06-20 HISTORY — DX: Constipation, unspecified: K59.00

## 2017-06-20 HISTORY — DX: Cardiac arrhythmia, unspecified: I49.9

## 2017-06-20 LAB — COMPREHENSIVE METABOLIC PANEL
ALT: 14 U/L (ref 14–54)
ANION GAP: 10 (ref 5–15)
AST: 20 U/L (ref 15–41)
Albumin: 3.9 g/dL (ref 3.5–5.0)
Alkaline Phosphatase: 57 U/L (ref 38–126)
BUN: 9 mg/dL (ref 6–20)
CHLORIDE: 105 mmol/L (ref 101–111)
CO2: 25 mmol/L (ref 22–32)
Calcium: 9 mg/dL (ref 8.9–10.3)
Creatinine, Ser: 0.81 mg/dL (ref 0.44–1.00)
Glucose, Bld: 99 mg/dL (ref 65–99)
Potassium: 3.8 mmol/L (ref 3.5–5.1)
Sodium: 140 mmol/L (ref 135–145)
Total Bilirubin: 0.7 mg/dL (ref 0.3–1.2)
Total Protein: 6.8 g/dL (ref 6.5–8.1)

## 2017-06-20 LAB — CBC
HCT: 41.1 % (ref 36.0–46.0)
HEMOGLOBIN: 13.8 g/dL (ref 12.0–15.0)
MCH: 31.4 pg (ref 26.0–34.0)
MCHC: 33.6 g/dL (ref 30.0–36.0)
MCV: 93.4 fL (ref 78.0–100.0)
Platelets: 262 10*3/uL (ref 150–400)
RBC: 4.4 MIL/uL (ref 3.87–5.11)
RDW: 14.9 % (ref 11.5–15.5)
WBC: 5.7 10*3/uL (ref 4.0–10.5)

## 2017-06-20 SURGERY — LUMBAR LAMINECTOMY/DECOMPRESSION MICRODISCECTOMY 1 LEVEL
Anesthesia: General | Site: Back | Laterality: Left

## 2017-06-20 MED ORDER — VENLAFAXINE HCL ER 75 MG PO CP24
75.0000 mg | ORAL_CAPSULE | Freq: Every day | ORAL | Status: DC
Start: 2017-06-20 — End: 2017-06-21
  Administered 2017-06-20 – 2017-06-21 (×2): 75 mg via ORAL
  Filled 2017-06-20 (×2): qty 1

## 2017-06-20 MED ORDER — FENTANYL CITRATE (PF) 250 MCG/5ML IJ SOLN
INTRAMUSCULAR | Status: DC | PRN
  Administered 2017-06-20: 100 ug via INTRAVENOUS
  Administered 2017-06-20: 50 ug via INTRAVENOUS
  Administered 2017-06-20: 100 ug via INTRAVENOUS

## 2017-06-20 MED ORDER — PHENYLEPHRINE HCL 10 MG/ML IJ SOLN
INTRAMUSCULAR | Status: DC | PRN
  Administered 2017-06-20: 50 ug/min via INTRAVENOUS

## 2017-06-20 MED ORDER — UMECLIDINIUM BROMIDE 62.5 MCG/INH IN AEPB
1.0000 | INHALATION_SPRAY | Freq: Every day | RESPIRATORY_TRACT | Status: DC
  Administered 2017-06-21: 09:00:00 1 via RESPIRATORY_TRACT
  Filled 2017-06-20: qty 7

## 2017-06-20 MED ORDER — CELECOXIB 200 MG PO CAPS
200.0000 mg | ORAL_CAPSULE | Freq: Two times a day (BID) | ORAL | Status: DC
  Administered 2017-06-20 – 2017-06-21 (×2): 200 mg via ORAL
  Filled 2017-06-20 (×2): qty 1

## 2017-06-20 MED ORDER — OXYCODONE HCL 5 MG PO TABS
ORAL_TABLET | ORAL | Status: AC
  Filled 2017-06-20: qty 1

## 2017-06-20 MED ORDER — SODIUM CHLORIDE 0.9 % IV SOLN: 250.0000 mL | INTRAVENOUS | Status: DC

## 2017-06-20 MED ORDER — UMECLIDINIUM BROMIDE 62.5 MCG/INH IN AEPB
1.0000 | INHALATION_SPRAY | Freq: Every day | RESPIRATORY_TRACT | Status: DC
  Filled 2017-06-20: qty 7

## 2017-06-20 MED ORDER — ASPIRIN EC 81 MG PO TBEC
81.0000 mg | DELAYED_RELEASE_TABLET | Freq: Every day | ORAL | Status: DC
  Administered 2017-06-21: 81 mg via ORAL
  Filled 2017-06-20: qty 1

## 2017-06-20 MED ORDER — SUGAMMADEX SODIUM 200 MG/2ML IV SOLN
INTRAVENOUS | Status: DC | PRN
  Administered 2017-06-20: 200 mg via INTRAVENOUS

## 2017-06-20 MED ORDER — MIDAZOLAM HCL 2 MG/2ML IJ SOLN
INTRAMUSCULAR | Status: DC | PRN
  Administered 2017-06-20 (×2): 1 mg via INTRAVENOUS

## 2017-06-20 MED ORDER — SUGAMMADEX SODIUM 200 MG/2ML IV SOLN
INTRAVENOUS | Status: AC
  Filled 2017-06-20: qty 2

## 2017-06-20 MED ORDER — LACTATED RINGERS IV SOLN
INTRAVENOUS | Status: DC
  Administered 2017-06-20: 11:00:00 via INTRAVENOUS

## 2017-06-20 MED ORDER — ROCURONIUM BROMIDE 10 MG/ML (PF) SYRINGE
PREFILLED_SYRINGE | INTRAVENOUS | Status: AC
  Filled 2017-06-20: qty 5

## 2017-06-20 MED ORDER — SODIUM CHLORIDE 0.9% FLUSH
3.0000 mL | Freq: Two times a day (BID) | INTRAVENOUS | Status: DC
  Administered 2017-06-20: 3 mL via INTRAVENOUS

## 2017-06-20 MED ORDER — ONDANSETRON HCL 4 MG/2ML IJ SOLN
INTRAMUSCULAR | Status: DC | PRN
  Administered 2017-06-20: 4 mg via INTRAVENOUS

## 2017-06-20 MED ORDER — ZOLPIDEM TARTRATE 5 MG PO TABS: 5.0000 mg | ORAL_TABLET | Freq: Every evening | ORAL | Status: DC | PRN

## 2017-06-20 MED ORDER — PANTOPRAZOLE SODIUM 40 MG PO TBEC
40.0000 mg | DELAYED_RELEASE_TABLET | Freq: Every day | ORAL | Status: DC
  Administered 2017-06-20 – 2017-06-21 (×2): 40 mg via ORAL
  Filled 2017-06-20 (×2): qty 1

## 2017-06-20 MED ORDER — LACTATED RINGERS IV SOLN
INTRAVENOUS | Status: DC
  Administered 2017-06-20: 12:00:00 via INTRAVENOUS

## 2017-06-20 MED ORDER — PHENOL 1.4 % MT LIQD
1.0000 | OROMUCOSAL | Status: DC | PRN
Start: 2017-06-20 — End: 2017-06-21

## 2017-06-20 MED ORDER — ACETAMINOPHEN 650 MG RE SUPP: 650.0000 mg | RECTAL | Status: DC | PRN

## 2017-06-20 MED ORDER — LIDOCAINE-EPINEPHRINE 0.5 %-1:200000 IJ SOLN
INTRAMUSCULAR | Status: AC
  Filled 2017-06-20: qty 1

## 2017-06-20 MED ORDER — ONDANSETRON 4 MG PO TBDP
4.0000 mg | ORAL_TABLET | Freq: Three times a day (TID) | ORAL | Status: DC | PRN
  Filled 2017-06-20: qty 1

## 2017-06-20 MED ORDER — ARTIFICIAL TEARS OPHTHALMIC OINT
TOPICAL_OINTMENT | OPHTHALMIC | Status: DC | PRN
  Administered 2017-06-20: 1 via OPHTHALMIC

## 2017-06-20 MED ORDER — THROMBIN 5000 UNITS EX SOLR
OROMUCOSAL | Status: DC | PRN
  Administered 2017-06-20: 14:00:00 via TOPICAL

## 2017-06-20 MED ORDER — CEFAZOLIN SODIUM-DEXTROSE 2-4 GM/100ML-% IV SOLN
2.0000 g | INTRAVENOUS | Status: AC
  Administered 2017-06-20: 2 g via INTRAVENOUS

## 2017-06-20 MED ORDER — CHLORHEXIDINE GLUCONATE CLOTH 2 % EX PADS: 6.0000 | MEDICATED_PAD | Freq: Once | CUTANEOUS | Status: DC

## 2017-06-20 MED ORDER — LINACLOTIDE 145 MCG PO CAPS
145.0000 ug | ORAL_CAPSULE | Freq: Every day | ORAL | Status: DC | PRN
  Filled 2017-06-20: qty 1

## 2017-06-20 MED ORDER — GLYCOPYRROLATE 0.2 MG/ML IJ SOLN
INTRAMUSCULAR | Status: DC | PRN
  Administered 2017-06-20: 0.2 mg via INTRAVENOUS

## 2017-06-20 MED ORDER — MIDAZOLAM HCL 2 MG/2ML IJ SOLN
INTRAMUSCULAR | Status: AC
  Filled 2017-06-20: qty 2

## 2017-06-20 MED ORDER — LIDOCAINE HCL (CARDIAC) 20 MG/ML IV SOLN
INTRAVENOUS | Status: DC | PRN
  Administered 2017-06-20: 100 mg via INTRATRACHEAL

## 2017-06-20 MED ORDER — BRIMONIDINE TARTRATE-TIMOLOL 0.2-0.5 % OP SOLN: 1.0000 [drp] | Freq: Two times a day (BID) | OPHTHALMIC | Status: DC

## 2017-06-20 MED ORDER — FENTANYL CITRATE (PF) 100 MCG/2ML IJ SOLN
INTRAMUSCULAR | Status: AC
  Filled 2017-06-20: qty 2

## 2017-06-20 MED ORDER — LURASIDONE HCL 40 MG PO TABS
40.0000 mg | ORAL_TABLET | Freq: Every day | ORAL | Status: DC
  Administered 2017-06-20: 40 mg via ORAL
  Filled 2017-06-20 (×2): qty 1

## 2017-06-20 MED ORDER — PROPOFOL 10 MG/ML IV BOLUS
INTRAVENOUS | Status: AC
  Filled 2017-06-20: qty 20

## 2017-06-20 MED ORDER — ONDANSETRON HCL 4 MG/2ML IJ SOLN: 4.0000 mg | Freq: Four times a day (QID) | INTRAMUSCULAR | Status: DC | PRN

## 2017-06-20 MED ORDER — CEFAZOLIN SODIUM-DEXTROSE 2-4 GM/100ML-% IV SOLN
INTRAVENOUS | Status: AC
  Filled 2017-06-20: qty 100

## 2017-06-20 MED ORDER — FENTANYL CITRATE (PF) 100 MCG/2ML IJ SOLN
25.0000 ug | INTRAMUSCULAR | Status: DC | PRN
  Administered 2017-06-20 (×2): 50 ug via INTRAVENOUS

## 2017-06-20 MED ORDER — BRIMONIDINE TARTRATE 0.2 % OP SOLN
1.0000 [drp] | Freq: Two times a day (BID) | OPHTHALMIC | Status: DC
  Administered 2017-06-20 – 2017-06-21 (×2): 1 [drp] via OPHTHALMIC
  Filled 2017-06-20 (×2): qty 5

## 2017-06-20 MED ORDER — HEMOSTATIC AGENTS (NO CHARGE) OPTIME
TOPICAL | Status: DC | PRN
  Administered 2017-06-20: 1 via TOPICAL

## 2017-06-20 MED ORDER — ALBUTEROL SULFATE (2.5 MG/3ML) 0.083% IN NEBU: 3.0000 mL | INHALATION_SOLUTION | Freq: Four times a day (QID) | RESPIRATORY_TRACT | Status: DC | PRN

## 2017-06-20 MED ORDER — OXYCODONE HCL 5 MG PO TABS
5.0000 mg | ORAL_TABLET | ORAL | Status: DC | PRN
  Administered 2017-06-20: 10 mg via ORAL
  Administered 2017-06-20: 5 mg via ORAL
  Administered 2017-06-20 – 2017-06-21 (×3): 10 mg via ORAL
  Filled 2017-06-20 (×5): qty 2

## 2017-06-20 MED ORDER — SODIUM CHLORIDE 0.9% FLUSH: 3.0000 mL | INTRAVENOUS | Status: DC | PRN

## 2017-06-20 MED ORDER — TIMOLOL MALEATE 0.5 % OP SOLN
1.0000 [drp] | Freq: Two times a day (BID) | OPHTHALMIC | Status: DC
  Administered 2017-06-20 – 2017-06-21 (×2): 1 [drp] via OPHTHALMIC
  Filled 2017-06-20 (×2): qty 5

## 2017-06-20 MED ORDER — LISINOPRIL-HYDROCHLOROTHIAZIDE 20-12.5 MG PO TABS: 1.0000 | ORAL_TABLET | Freq: Every day | ORAL | Status: DC

## 2017-06-20 MED ORDER — THROMBIN 5000 UNITS EX SOLR
CUTANEOUS | Status: AC
  Filled 2017-06-20: qty 5000

## 2017-06-20 MED ORDER — FENTANYL CITRATE (PF) 250 MCG/5ML IJ SOLN
INTRAMUSCULAR | Status: AC
  Filled 2017-06-20: qty 5

## 2017-06-20 MED ORDER — ACETAMINOPHEN 325 MG PO TABS: 650.0000 mg | ORAL_TABLET | ORAL | Status: DC | PRN

## 2017-06-20 MED ORDER — LISINOPRIL 20 MG PO TABS
20.0000 mg | ORAL_TABLET | Freq: Every day | ORAL | Status: DC
  Administered 2017-06-20 – 2017-06-21 (×2): 20 mg via ORAL
  Filled 2017-06-20 (×2): qty 1

## 2017-06-20 MED ORDER — METOPROLOL SUCCINATE ER 25 MG PO TB24
50.0000 mg | ORAL_TABLET | Freq: Every day | ORAL | Status: DC
Start: 2017-06-21 — End: 2017-06-21
  Administered 2017-06-21: 50 mg via ORAL
  Filled 2017-06-20: qty 2

## 2017-06-20 MED ORDER — LIDOCAINE-EPINEPHRINE 0.5 %-1:200000 IJ SOLN
INTRAMUSCULAR | Status: DC | PRN
  Administered 2017-06-20: 50 mL

## 2017-06-20 MED ORDER — POTASSIUM CHLORIDE IN NACL 20-0.9 MEQ/L-% IV SOLN: INTRAVENOUS | Status: DC

## 2017-06-20 MED ORDER — CLONAZEPAM 0.5 MG PO TABS
1.0000 mg | ORAL_TABLET | Freq: Every day | ORAL | Status: DC
Start: 2017-06-20 — End: 2017-06-21
  Administered 2017-06-20: 1 mg via ORAL
  Filled 2017-06-20: qty 2

## 2017-06-20 MED ORDER — ONDANSETRON HCL 4 MG PO TABS: 4.0000 mg | ORAL_TABLET | Freq: Four times a day (QID) | ORAL | Status: DC | PRN

## 2017-06-20 MED ORDER — DIAZEPAM 5 MG PO TABS: 5.0000 mg | ORAL_TABLET | Freq: Four times a day (QID) | ORAL | Status: DC | PRN

## 2017-06-20 MED ORDER — ROCURONIUM BROMIDE 100 MG/10ML IV SOLN
INTRAVENOUS | Status: DC | PRN
  Administered 2017-06-20 (×2): 50 mg via INTRAVENOUS
  Administered 2017-06-20: 30 mg via INTRAVENOUS

## 2017-06-20 MED ORDER — ONDANSETRON HCL 4 MG/2ML IJ SOLN
INTRAMUSCULAR | Status: AC
  Filled 2017-06-20: qty 2

## 2017-06-20 MED ORDER — MENTHOL 3 MG MT LOZG: 1.0000 | LOZENGE | OROMUCOSAL | Status: DC | PRN

## 2017-06-20 MED ORDER — ARTIFICIAL TEARS OPHTHALMIC OINT
TOPICAL_OINTMENT | OPHTHALMIC | Status: AC
  Filled 2017-06-20: qty 3.5

## 2017-06-20 MED ORDER — LATANOPROST 0.005 % OP SOLN
1.0000 [drp] | Freq: Every day | OPHTHALMIC | Status: DC
  Administered 2017-06-20: 1 [drp] via OPHTHALMIC
  Filled 2017-06-20 (×2): qty 2.5

## 2017-06-20 MED ORDER — 0.9 % SODIUM CHLORIDE (POUR BTL) OPTIME
TOPICAL | Status: DC | PRN
  Administered 2017-06-20: 1000 mL

## 2017-06-20 MED ORDER — LIDOCAINE 2% (20 MG/ML) 5 ML SYRINGE
INTRAMUSCULAR | Status: AC
  Filled 2017-06-20: qty 5

## 2017-06-20 MED ORDER — ONDANSETRON HCL 4 MG/2ML IJ SOLN: 4.0000 mg | Freq: Once | INTRAMUSCULAR | Status: DC | PRN

## 2017-06-20 MED ORDER — BISMUTH SUBSALICYLATE 262 MG/15ML PO SUSP
30.0000 mL | ORAL | Status: DC | PRN
  Filled 2017-06-20: qty 118

## 2017-06-20 MED ORDER — HYDROCHLOROTHIAZIDE 12.5 MG PO CAPS
12.5000 mg | ORAL_CAPSULE | Freq: Every day | ORAL | Status: DC
  Administered 2017-06-20 – 2017-06-21 (×2): 12.5 mg via ORAL
  Filled 2017-06-20 (×2): qty 1

## 2017-06-20 MED ORDER — MEPERIDINE HCL 25 MG/ML IJ SOLN: 6.2500 mg | INTRAMUSCULAR | Status: DC | PRN

## 2017-06-20 MED ORDER — PROPOFOL 10 MG/ML IV BOLUS
INTRAVENOUS | Status: DC | PRN
  Administered 2017-06-20: 200 mg via INTRAVENOUS

## 2017-06-20 MED ORDER — VANCOMYCIN HCL 500 MG IV SOLR
INTRAVENOUS | Status: AC
  Filled 2017-06-20: qty 500

## 2017-06-20 MED ORDER — OXYCODONE HCL ER 10 MG PO T12A
10.0000 mg | EXTENDED_RELEASE_TABLET | Freq: Two times a day (BID) | ORAL | Status: DC
  Administered 2017-06-20 – 2017-06-21 (×2): 10 mg via ORAL
  Filled 2017-06-20: qty 1

## 2017-06-20 SURGICAL SUPPLY — 50 items
BAG DECANTER FOR FLEXI CONT (MISCELLANEOUS) ×3 IMPLANT
BENZOIN TINCTURE PRP APPL 2/3 (GAUZE/BANDAGES/DRESSINGS) ×3 IMPLANT
BLADE CLIPPER SURG (BLADE) IMPLANT
BUR MATCHSTICK NEURO 3.0 LAGG (BURR) ×3 IMPLANT
BUR PRECISION FLUTE 5.0 (BURR) ×3 IMPLANT
CANISTER SUCT 3000ML PPV (MISCELLANEOUS) ×3 IMPLANT
CARTRIDGE OIL MAESTRO DRILL (MISCELLANEOUS) ×1 IMPLANT
CLOSURE WOUND 1/2 X4 (GAUZE/BANDAGES/DRESSINGS) ×1
DECANTER SPIKE VIAL GLASS SM (MISCELLANEOUS) ×3 IMPLANT
DERMABOND ADVANCED (GAUZE/BANDAGES/DRESSINGS) ×2
DERMABOND ADVANCED .7 DNX12 (GAUZE/BANDAGES/DRESSINGS) ×1 IMPLANT
DIFFUSER DRILL AIR PNEUMATIC (MISCELLANEOUS) ×3 IMPLANT
DRAPE LAPAROTOMY 100X72X124 (DRAPES) ×3 IMPLANT
DRAPE MICROSCOPE LEICA (MISCELLANEOUS) ×3 IMPLANT
DRAPE POUCH INSTRU U-SHP 10X18 (DRAPES) ×3 IMPLANT
DRAPE SURG 17X23 STRL (DRAPES) ×3 IMPLANT
DURAPREP 26ML APPLICATOR (WOUND CARE) ×3 IMPLANT
ELECT REM PT RETURN 9FT ADLT (ELECTROSURGICAL) ×3
ELECTRODE REM PT RTRN 9FT ADLT (ELECTROSURGICAL) ×1 IMPLANT
GAUZE SPONGE 4X4 12PLY STRL (GAUZE/BANDAGES/DRESSINGS) IMPLANT
GAUZE SPONGE 4X4 16PLY XRAY LF (GAUZE/BANDAGES/DRESSINGS) ×3 IMPLANT
GLOVE ECLIPSE 6.5 STRL STRAW (GLOVE) ×3 IMPLANT
GLOVE EXAM NITRILE LRG STRL (GLOVE) IMPLANT
GLOVE EXAM NITRILE XL STR (GLOVE) IMPLANT
GLOVE EXAM NITRILE XS STR PU (GLOVE) IMPLANT
GOWN STRL REUS W/ TWL LRG LVL3 (GOWN DISPOSABLE) ×2 IMPLANT
GOWN STRL REUS W/ TWL XL LVL3 (GOWN DISPOSABLE) ×1 IMPLANT
GOWN STRL REUS W/TWL 2XL LVL3 (GOWN DISPOSABLE) IMPLANT
GOWN STRL REUS W/TWL LRG LVL3 (GOWN DISPOSABLE) ×4
GOWN STRL REUS W/TWL XL LVL3 (GOWN DISPOSABLE) ×2
KIT BASIN OR (CUSTOM PROCEDURE TRAY) ×3 IMPLANT
KIT ROOM TURNOVER OR (KITS) ×3 IMPLANT
NEEDLE HYPO 25X1 1.5 SAFETY (NEEDLE) ×3 IMPLANT
NEEDLE SPNL 18GX3.5 QUINCKE PK (NEEDLE) ×3 IMPLANT
NS IRRIG 1000ML POUR BTL (IV SOLUTION) ×3 IMPLANT
OIL CARTRIDGE MAESTRO DRILL (MISCELLANEOUS) ×3
PACK LAMINECTOMY NEURO (CUSTOM PROCEDURE TRAY) ×3 IMPLANT
PAD ARMBOARD 7.5X6 YLW CONV (MISCELLANEOUS) ×9 IMPLANT
RUBBERBAND STERILE (MISCELLANEOUS) ×6 IMPLANT
SPONGE LAP 4X18 X RAY DECT (DISPOSABLE) ×3 IMPLANT
SPONGE SURGIFOAM ABS GEL SZ50 (HEMOSTASIS) ×3 IMPLANT
STRIP CLOSURE SKIN 1/2X4 (GAUZE/BANDAGES/DRESSINGS) ×2 IMPLANT
SURGIFLO W/THROMBIN 8M KIT (HEMOSTASIS) ×3 IMPLANT
SUT VIC AB 0 CT1 18XCR BRD8 (SUTURE) ×1 IMPLANT
SUT VIC AB 0 CT1 8-18 (SUTURE) ×2
SUT VIC AB 2-0 CT1 18 (SUTURE) ×3 IMPLANT
SUT VIC AB 3-0 SH 8-18 (SUTURE) ×3 IMPLANT
TOWEL GREEN STERILE (TOWEL DISPOSABLE) ×3 IMPLANT
TOWEL GREEN STERILE FF (TOWEL DISPOSABLE) ×3 IMPLANT
WATER STERILE IRR 1000ML POUR (IV SOLUTION) ×3 IMPLANT

## 2017-06-20 NOTE — Evaluation (Signed)
Physical Therapy Evaluation Patient Details Name: Cassie Harrell MRN: [id number] DOB: 1951-03-15 Today's Date: 06/20/2017   History of Present Illness  pt is a 66 y/o female with pmh significant for PVD, PTSD, neuropathy in both feet, HTN, COPD, bipolar d/o, admitted with pain in left LE and HNP at L 4/5, s/p microdiscectomy of L4 and L5 on Left.  Clinical Impression  Pt admitted with/for back pain, s/p lumbar microdiskectomy.  Pt needing minimal assist for basic mobility at time of evaluation.  Pt currently limited functionally due to the problems listed below.  (see problems list.)  Pt will benefit from PT to maximize function and safety to be able to get home safely with available assist of family.     Follow Up Recommendations No PT follow up    Equipment Recommendations  None recommended by PT (unless doesn't have RW and needs)    Recommendations for Other Services       Precautions / Restrictions Precautions Precautions: Back Precaution Booklet Issued: Yes (comment) Restrictions Weight Bearing Restrictions: No      Mobility  Bed Mobility Overal bed mobility: Needs Assistance Bed Mobility: Rolling;Sidelying to Sit Rolling: Min assist Sidelying to sit: Min assist       General bed mobility comments: slow with moderate use of the rail  Transfers Overall transfer level: Needs assistance   Transfers: Sit to/from Stand Sit to Stand: Min assist            Ambulation/Gait Ambulation/Gait assistance: Min guard Ambulation Distance (Feet): 100 Feet Assistive device:  (iv pole) Gait Pattern/deviations: Step-through pattern   Gait velocity interpretation: Below normal speed for age/gender General Gait Details: slow short guarded steps  Stairs            Wheelchair Mobility    Modified Rankin (Stroke Patients Only)       Balance Overall balance assessment: No apparent balance deficits (not formally assessed)                                            Pertinent Vitals/Pain Pain Assessment: Faces Faces Pain Scale: Hurts even more Pain Location: back incision Pain Descriptors / Indicators: Grimacing;Sore Pain Intervention(s): Monitored during session    Home Living Family/patient expects to be discharged to:: Private residence Living Arrangements: Alone Available Help at Discharge: Personal care attendant (PCA 1 hr 3x/wk, family PRN) Type of Home: Apartment Home Access: Stairs to enter Entrance Stairs-Rails: Doctor, general practice of Steps: flight Home Layout: One level Home Equipment:  (pt can get some equipment)      Prior Function Level of Independence: Independent               Hand Dominance        Extremity/Trunk Assessment   Upper Extremity Assessment Upper Extremity Assessment: Overall WFL for tasks assessed    Lower Extremity Assessment Lower Extremity Assessment: Overall WFL for tasks assessed       Communication   Communication: No difficulties  Cognition Arousal/Alertness: Awake/alert Behavior During Therapy: WFL for tasks assessed/performed Overall Cognitive Status: Within Functional Limits for tasks assessed                                        General Comments General comments (skin integrity, edema, etc.): pt and family  advised on back care/precautions, log roll and transitioning to EOB, lifting restrictions and progression of activity.    Exercises     Assessment/Plan    PT Assessment Patient needs continued PT services  PT Problem List Decreased activity tolerance;Decreased mobility;Decreased knowledge of use of DME;Decreased knowledge of precautions;Pain       PT Treatment Interventions      PT Goals (Current goals can be found in the Care Plan section)  Acute Rehab PT Goals Patient Stated Goal: home independent PT Goal Formulation: With patient Time For Goal Achievement: 06/22/17 Potential to Achieve Goals: Good     Frequency Min 5X/week   Barriers to discharge        Co-evaluation               AM-PAC PT "6 Clicks" Daily Activity  Outcome Measure Difficulty turning over in bed (including adjusting bedclothes, sheets and blankets)?: Unable Difficulty moving from lying on back to sitting on the side of the bed? : Unable Difficulty sitting down on and standing up from a chair with arms (e.g., wheelchair, bedside commode, etc,.)?: Unable Help needed moving to and from a bed to chair (including a wheelchair)?: A Little Help needed walking in hospital room?: A Little Help needed climbing 3-5 steps with a railing? : A Little 6 Click Score: 12    End of Session   Activity Tolerance: Patient tolerated treatment well Patient left: with call bell/phone within reach (in Bathroom, ) Nurse Communication: Mobility status PT Visit Diagnosis: Other abnormalities of gait and mobility (R26.89);Pain Pain - part of body:  (back)    Time: 1815-1840 PT Time Calculation (min) (ACUTE ONLY): 25 min   Charges:   PT Evaluation $PT Eval Low Complexity: 1 Low PT Treatments $Gait Training: 8-22 mins   PT G Codes:   PT G-Codes **NOT FOR INPATIENT CLASS** Functional Assessment Tool Used: AM-PAC 6 Clicks Basic Mobility;Clinical judgement Functional Limitation: Mobility: Walking and moving around Mobility: Walking and Moving Around Current Status ([id number](G8978): At least 1 percent but less than 20 percent impaired, limited or restricted Mobility: Walking and Moving Around Goal Status [id number]([id number]): 0 percent impaired, limited or restricted    06/20/2017  Kief BingKen Nevelyn Mellott, PT [phone]  (pager)  Eliseo GumKenneth V Olyver Hawes 06/20/2017, 6:50 PM

## 2017-06-20 NOTE — Anesthesia Procedure Notes (Signed)
Procedure Name: Intubation Date/Time: 06/20/2017 12:25 PM Performed by: Janeece Riggers Pre-anesthesia Checklist: Patient identified, Emergency Drugs available, Suction available and Patient being monitored Patient Re-evaluated:Patient Re-evaluated prior to induction Oxygen Delivery Method: Circle system utilized Preoxygenation: Pre-oxygenation with 100% oxygen Induction Type: IV induction Ventilation: Mask ventilation without difficulty Laryngoscope Size: Mac, 3 and Glidescope (DLx3 with MAC3 with intubation of esophagus, tube removed with no de-sat noted.  Glidescope performed with extremely anterior larynx) Grade View: Grade IV Tube type: Oral Tube size: 7.0 mm Number of attempts: 4 (DLx3 with MAC3, DL with glidescope) Airway Equipment and Method: Video-laryngoscopy and Stylet Placement Confirmation: ETT inserted through vocal cords under direct vision,  positive ETCO2 and breath sounds checked- equal and bilateral Secured at: 22 cm Tube secured with: Tape Dental Injury: Teeth and Oropharynx as per pre-operative assessment  Difficulty Due To: Difficulty was unanticipated and Difficult Airway- due to anterior larynx

## 2017-06-20 NOTE — Anesthesia Preprocedure Evaluation (Addendum)
Anesthesia Evaluation  Patient identified by MRN, date of birth, ID band Patient awake    Reviewed: Allergy & Precautions, NPO status , Patient's Chart, lab work & pertinent test results  Airway Mallampati: II  TM Distance: >3 FB Neck ROM: Full    Dental no notable dental hx.    Pulmonary neg pulmonary ROS, sleep apnea , COPD,  COPD inhaler, Current Smoker,    Pulmonary exam normal breath sounds clear to auscultation       Cardiovascular hypertension, negative cardio ROS Normal cardiovascular exam Rhythm:Regular Rate:Normal     Neuro/Psych Anxiety Depression Bipolar Disorder negative neurological ROS  negative psych ROS   GI/Hepatic negative GI ROS, Neg liver ROS, GERD  Medicated,  Endo/Other  negative endocrine ROS  Renal/GU negative Renal ROS  negative genitourinary   Musculoskeletal negative musculoskeletal ROS (+) Arthritis , Osteoarthritis,  Fibromyalgia -  Abdominal   Peds negative pediatric ROS (+)  Hematology negative hematology ROS (+)   Anesthesia Other Findings   Reproductive/Obstetrics negative OB ROS                            Anesthesia Physical Anesthesia Plan  ASA: II  Anesthesia Plan: General   Post-op Pain Management:    Induction: Intravenous  PONV Risk Score and Plan: 2 and Ondansetron, Dexamethasone, Midazolam and Treatment may vary due to age or medical condition  Airway Management Planned: Oral ETT  Additional Equipment:   Intra-op Plan:   Post-operative Plan: Extubation in OR  Informed Consent:   Plan Discussed with:   Anesthesia Plan Comments: (  )        Anesthesia Quick Evaluation

## 2017-06-20 NOTE — Op Note (Signed)
06/20/2017  3:17 PM  PATIENT:  Cassie Harrell  66 y.o. female with pain in the left lower extremity and an HNP at L4/5 eccentric to the left  PRE-OPERATIVE DIAGNOSIS:  HNP L4/5, left  POST-OPERATIVE DIAGNOSIS: HNP L4/5, left  PROCEDURE:  Procedure(s): MICRODISCECTOMY LUMBAR 4- LUMBAR 5 LEFT  SURGEON:   Surgeon(s): Coletta Memosabbell, Sary Bogie, MD  ASSISTANTS:none  ANESTHESIA:   general  EBL:  Total I/O In: 1100 [I.V.:1100] Out: 10 [Blood:10]  BLOOD ADMINISTERED:none  CELL SAVER GIVEN:none  COUNT:per nursing  DRAINS: none   SPECIMEN:  No Specimen  DICTATION: Cassie Harrell was taken to the operating room, intubated and placed under a general anesthetic without difficulty. She was positioned prone on a Wilson frame with all pressure points padded. Her back was prepped and draped in a sterile manner. I opened the skin with a 10 blade and carried the dissection down to the thoracolumbar fascia. I used both sharp dissection and the monopolar cautery to expose the lamina of L4, and L5. I confirmed my location with an intraoperative xray.  I used the drill, Kerrison punches, and curettes to perform a semihemilaminectomy of L4. I used the punches to remove the ligamentum flavum to expose the thecal sac. I brought the microscope into the operative field and I started the decompression of the spinal canal, thecal sac and L5 root(s). I cauterized epidural veins overlying the disc space then divided them sharply. I opened the disc space with a 15 blade and proceeded with the discectomy. I used pituitary rongeurs, curettes, and other instruments to remove disc material. After the discectomy was completed I inspected the L5 nerve root and felt it was well decompressed. I explored rostrally, laterally, medially, and caudally and was satisfied with the decompression. I irrigated the wound, then closed in layers. I approximated the thoracolumbar fascia, subcutaneous, and subcuticular planes with vicryl  sutures. I used dermabond for a sterile dressing.   PLAN OF CARE: Admit for overnight observation  PATIENT DISPOSITION:  PACU - hemodynamically stable.   Delay start of Pharmacological VTE agent (>24hrs) due to surgical blood loss or risk of bleeding:  yes

## 2017-06-20 NOTE — Transfer of Care (Signed)
Immediate Anesthesia Transfer of Care Note  Patient: Cassie Harrell  Procedure(s) Performed: Procedure(s) with comments: MICRODISCECTOMY LUMBAR 4- LUMBAR 5 LEFT (Left) - MICRODISCECTOMY LUMBAR 4- LUMBAR 5 LEFT  Patient Location: PACU  Anesthesia Type:General  Level of Consciousness: awake, alert  and patient cooperative  Airway & Oxygen Therapy: Patient Spontanous Breathing and Patient connected to face mask oxygen  Post-op Assessment: Report given to RN, Post -op Vital signs reviewed and stable, Patient moving all extremities X 4 and Patient able to stick tongue midline  Post vital signs: Reviewed and stable  Last Vitals:  Vitals:   06/20/17 1022 06/20/17 1510  BP: (!) 145/68   Pulse: (!) 59   Resp: 18   Temp: 37 C (!) 36.3 C  SpO2: 100%     Last Pain:  Vitals:   06/20/17 1032  TempSrc:   PainSc: 5       Patients Stated Pain Goal: 3 (06/20/17 1032)  Complications: No apparent anesthesia complications

## 2017-06-20 NOTE — Anesthesia Postprocedure Evaluation (Signed)
Anesthesia Post Note  Patient: Tax adviserMargie Ree Ohaver  Procedure(s) Performed: Procedure(s) (LRB): MICRODISCECTOMY LUMBAR 4- LUMBAR 5 LEFT (Left)     Patient location during evaluation: PACU Anesthesia Type: General Level of consciousness: awake and alert Pain management: pain level controlled Vital Signs Assessment: post-procedure vital signs reviewed and stable Respiratory status: spontaneous breathing, nonlabored ventilation, respiratory function stable and patient connected to nasal cannula oxygen Cardiovascular status: blood pressure returned to baseline and stable Postop Assessment: no signs of nausea or vomiting Anesthetic complications: no    Last Vitals:  Vitals:   06/20/17 1545 06/20/17 1556  BP:  (!) 145/83  Pulse: 72 72  Resp: 11 12  Temp:    SpO2: 97% 99%    Last Pain:  Vitals:   06/20/17 1540  TempSrc:   PainSc: Asleep                 Chrystel Barefield

## 2017-06-20 NOTE — H&P (Signed)
BP (!) 145/68   Pulse (!) 59   Temp 98.6 F (37 C) (Oral)   Resp 18   Ht 5\' 3"  (1.6 m)   Wt 66.2 kg (146 lb)   SpO2 100%   BMI 25.86 kg/m  Cassie Harrell is a 66 year old woman who presents today for evaluation of pain, which is everywhere. She has a lot of pain in her neck. She has numbness in her hand. She has a lot of pain in her arms. She has a lot of pain in both legs. She has pain across the lower back. She has pain in her feet. She has difficulty sleeping at night. She has made multiple trips to the emergency room because of her pain. She has no bowel or bladder dysfunction that she attributes to this, but she does have irritable bowel syndrome. She keeps a walker next to her bed because some days it is hard to get up, the walker allows her to get up. She says the pain has taken over her life. Sometimes it feels like she has been shot in her buttocks. She has been told, she stated, that her vertebrae are messed up. She had 2 messed up vertebrae in her neck and in her lower back. She was seen by an orthopedic surgeon whom she said told her initially she needed surgery, then told her she didn't need surgery. She says she has a history of neuropathy. If she does any kind of lifting, she will have very severe pain in the lower back. She says she knows she has messed up nerves and that that is also contributing to her pain.  She is currently disabled. She is left-handed. She does have a 20 pack-year smoking history. She does not use alcohol. She does have a history of substance abuse. PAST MEDICAL HISTORY: Includes hypertension, myocardial infarction, chronic obstructive pulmonary disease, and acid reflux. PAST SURGICAL HISTORY: She has undergone a hysterectomy, sleep apnea surgery, and she has had abdominal aspiration after appendectomy and she has undergone an appendectomy. ALLERGIES: Codeine, Flagyl, imipramine, diclofenac, Mobic, and meloxicam. MEDICATIONS: She takes Lisinopril,  Omeprazole, Aspirin, Linzess, Metoprolol, Venlafaxine, Incruse, Clonazepam, and Latuda. FAMILY HISTORY: Mother died at age 73. Father died at age 39 secondary to cardiac arrest. Kidney disease, thyroid disease, diabetes, and arthritis present in the family history.  In her words, she has severe neck, spine, lower back pain; sometimes right arm, right leg, left arm, left leg, and shoulder blade pain. Says this got very bad 2-3 years ago, but has a decade-history of it. Says the oxycodone prescribed by the ER, hydrocodone and morphine when she goes to the ER, helps the pain. She says she may have 1-2 days each week without pain. Currently, she has 10/10 pain. She has lost consciousness, and her weight has been stable. REVIEW OF SYSTEMS: Positive for glaucoma, tinnitus, leg pain with walking, swelling in the feet, chest pain, irregular pulse, arthritis, neck pain, back pain, arm pain, leg pain, joint pain, anxiety, depression, and bipolar disorder. PHYSICAL EXAMINATION: She is alert, oriented by 4, answering all questions appropriately. Memory, language, attention span, and fund of knowledge are normal. Speech is clear and it is also fluent. Hearing intact to voice. Uvula elevates midline. Shoulder shrug is normal. Tongue protrudes in the midline. 5/5 strength in the upper and lower extremities. Romberg is positive. She has difficulty tandem walking. She can toe walk, heel walk, and do a squat without difficulty. IMAGING: MRI was reviewed and shows a herniated  disc eccentric to the left in the lumbar region at L4-5. That disc is mildly degenerated. The rest of the lumbar spine is perfect. Cauda equina is normal. Conus medullaris is normal. Some osteophytes present at 4-5. In the cervical spine, she has anterior osteophytes at 3-4, 4-5, and 5-6. She has a disc eccentric to the left at 3-4. She has mild canal narrowing but no cord compression, no abnormal cord signal. Some mild foraminal narrowing on the right  side at 4-5 and 5-6 and a kyphotic deformity centered around C4. Ms. Cassie Harrell returned today so we could go over the MRI of the lumbar spine. What it shows is that, she has a herniated disc eccentric to the left at L4-L5. I do believe this is the reason for the pain that she has had in her left lower extremity; though she does have some pain on the right side, the main portion of her complaint lies on the left. She was shown the MRI, and I explained how it is causing pressure to be placed on the nerve root, which leads to the radicular symptoms that she describes. PHYSICAL EXAMINATION:  Risks and benefits of surgical intervention, including bleeding, infection, no pain relief, disc issue recurrence, weakness in the left ankle, and bowel/bladder dysfunction were discussed. She understands and wishes to proceed. She received a detailed instruction sheet today. This will be scheduled for Wednesday, September 5.

## 2017-06-21 ENCOUNTER — Encounter (HOSPITAL_COMMUNITY): Payer: Self-pay | Admitting: Neurosurgery

## 2017-06-21 DIAGNOSIS — M5126 Other intervertebral disc displacement, lumbar region: Secondary | ICD-10-CM | POA: Diagnosis not present

## 2017-06-21 MED ORDER — OXYCODONE HCL 5 MG PO TABS: 5.0000 mg | ORAL_TABLET | Freq: Four times a day (QID) | ORAL | 0 refills | Status: DC | PRN

## 2017-06-21 MED ORDER — TIZANIDINE HCL 4 MG PO TABS: 4.0000 mg | ORAL_TABLET | Freq: Four times a day (QID) | ORAL | 0 refills | Status: DC | PRN

## 2017-06-21 NOTE — Progress Notes (Signed)
Physical Therapy Treatment Patient Details Name: Cassie SlotMargie Ree Harrell MRN: [id number] DOB: 02-01-1951 Today's Date: 06/21/2017    History of Present Illness pt is a 66 y/o female with pmh significant for PVD, PTSD, neuropathy in both feet, HTN, COPD, bipolar d/o, admitted with pain in left LE and HNP at L 4/5, s/p microdiscectomy of L4 and L5 on Left.    PT Comments    Pt making good progress with mobility and successfully completed stair training. PT will continue to follow acutely to ensure a safe d/c home.   Follow Up Recommendations  No PT follow up     Equipment Recommendations  None recommended by PT    Recommendations for Other Services       Precautions / Restrictions Precautions Precautions: Back Precaution Comments: PT reviewed 3/3 back precautions with pt throughout Restrictions Weight Bearing Restrictions: No    Mobility  Bed Mobility               General bed mobility comments: pt sitting EOB upon arrival  Transfers Overall transfer level: Needs assistance Equipment used: None Transfers: Sit to/from Stand Sit to Stand: Supervision         General transfer comment: supervision for safety  Ambulation/Gait Ambulation/Gait assistance: Supervision Ambulation Distance (Feet): 200 Feet Assistive device: None Gait Pattern/deviations: Step-through pattern Gait velocity: decreased Gait velocity interpretation: Below normal speed for age/gender General Gait Details: slow, cautious, steady gait; supervision for safety   Stairs Stairs: Yes   Stair Management: One rail Left;Alternating pattern;Forwards Number of Stairs: 8 General stair comments: min guard and cueing for safety  Wheelchair Mobility    Modified Rankin (Stroke Patients Only)       Balance Overall balance assessment: No apparent balance deficits (not formally assessed)                                          Cognition Arousal/Alertness: Awake/alert Behavior  During Therapy: WFL for tasks assessed/performed Overall Cognitive Status: Within Functional Limits for tasks assessed                                        Exercises      General Comments        Pertinent Vitals/Pain Pain Assessment: 0-10 Pain Score: 6  Pain Location: back incision Pain Descriptors / Indicators: Grimacing;Sore Pain Intervention(s): Monitored during session;Repositioned    Home Living                      Prior Function            PT Goals (current goals can now be found in the care plan section) Acute Rehab PT Goals PT Goal Formulation: With patient Time For Goal Achievement: 06/22/17 Potential to Achieve Goals: Good Progress towards PT goals: Progressing toward goals    Frequency    Min 5X/week      PT Plan Current plan remains appropriate    Co-evaluation              AM-PAC PT "6 Clicks" Daily Activity  Outcome Measure  Difficulty turning over in bed (including adjusting bedclothes, sheets and blankets)?: A Little Difficulty moving from lying on back to sitting on the side of the bed? : A Little Difficulty sitting down on and  standing up from a chair with arms (e.g., wheelchair, bedside commode, etc,.)?: None Help needed moving to and from a bed to chair (including a wheelchair)?: None Help needed walking in hospital room?: None Help needed climbing 3-5 steps with a railing? : A Little 6 Click Score: 21    End of Session Equipment Utilized During Treatment: Gait belt Activity Tolerance: Patient tolerated treatment well Patient left: with call bell/phone within reach;with family/visitor present Nurse Communication: Mobility status PT Visit Diagnosis: Other abnormalities of gait and mobility (R26.89);Pain Pain - part of body:  (back)     Time: 4098-1191 PT Time Calculation (min) (ACUTE ONLY): 17 min  Charges:  $Gait Training: 8-22 mins                    G Codes:       [place], [place],  [place] [phone]    Cassie Harrell 06/21/2017, 11:09 AM

## 2017-06-21 NOTE — Discharge Instructions (Signed)
Lumbar Discectomy °Care After °A discectomy involves removal of discmaterial (the cartilage-like structures located between the bones of the back). It is done to relieve pressure on nerve roots. It can be used as a treatment for a back problem. The time in surgery depends on the findings in surgery and what is necessary to correct the problems. °HOME CARE INSTRUCTIONS  °· Check the cut (incision) made by the surgeon twice a day for signs of infection. Some signs of infection may include:  °· A foul smelling, greenish or yellowish discharge from the wound.  °· Increased pain.  °· Increased redness over the incision (operative) site.  °· The skin edges may separate.  °· Flu-like symptoms (problems).  °· A temperature above 101.5° F (38.6° C).  °· Change your bandages in about 24 to 36 hours following surgery or as directed.  °· You may shower tomrrow.  Avoid bathtubs, swimming pools and hot tubs for three weeks or until your incision has healed completely. °· Follow your doctor's instructions as to safe activities, exercises, and physical therapy.  °· Weight reduction may be beneficial if you are overweight.  °· Daily exercise is helpful to prevent the return of problems. Walking is permitted. You may use a treadmill without an incline. Cut down on activities and exercise if you have discomfort. You may also go up and down stairs as much as you can tolerate.  °· DO NOT lift anything heavier than 10 to 15 lbs. Avoid bending or twisting at the waist. Always bend your knees when lifting.  °· Maintain strength and range of motion as instructed.  °· Do not drive for 10 days, or as directed by your doctors. You may be a passenger . Lying back in the passenger seat may be more comfortable for you. Always wear a seatbelt.  °· Limit your sitting in a regular chair to 20 to 30 minutes at a time. There are no limitations for sitting in a recliner. You should lie down or walk in between sitting periods.  °· Only take  over-the-counter or prescription medicines for pain, discomfort, or fever as directed by your caregiver.  °SEEK MEDICAL CARE IF:  °· There is increased bleeding (more than a small spot) from the wound.  °· You notice redness, swelling, or increasing pain in the wound.  °· Pus is coming from wound.  °· You develop an unexplained oral temperature above 102° F (38.9° C) develops.  °· You notice a foul smell coming from the wound or dressing.  °· You have increasing pain in your wound.  °SEEK IMMEDIATE MEDICAL CARE IF:  °· You develop a rash.  °· You have difficulty breathing.  °· You develop any allergic problems to medicines given.  °Document Released: 09/06/2004 Document Revised: 09/21/2011 Document Reviewed: 12/26/2007 °[employer]® Patient Information °

## 2017-06-21 NOTE — Progress Notes (Signed)
Pt doing well. Pt given D/C instructions with Rx's, verbal understanding was provided. Pt's IV was removed prior to D/C. Pt's incision is clean and dry with no sign of infection. Pt D/C'd home via wheelchair @ 1215 per MD order. Pt is stable @ D/C and has no other needs at this time. Rema FendtAshley Renalda Locklin, RN

## 2017-09-09 IMAGING — MR MR CERVICAL SPINE W/O CM
4 of 5 series · 18 of 48 positions shown · IV contrast (Yes)
Comparison: Prior CT from 08/20/2016.

CLINICAL DATA: Initial evaluation for severe neck pain, extending
into back and shoulders for past 2 years, worsened over past 4
months. Also with numbness in hands for past 4 months.

EXAM:
MRI CERVICAL SPINE WITHOUT CONTRAST
TECHNIQUE: Multiplanar, multisequence MR imaging of the cervical spine was
performed. No intravenous contrast was administered.

[Series 2: T1 · sagittal · 3.0mm · 0.41mm/px · 3 of 13 slices shown]
[im 3/13]
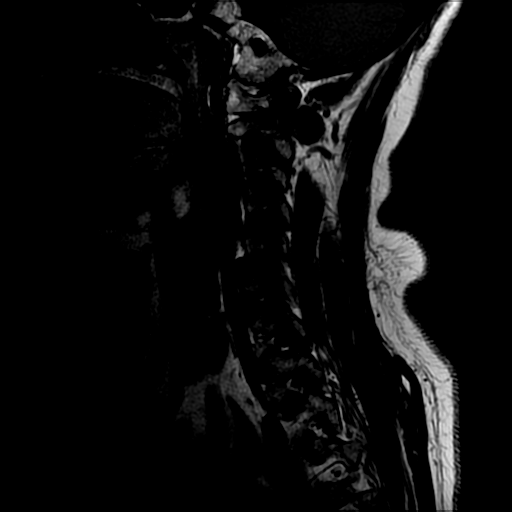
[im 7/13]
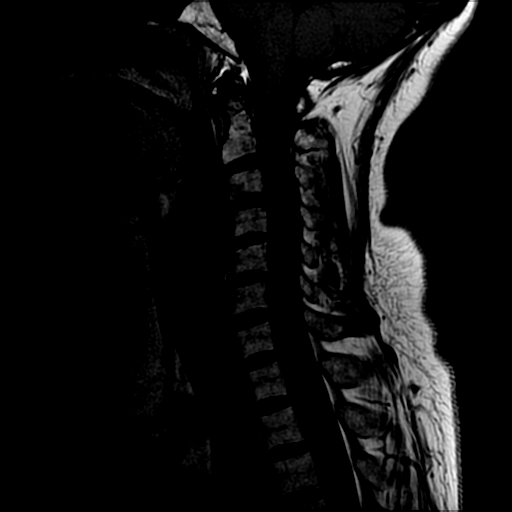
[im 11/13]
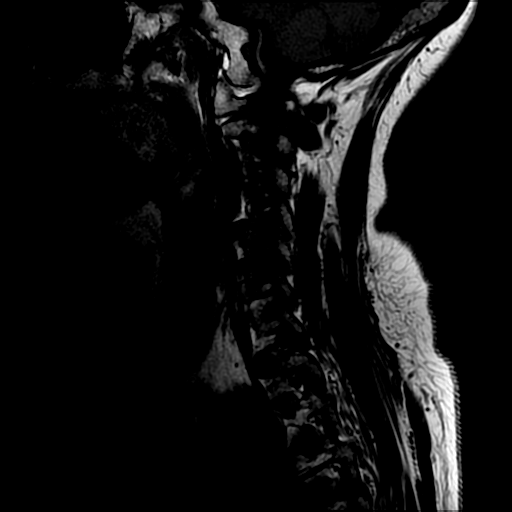

[Series 3: sag ir · sagittal · 3.0mm · 0.41mm/px · 3 of 13 slices shown]
[im 3/13]
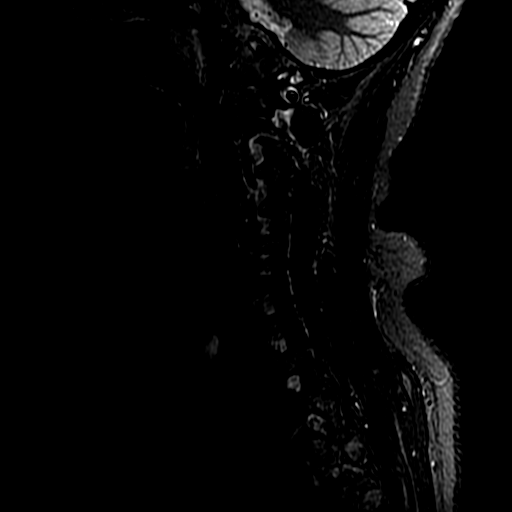
[im 7/13]
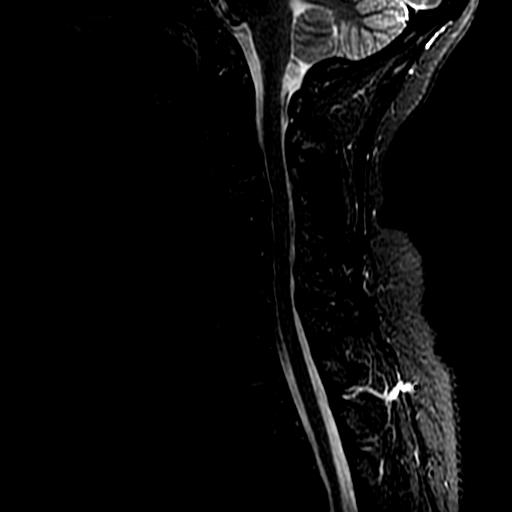
[im 11/13]
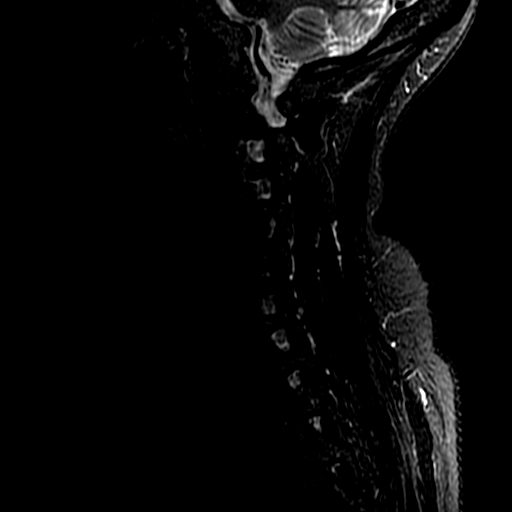

[Series 4: T2 post-contrast · sagittal · 3.0mm · 0.41mm/px · 6 of 13 slices shown]
[im 1/13]
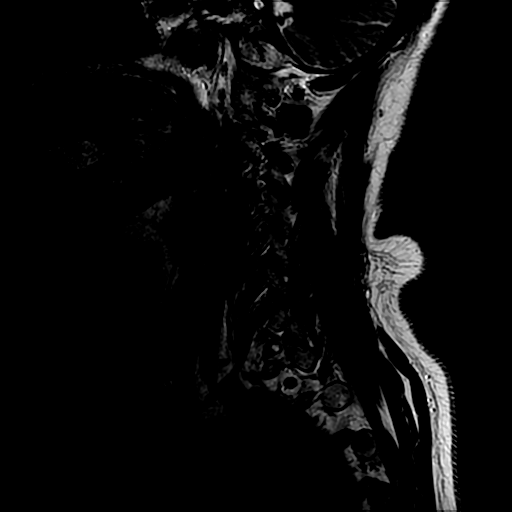
[im 3/13]
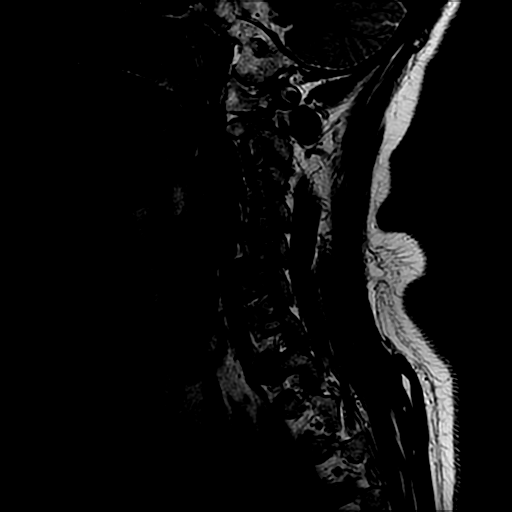
[im 5/13]
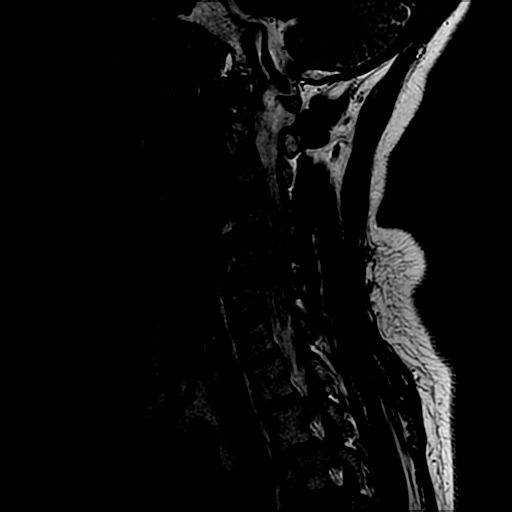
[im 8/13]
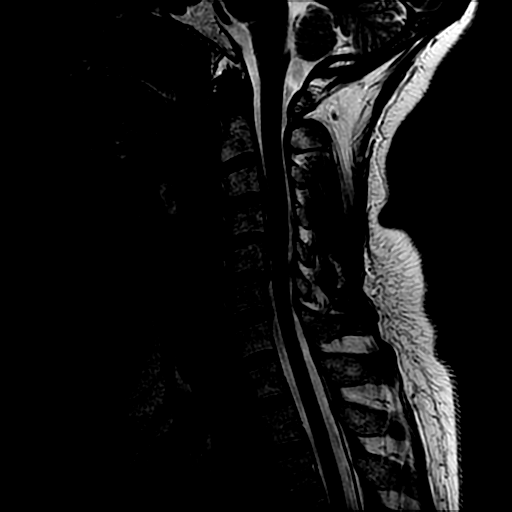
[im 10/13]
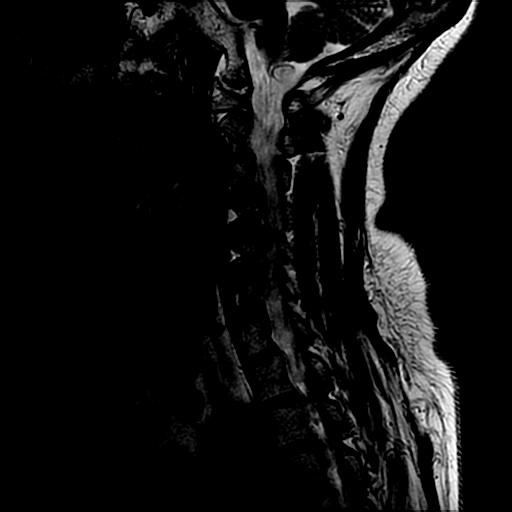
[im 13/13]
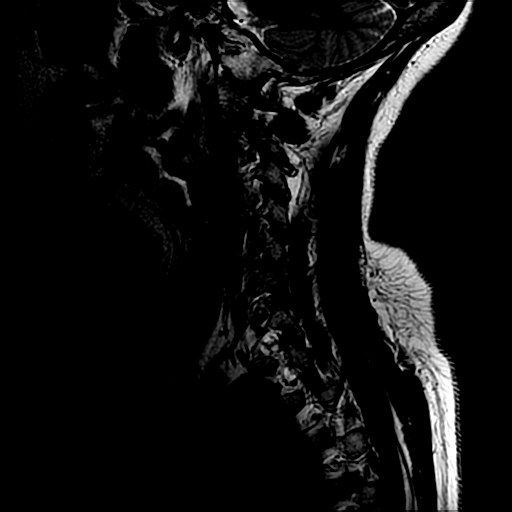

[Series 6: T2 · axial · 3.0mm · 0.35mm/px · z∈[-41,+33]mm · 6 of 29 slices shown]
[im 1/29]
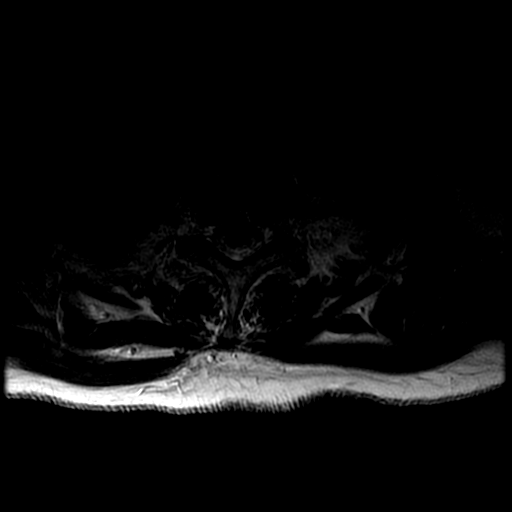
[im 5/29]
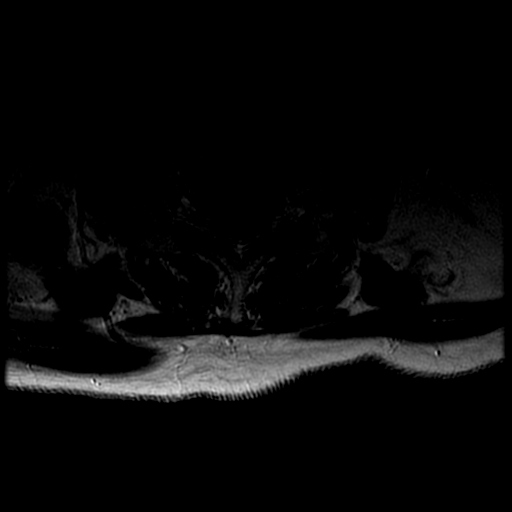
[im 9/29]
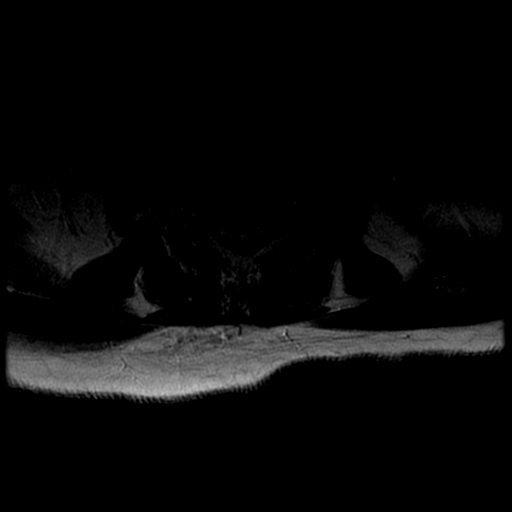
[im 13/29]
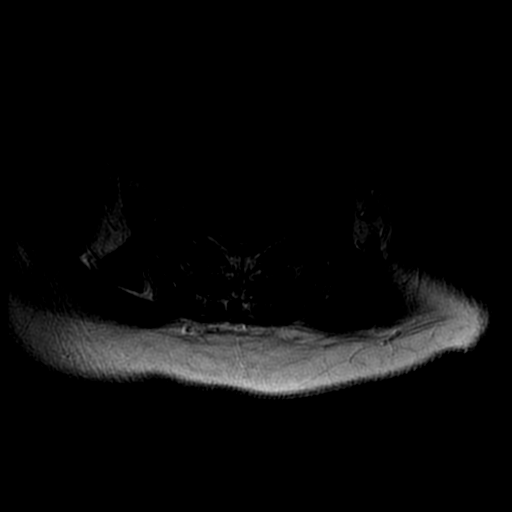
[im 16/29]
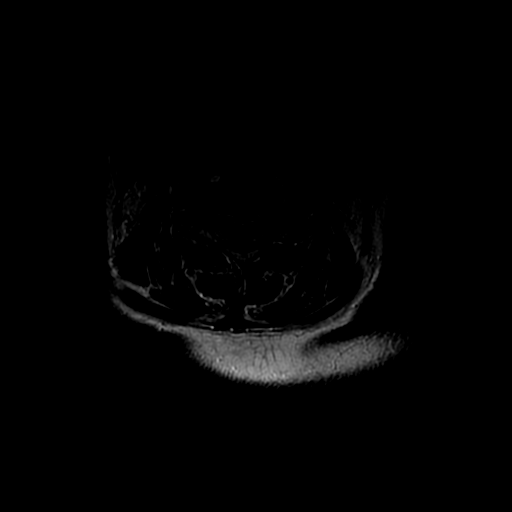
[im 24/29]
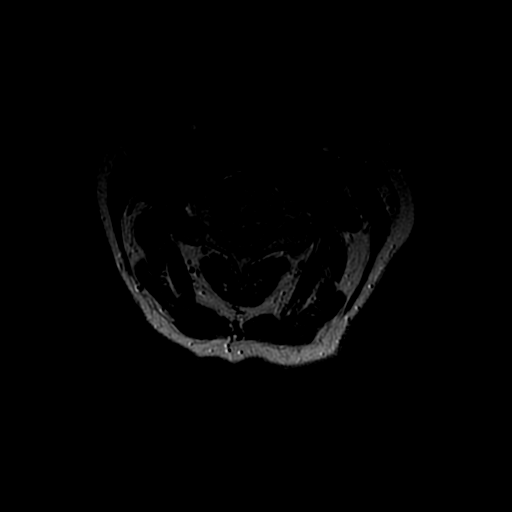

[18 of 48 positions shown; findings below may reference images not displayed]

FINDINGS: Alignment: Reversal of the normal cervical lordosis with apex at C4.
Trace retrolisthesis of C4 on C5 and C5 on C6.

Vertebrae: Vertebral body heights maintained. No evidence for acute
or chronic fracture. Signal intensity within the vertebral body bone
marrow is normal. No worrisome osseous lesions. No abnormal marrow
edema.

Cord: Signal intensity within the cervical spinal cord is normal.

Posterior Fossa, vertebral arteries, paraspinal tissues: Visualized
brain and posterior fossa within normal limits. Craniocervical
junction normal. Paraspinous and prevertebral soft tissues within
normal limits. Normal intravascular flow voids present within the
vertebral arteries bilaterally.

Disc levels:

C2-C3: Small central disc protrusion indents the ventral thecal sac
without significant canal stenosis. Foramina are widely patent.

C3-C4: Diffuse degenerative disc bulge with mild bilateral
uncovertebral hypertrophy. Central disc protrusion indents the
ventral thecal sac. Protruding disc is slightly eccentric to the
left extending towards the left neural foramen. Resultant mild canal
stenosis. Mild to moderate left with mild right foraminal narrowing.

C4-C5: Diffuse degenerative disc bulge with bilateral broad
posterior disc osteophyte flattens and partially faces the ventral
thecal sac results in mild canal stenosis. Moderate right with mild
left foraminal narrowing.

C5-C6: Diffuse disc bulge with bilateral uncovertebral spurring.
Central disc osteophyte complex indents the ventral thecal sac
results in mild canal stenosis. Mild cord flattening without cord
signal changes. Moderate bilateral foraminal narrowing, right
greater than left.

C6-C7: Small central disc protrusion indents the ventral thecal sac
without significant canal stenosis. No significant foraminal
encroachment.

C7-T1:  Negative.

Visualized upper thoracic spine within normal limits.
IMPRESSION: 1. Moderate multilevel degenerative spondylolysis extending from
C3-4 through C6-7 as above. Resultant mild canal stenosis at these
levels.
2. Uncovertebral spurring at C4-5 and C5-6 with resultant moderate
bilateral foraminal stenosis. Please see above report for a full
description of these findings.

## 2017-10-26 ENCOUNTER — Emergency Department (HOSPITAL_COMMUNITY)
Admission: EM | Admit: 2017-10-26 | Discharge: 2017-10-27 | Disposition: A | Discharge status: Home / Self Care | Payer: Medicare Other | Attending: Emergency Medicine | Admitting: Emergency Medicine

## 2017-10-26 ENCOUNTER — Other Ambulatory Visit: Payer: Self-pay

## 2017-10-26 ENCOUNTER — Encounter (HOSPITAL_COMMUNITY): Payer: Self-pay | Admitting: Emergency Medicine

## 2017-10-26 DIAGNOSIS — F17210 Nicotine dependence, cigarettes, uncomplicated: Secondary | ICD-10-CM | POA: Diagnosis not present

## 2017-10-26 DIAGNOSIS — I959 Hypotension, unspecified: Secondary | ICD-10-CM | POA: Insufficient documentation

## 2017-10-26 DIAGNOSIS — N939 Abnormal uterine and vaginal bleeding, unspecified: Secondary | ICD-10-CM | POA: Diagnosis present

## 2017-10-26 DIAGNOSIS — Z7982 Long term (current) use of aspirin: Secondary | ICD-10-CM | POA: Insufficient documentation

## 2017-10-26 DIAGNOSIS — Z79899 Other long term (current) drug therapy: Secondary | ICD-10-CM | POA: Diagnosis not present

## 2017-10-26 DIAGNOSIS — I10 Essential (primary) hypertension: Secondary | ICD-10-CM | POA: Insufficient documentation

## 2017-10-26 DIAGNOSIS — J449 Chronic obstructive pulmonary disease, unspecified: Secondary | ICD-10-CM | POA: Diagnosis not present

## 2017-10-26 LAB — URINALYSIS, ROUTINE W REFLEX MICROSCOPIC
Bilirubin Urine: NEGATIVE
GLUCOSE, UA: NEGATIVE mg/dL
Hgb urine dipstick: NEGATIVE
KETONES UR: NEGATIVE mg/dL
LEUKOCYTES UA: NEGATIVE
NITRITE: NEGATIVE
PH: 5 (ref 5.0–8.0)
Protein, ur: NEGATIVE mg/dL
SPECIFIC GRAVITY, URINE: 1.011 (ref 1.005–1.030)

## 2017-10-26 LAB — I-STAT CHEM 8, ED
BUN: 20 mg/dL (ref 6–20)
CREATININE: 1.1 mg/dL — AB (ref 0.44–1.00)
Calcium, Ion: 1.06 mmol/L — ABNORMAL LOW (ref 1.15–1.40)
Chloride: 98 mmol/L — ABNORMAL LOW (ref 101–111)
GLUCOSE: 115 mg/dL — AB (ref 65–99)
HEMATOCRIT: 41 % (ref 36.0–46.0)
Hemoglobin: 13.9 g/dL (ref 12.0–15.0)
POTASSIUM: 3.2 mmol/L — AB (ref 3.5–5.1)
Sodium: 136 mmol/L (ref 135–145)
TCO2: 25 mmol/L (ref 22–32)

## 2017-10-26 LAB — CBC WITH DIFFERENTIAL/PLATELET
BASOS PCT: 1 %
Basophils Absolute: 0.1 10*3/uL (ref 0.0–0.1)
Eosinophils Absolute: 0.2 10*3/uL (ref 0.0–0.7)
Eosinophils Relative: 2 %
HEMATOCRIT: 41.3 % (ref 36.0–46.0)
HEMOGLOBIN: 14.1 g/dL (ref 12.0–15.0)
LYMPHS PCT: 51 %
Lymphs Abs: 4.3 10*3/uL — ABNORMAL HIGH (ref 0.7–4.0)
MCH: 31.5 pg (ref 26.0–34.0)
MCHC: 34.1 g/dL (ref 30.0–36.0)
MCV: 92.4 fL (ref 78.0–100.0)
MONO ABS: 0.6 10*3/uL (ref 0.1–1.0)
MONOS PCT: 7 %
NEUTROS ABS: 3.3 10*3/uL (ref 1.7–7.7)
NEUTROS PCT: 39 %
Platelets: 293 10*3/uL (ref 150–400)
RBC: 4.47 MIL/uL (ref 3.87–5.11)
RDW: 14.8 % (ref 11.5–15.5)
WBC: 8.4 10*3/uL (ref 4.0–10.5)

## 2017-10-26 MED ORDER — SODIUM CHLORIDE 0.9 % IV BOLUS (SEPSIS)
1000.0000 mL | Freq: Once | INTRAVENOUS | Status: AC
  Administered 2017-10-27: 1000 mL via INTRAVENOUS

## 2017-10-26 NOTE — ED Notes (Signed)
Dr. Ward at bedside.

## 2017-10-26 NOTE — ED Provider Notes (Signed)
TIME SEEN: 11:19 PM  CHIEF COMPLAINT: Vaginal bleeding  HPI: Patient is a 67 year old female with history of hypertension, COPD, substance abuse who presents to the emergency department with complaints of vaginal bleeding that started 2 days ago.  States she had a hysterectomy over 37 years ago in [facility].  States she is also had a bilateral oophorectomy and salpingectomy.  She states that she noticed pink tinged or small amount of blood with wiping after urination.  She denies abdominal pain, dysuria, fever, vaginal discharge.  Last sexual intercourse was 2 weeks ago.  States she uses a condom with sexual intercourse.  No previous history of STDs.  States she has not been putting anything else into her vagina recently.    Patient does have a low blood pressure here.  She states 3 weeks ago her primary care physician increased her blood pressure medication after seeing that her blood pressure was elevated.  She states that she thinks that her blood pressure was elevated in the office that day because of eating a lot of salt over the holidays.  She states previously she was on lisinopril 20 mg/HCTZ 12.5 mg daily but that this was increased to twice daily 3 weeks ago.  She states since starting this medication she has felt lightheaded and very fatigued.  No chest pain or chest discomfort, shortness of breath.  No vomiting or diarrhea.  ROS: See HPI Constitutional: no fever  Eyes: no drainage  ENT: no runny nose   Cardiovascular:  no chest pain  Resp: no SOB  GI: no vomiting GU: no dysuria Integumentary: no rash  Allergy: no hives  Musculoskeletal: no leg swelling  Neurological: no slurred speech ROS otherwise negative  PAST MEDICAL HISTORY/PAST SURGICAL HISTORY:  Past Medical History:  Diagnosis Date  . Alcohol abuse with alcohol-induced mood disorder (HCC) 07/31/2016  . Anxiety   . Bipolar 1 disorder (HCC)   . Cocaine abuse (HCC)   . Constipation   . COPD (chronic  obstructive pulmonary disease) (HCC)   . Dissociative disorder   . Dysrhythmia    irregular heartrate  . GERD (gastroesophageal reflux disease)   . Glaucoma   . Hypertension   . Neuropathy of foot    bilateral  . Osteoarthritis    osteoarthritis  . PTSD (post-traumatic stress disorder)   . PVD (peripheral vascular disease) (HCC) 2017  . Sleep apnea    has had surgery    MEDICATIONS:  Prior to Admission medications   Medication Sig Start Date End Date Taking? Authorizing Provider  albuterol (PROVENTIL HFA;VENTOLIN HFA) 108 (90 BASE) MCG/ACT inhaler Inhale 2 puffs into the lungs every 6 (six) hours as needed for wheezing or shortness of breath.    Yes [provider]  aspirin 81 MG EC tablet Take 81 mg by mouth daily. Swallow whole.   Yes [provider]  brimonidine-timolol (COMBIGAN) 0.2-0.5 % ophthalmic solution Place 1 drop into both eyes every 12 (twelve) hours.    Yes [provider]  clonazePAM (KLONOPIN) 1 MG tablet Take 1 tablet (1 mg total) by mouth at bedtime as needed for anxiety. Patient taking differently: Take 1 mg by mouth 3 (three) times daily.  11/05/16  Yes Hedges, Tinnie Gens, PA-C  desvenlafaxine (PRISTIQ) 100 MG 24 hr tablet Take 100 mg by mouth daily.   Yes [provider]  latanoprost (XALATAN) 0.005 % ophthalmic solution Place 1 drop into both eyes at bedtime.    Yes [provider]  linaclotide Karlene Einstein)  145 MCG CAPS capsule Take 145 mcg by mouth daily as needed (ibs/ constipation).    Yes [provider]  lisinopril-hydrochlorothiazide (PRINZIDE,ZESTORETIC) 20-12.5 MG per tablet Take 1 tablet by mouth 2 (two) times daily.    Yes [provider]  Lurasidone HCl (LATUDA) 60 MG TABS Take 60 mg by mouth at bedtime.   Yes [provider]  metoprolol succinate (TOPROL-XL) 50 MG 24 hr tablet Take 50 mg by mouth daily. Take with or immediately following a meal.    Yes [provider]  Omega-3  Fatty Acids (FISH OIL) 1200 MG CAPS Take 1,200 mg by mouth 2 (two) times daily.    Yes [provider]  omeprazole (PRILOSEC) 20 MG capsule Take 40 mg by mouth daily.    Yes [provider]  tiZANidine (ZANAFLEX) 4 MG tablet Take 4 mg by mouth every 8 (eight) hours as needed for muscle spasms.  12/21/16  Yes [provider]  traMADol (ULTRAM) 50 MG tablet Take 50 mg by mouth every 6 (six) hours as needed for moderate pain or severe pain.  11/22/16  Yes [provider]  umeclidinium bromide (INCRUSE ELLIPTA) 62.5 MCG/INH AEPB Inhale 1 puff into the lungs daily.   Yes [provider]  Vitamin D, Ergocalciferol, (DRISDOL) 50000 units CAPS capsule Take 50,000 Units by mouth every Monday.  06/16/16  Yes [provider]  bismuth subsalicylate (PEPTO-BISMOL) 262 MG/15ML suspension Take 30 mLs by mouth every 4 (four) hours as needed for indigestion. Patient not taking: Reported on 06/15/2017 12/25/16   Lyndal PulleyKnott, Daniel, MD  ibuprofen (ADVIL,MOTRIN) 800 MG tablet Take 1 tablet (800 mg total) by mouth 3 (three) times daily. Patient not taking: Reported on 10/26/2017 08/20/16   Dione BoozeGlick, David, MD  ondansetron (ZOFRAN ODT) 4 MG disintegrating tablet Take 1 tablet (4 mg total) by mouth every 8 (eight) hours as needed for nausea or vomiting. Patient not taking: Reported on 06/15/2017 12/25/16   Lyndal PulleyKnott, Daniel, MD  oxyCODONE (OXY IR/ROXICODONE) 5 MG immediate release tablet Take 1 tablet (5 mg total) by mouth every 6 (six) hours as needed for breakthrough pain. Patient not taking: Reported on 10/26/2017 06/21/17   Coletta Memosabbell, Kyle, MD  oxyCODONE-acetaminophen (PERCOCET) 7.5-325 MG tablet Take 1 tablet by mouth every 4 (four) hours as needed for severe pain. Patient not taking: Reported on 11/14/2016 08/20/16   Dione BoozeGlick, David, MD  tiZANidine (ZANAFLEX) 4 MG capsule Take 1 capsule (4 mg total) by mouth 3 (three) times daily. Patient not taking: Reported on 12/25/2016 02/23/15   Arby BarrettePfeiffer,  Marcy, MD  tiZANidine (ZANAFLEX) 4 MG tablet Take 1 tablet (4 mg total) by mouth every 6 (six) hours as needed for muscle spasms. Patient not taking: Reported on 10/26/2017 06/21/17   Coletta Memosabbell, Kyle, MD    ALLERGIES:  Allergies  Allergen Reactions  . Seroquel [Quetiapine] Other (See Comments)    HALLUCINATIONS  . Codeine Hives and Nausea And Vomiting  . Diclofenac Palpitations and Other (See Comments)    Other pains  . Flagyl [Metronidazole] Other (See Comments)    Crazy   . Imipramine Rash  . Meloxicam Itching  . Metoprolol Tartrate Itching and Rash  . Olanzapine Other (See Comments)    Patient preference   . Other Rash    Oil of olay moisturizing cream and eye cream caused measles like rash  . Vistaril [Hydroxyzine Hcl] Itching and Rash    SOCIAL HISTORY:  Social History   Tobacco Use  . Smoking status: Current Every  Day Smoker    Packs/day: 0.50    Years: 20.00    Pack years: 10.00    Types: Cigarettes  . Smokeless tobacco: Never Used  Substance Use Topics  . Alcohol use: Yes    Comment: wine on holidays now    FAMILY HISTORY: Family History  Problem Relation Age of Onset  . Other Mother        natural  . Heart attack Father   . Other Sister        kidney transplant  . Alcoholism Brother   . Goiter Sister   . Diabetes Brother   . Diabetes Sister     EXAM: BP (!) 92/57   Pulse 65   Temp 97.9 F (36.6 C) (Oral)   Resp 13   SpO2 100%  CONSTITUTIONAL: Alert and oriented and responds appropriately to questions. Well-appearing; well-nourished, afebrile, nontoxic, does not appear dehydrated, smiling, appropriate, in no distress HEAD: Normocephalic EYES: Conjunctivae clear, pupils appear equal, EOMI ENT: normal nose; moist mucous membranes NECK: Supple, no meningismus, no nuchal rigidity, no LAD  CARD: RRR; S1 and S2 appreciated; no murmurs, no clicks, no rubs, no gallops RESP: Normal chest excursion without splinting or tachypnea; breath sounds clear and  equal bilaterally; no wheezes, no rhonchi, no rales, no hypoxia or respiratory distress, speaking full sentences ABD/GI: Normal bowel sounds; non-distended; soft, non-tender, no rebound, no guarding, no peritoneal signs, no hepatosplenomegaly GU:  Normal external genitalia. No lesions, rashes noted. Patient has no vaginal bleeding on exam.  No significant vaginal discharge.  No pain with digital exam.  Absent cervix.  Chaperone present for exam. BACK:  The back appears normal and is non-tender to palpation, there is no CVA tenderness EXT: Normal ROM in all joints; non-tender to palpation; no edema; normal capillary refill; no cyanosis, no calf tenderness or swelling    SKIN: Normal color for age and race; warm; no rash NEURO: Moves all extremities equally PSYCH: The patient's mood and manner are appropriate. Grooming and personal hygiene are appropriate.  MEDICAL DECISION MAKING: Patient here with very mild intermittent vaginal bleeding for the past several days.  States her primary care physician told her to come to the emergency department.  She does not currently have an OB/GYN.  Her abdominal exam is benign.  There is no sign of bleeding whatsoever on exam.  Urine shows no blood or sign of infection.  Wet prep is positive for clue cells but she has no significant vaginal discharge, itching, burning or odor.  I do not feel this needs to be treated at this time.  I doubt that this is the cause of her vaginal bleeding.  She has no signs of vaginitis.  Her hemoglobin is normal.  She is not hemorrhaging and I do not think this is the cause of her low blood pressure.  I feel her low blood pressure is likely from the addition of a blood pressure medication.  Have advised her to go back to lisinopril and HCTZ once a day instead of twice a day.  Will give IV fluids here for her lightheadedness and reassess.  Electrolytes, creatinine unremarkable.  Doubt sepsis.  ED PROGRESS: Blood pressure improving with IV  hydration.  Have recommended she decrease her lisinopril and HCTZ back to once a day and have close follow-up with her PCP.  We will also give OB/GYN follow-up.  I do not feel at this time she needs further emergent workup.  Patient is comfortable with this plan.  Blood pressure currently 115/72.   At this time, I do not feel there is any life-threatening condition present. I have reviewed and discussed all results (EKG, imaging, lab, urine as appropriate) and exam findings with patient/family. I have reviewed nursing notes and appropriate previous records.  I feel the patient is safe to be discharged home without further emergent workup and can continue workup as an outpatient as needed. Discussed usual and customary return precautions. Patient/family verbalize understanding and are comfortable with this plan.  Outpatient follow-up has been provided if needed. All questions have been answered.      Ward, Layla Maw, DO 10/27/17 0119

## 2017-10-26 NOTE — ED Notes (Signed)
Pelvic cart at bedside. 

## 2017-10-26 NOTE — ED Triage Notes (Signed)
Pt to ER reports 2 days of vaginal bleeding; states hysterectomy 40+ years ago. States significantly fatigued as well over the last 2 days. NAD at triage. States is scared. VSS. A/o x4.

## 2017-10-27 DIAGNOSIS — N939 Abnormal uterine and vaginal bleeding, unspecified: Secondary | ICD-10-CM | POA: Diagnosis not present

## 2017-10-27 LAB — WET PREP, GENITAL
SPERM: NONE SEEN
Trich, Wet Prep: NONE SEEN
Yeast Wet Prep HPF POC: NONE SEEN

## 2017-10-27 LAB — GC/CHLAMYDIA PROBE AMP (~~LOC~~) NOT AT ARMC
Chlamydia: NEGATIVE
NEISSERIA GONORRHEA: NEGATIVE

## 2017-10-27 NOTE — ED Notes (Signed)
RN attempted x 2 for IV start; 2nd RN to start IV-Monique,RN

## 2017-10-27 NOTE — Discharge Instructions (Signed)
I recommend that you decrease your lisinopril and HCTZ back down to once a day and follow-up closely with your primary care doctor for your blood pressure.  It was low today but increased significantly with IV hydration.  Recommended to increase your water intake at home.  Your blood work was normal including normal blood counts.  We did not see any sign of infection in your urine or vaginal swabs.  There was no active bleeding today but if you continue to have vaginal bleeding I recommend follow-up with a gynecologist.  Your vaginal bleeding today could have been from irritation, atrophy of tissues from loss of estrogen.  No sign of infection or other lesions present on exam.  If you develop severe abdominal pain, fever, bleeding and or soaking through more than 1 pad an hour for more than 2 straight hours, feel like you may pass out or do pass out, please return to the emergency department.    [facility] [email or web address] [address] [place], [place] [phone]    [facility] [email or web address] [address] [place], [place] [phone]    [facility] [address] [place], [place] [address] [phone]   [facility] [email or web address] [address] [place], [place] [phone]   [facility] [email or web address] [address] [place], [place] [phone]   [facility] [email or web address] [address] [place], [place] [phone]

## 2018-03-08 ENCOUNTER — Other Ambulatory Visit: Payer: Self-pay | Admitting: Internal Medicine

## 2018-03-08 DIAGNOSIS — Z1231 Encounter for screening mammogram for malignant neoplasm of breast: Secondary | ICD-10-CM

## 2018-04-19 ENCOUNTER — Ambulatory Visit: Payer: Self-pay

## 2018-04-29 ENCOUNTER — Ambulatory Visit
Admission: RE | Admit: 2018-04-29 | Discharge: 2018-04-29 | Disposition: A | Discharge status: Home / Self Care | Payer: Medicare Other | Source: Ambulatory Visit | Attending: Internal Medicine | Admitting: Internal Medicine

## 2018-04-29 DIAGNOSIS — Z1231 Encounter for screening mammogram for malignant neoplasm of breast: Secondary | ICD-10-CM

## 2018-08-23 ENCOUNTER — Other Ambulatory Visit: Payer: Self-pay | Admitting: Internal Medicine

## 2018-08-23 DIAGNOSIS — E2839 Other primary ovarian failure: Secondary | ICD-10-CM

## 2018-09-16 ENCOUNTER — Other Ambulatory Visit: Payer: Self-pay

## 2018-10-01 ENCOUNTER — Ambulatory Visit
Admission: RE | Admit: 2018-10-01 | Discharge: 2018-10-01 | Disposition: A | Discharge status: Home / Self Care | Payer: Medicare Other | Source: Ambulatory Visit | Attending: Internal Medicine | Admitting: Internal Medicine

## 2018-10-01 ENCOUNTER — Other Ambulatory Visit: Payer: Self-pay | Admitting: Internal Medicine

## 2018-10-01 DIAGNOSIS — E2839 Other primary ovarian failure: Secondary | ICD-10-CM

## 2018-10-02 ENCOUNTER — Encounter: Payer: Self-pay | Admitting: Physical Therapy

## 2018-10-02 ENCOUNTER — Ambulatory Visit: Payer: Medicare Other | Attending: Neurosurgery | Admitting: Physical Therapy

## 2018-10-02 ENCOUNTER — Other Ambulatory Visit: Payer: Self-pay

## 2018-10-02 DIAGNOSIS — R262 Difficulty in walking, not elsewhere classified: Secondary | ICD-10-CM | POA: Diagnosis present

## 2018-10-02 DIAGNOSIS — M5442 Lumbago with sciatica, left side: Secondary | ICD-10-CM | POA: Diagnosis present

## 2018-10-02 DIAGNOSIS — M62830 Muscle spasm of back: Secondary | ICD-10-CM | POA: Diagnosis present

## 2018-10-02 DIAGNOSIS — M542 Cervicalgia: Secondary | ICD-10-CM | POA: Diagnosis present

## 2018-10-02 NOTE — Therapy (Signed)
[place], [place], [address] Phone: [phone]   Fax:  [phone]  Physical Therapy Evaluation  Patient Details  Name: Cassie Harrell MRN: [id number] Date of Birth: 11/29/1950 Referring Provider (PT): Cabbell   Encounter Date: 10/02/2018  PT End of Session - 10/02/18 1328    Visit Number  1    Date for PT Re-Evaluation  12/03/18    PT Start Time  1302    PT Stop Time  1350    PT Time Calculation (min)  48 min    Activity Tolerance  Patient limited by pain    Behavior During Therapy  Flat affect       Past Medical History:  Diagnosis Date  . Alcohol abuse with alcohol-induced mood disorder ([place]) 07/31/2016  . Anxiety   . Bipolar 1 disorder ([place])   . Cocaine abuse ([place])   . Constipation   . COPD (chronic obstructive pulmonary disease) ([place])   . Dissociative disorder   . Dysrhythmia    irregular heartrate  . GERD (gastroesophageal reflux disease)   . Glaucoma   . Hypertension   . Neuropathy of foot    bilateral  . Osteoarthritis    osteoarthritis  . PTSD (post-traumatic stress disorder)   . PVD (peripheral vascular disease) ([place]) 2017  . Sleep apnea    has had surgery    Past Surgical History:  Procedure Laterality Date  . ABDOMINAL HYSTERECTOMY    . APPENDECTOMY    . CESAREAN SECTION    . COLONOSCOPY    . EYE SURGERY    . FINGER SURGERY    . LUMBAR LAMINECTOMY/DECOMPRESSION MICRODISCECTOMY Left 06/20/2017   Procedure: MICRODISCECTOMY LUMBAR 4- LUMBAR 5 LEFT;  Surgeon: Ashok Pall, MD;  Location: [facility];  Service: Neurosurgery;  Laterality: Left;  MICRODISCECTOMY LUMBAR 4- LUMBAR 5 LEFT  . REVISION OF SCAR TISSUE RECTUS MUSCLE    . TONSILLECTOMY    . UVULOPALATOPHARYNGOPLASTY      There were no vitals filed for this visit.   Subjective Assessment - 10/02/18 1306    Subjective  Patient had lumbar surgery 06/2017, she had ACDF in August 2019.  She reports that she  feels that she has not had any decrease in pain, she has a myriad of c/o pain in the neck, back, shoulders and hips.  She reports that she has to take pain medicine daily.    Limitations  Lifting;Walking;House hold activities    Patient Stated Goals  have less pain    Currently in Pain?  Yes    Pain Score  6     Pain Location  Neck   back, hips and shoulders   Pain Descriptors / Indicators  Aching;Stabbing    Pain Type  Chronic pain    Pain Radiating Towards  c/o pain in the arms and legs, c/o numbness and tingling in the fingers and the legs    Pain Onset  More than a month ago    Pain Frequency  Constant    Aggravating Factors   walking, lifting, bending pain up to 10/10    Pain Relieving Factors  pain medication, helps, reports that oxycontin and oxycodone decrease pain to a 1/10    Effect of Pain on Daily Activities  limits everything         [facility] PT Assessment - 10/02/18 0001      Assessment   Medical Diagnosis  neck and back pain  Referring Provider (PT)  Cabbell    Onset Date/Surgical Date  06/02/18    Prior Therapy  no      Precautions   Precautions  None      Balance Screen   Has the patient fallen in the past 6 months  No    Has the patient had a decrease in activity level because of a fear of falling?   No    Is the patient reluctant to leave their home because of a fear of falling?   No      Home Environment   Additional Comments  has some stairs up to apartment, reports someone helps with housework      Prior Function   Level of Independence  Independent with household mobility without device   reports uses a SPC at times   Vocation  On disability    Leisure  no exercise      Posture/Postural Control   Posture Comments  fwd head, gaurded motions      ROM / Strength   AROM / PROM / Strength  AROM;Strength      AROM   Overall AROM Comments  Cervical ROM decreased 75% with pain, lumbar ROM decreased 50% with pain      Strength   Overall Strength  Comments  arms 4-/5, hips and knees 3+/5 with some c/o pain      Flexibility   Soft Tissue Assessment /Muscle Length  yes    Hamstrings  tight gaurded to all motions and c/o pain with all flexibility testing      Palpation   Palpation comment  very tight in the upper traps and neck, tight in the lumbar       Ambulation/Gait   Gait Comments  reports that at times she has to use a cane due to hip and back pain                Objective measurements completed on examination: See above findings.      [facility] Adult PT Treatment/Exercise - 10/02/18 0001      Modalities   Modalities  Electrical Stimulation;Moist Heat      Moist Heat Therapy   Number Minutes Moist Heat  15 Minutes    Moist Heat Location  Lumbar Spine;Cervical      Electrical Stimulation   Electrical Stimulation Location  left upper trap and neck area    Electrical Stimulation Action  IFC    Electrical Stimulation Parameters  supine    Electrical Stimulation Goals  Pain             PT Education - 10/02/18 1327    Education Details  HEP for shrugs and scapular retraction    Person(s) Educated  Patient    Methods  Explanation;Demonstration;Handout    Comprehension  Verbalized understanding       PT Short Term Goals - 10/02/18 1332      PT SHORT TERM GOAL #1   Title  independent iwth initial HEP    Time  2    Period  Weeks    Status  New        PT Long Term Goals - 10/02/18 1332      PT LONG TERM GOAL #1   Title  understand proper posture and body mechanics    Time  8    Period  Weeks    Status  New      PT LONG TERM GOAL #2   Title  decrease  pain 50%    Period  Weeks    Status  New      PT LONG TERM GOAL #3   Title  increase cervical ROM 25%    Time  8    Period  Weeks    Status  New      PT LONG TERM GOAL #4   Title  go up and down stairs step over step    Time  8    Period  Weeks    Status  New             Plan - 10/02/18 1328    Clinical Impression Statement   Patient reports that she had a lumbar surgery in 2018 and a cervical fusion in August 2019.  She reports significant pain in the neck, shoulders, back and legs, reports neck is worse.  Reports difficulty with purse on her shoulder, that she cannot lift anything and reports that at times she has to use a cane to walk.  Her cervical ROM is decreaed 75%, lumbar ROM decreased 50%    Clinical Presentation  Evolving    Clinical Decision Making  Moderate    Rehab Potential  Fair    PT Frequency  2x / week    PT Duration  8 weeks    PT Treatment/Interventions  ADLs/Self Care Home Management;Electrical Stimulation;Moist Heat;Ultrasound;Therapeutic activities;Therapeutic exercise;Patient/family education;Neuromuscular re-education;Manual techniques;Dry needling    PT Next Visit Plan  slowly start exercises to get her moving    Consulted and Agree with Plan of Care  Patient       Patient will benefit from skilled therapeutic intervention in order to improve the following deficits and impairments:  Cardiopulmonary status limiting activity, Decreased activity tolerance, Impaired flexibility, Decreased strength, Decreased range of motion, Decreased mobility, Increased muscle spasms, Pain, Improper body mechanics  Visit Diagnosis: Cervicalgia - Plan: PT plan of care cert/re-cert  Acute bilateral low back pain with left-sided sciatica - Plan: PT plan of care cert/re-cert  Muscle spasm of back - Plan: PT plan of care cert/re-cert  Difficulty in walking, not elsewhere classified - Plan: PT plan of care cert/re-cert     Problem List Patient Active Problem List   Diagnosis Date Noted  . HNP (herniated nucleus pulposus), lumbar 06/20/2017  . Alcohol abuse with alcohol-induced mood disorder ([facility]) 07/31/2016  . Bipolar disorder with depression ([place]) 06/28/2016  . Chest pain 06/09/2014  . CAP (community acquired pneumonia) 06/09/2014  . COPD (chronic obstructive pulmonary disease) ([place])   . Fibromyalgia    . Bipolar 1 disorder ([place])   . GERD (gastroesophageal reflux disease)     Cassie Harrell W.,PT 10/02/2018, 1:35 PM  [place], [place], [address] Phone: [phone]   Fax:  [phone]  Name: Akili Cuda MRN: [id number] Date of Birth: 10/31/1950

## 2018-10-17 ENCOUNTER — Ambulatory Visit: Payer: Medicare Other | Attending: Neurosurgery | Admitting: Physical Therapy

## 2018-10-17 ENCOUNTER — Encounter: Payer: Self-pay | Admitting: Physical Therapy

## 2018-10-17 DIAGNOSIS — R262 Difficulty in walking, not elsewhere classified: Secondary | ICD-10-CM | POA: Insufficient documentation

## 2018-10-17 DIAGNOSIS — M542 Cervicalgia: Secondary | ICD-10-CM | POA: Insufficient documentation

## 2018-10-17 DIAGNOSIS — M62830 Muscle spasm of back: Secondary | ICD-10-CM | POA: Insufficient documentation

## 2018-10-17 DIAGNOSIS — M5442 Lumbago with sciatica, left side: Secondary | ICD-10-CM | POA: Diagnosis present

## 2018-10-17 NOTE — Therapy (Signed)
[place], [place], [address] Phone: [phone]   Fax:  [phone]  Physical Therapy Treatment  Patient Details  Name: Cassie Harrell MRN: [id number] Date of Birth: 10-Feb-1951 Referring Provider (PT): Cabbell   Encounter Date: 10/17/2018  PT End of Session - 10/17/18 1316    Visit Number  2    Date for PT Re-Evaluation  12/03/18    PT Start Time  5456    PT Stop Time  1332    PT Time Calculation (min)  57 min    Activity Tolerance  Patient limited by pain    Behavior During Therapy  [facility] for tasks assessed/performed       Past Medical History:  Diagnosis Date  . Alcohol abuse with alcohol-induced mood disorder ([place]) 07/31/2016  . Anxiety   . Bipolar 1 disorder ([place])   . Cocaine abuse ([place])   . Constipation   . COPD (chronic obstructive pulmonary disease) ([place])   . Dissociative disorder   . Dysrhythmia    irregular heartrate  . GERD (gastroesophageal reflux disease)   . Glaucoma   . Hypertension   . Neuropathy of foot    bilateral  . Osteoarthritis    osteoarthritis  . PTSD (post-traumatic stress disorder)   . PVD (peripheral vascular disease) ([place]) 2017  . Sleep apnea    has had surgery    Past Surgical History:  Procedure Laterality Date  . ABDOMINAL HYSTERECTOMY    . APPENDECTOMY    . CESAREAN SECTION    . COLONOSCOPY    . EYE SURGERY    . FINGER SURGERY    . LUMBAR LAMINECTOMY/DECOMPRESSION MICRODISCECTOMY Left 06/20/2017   Procedure: MICRODISCECTOMY LUMBAR 4- LUMBAR 5 LEFT;  Surgeon: Ashok Pall, MD;  Location: [facility];  Service: Neurosurgery;  Laterality: Left;  MICRODISCECTOMY LUMBAR 4- LUMBAR 5 LEFT  . REVISION OF SCAR TISSUE RECTUS MUSCLE    . TONSILLECTOMY    . UVULOPALATOPHARYNGOPLASTY      There were no vitals filed for this visit.  Subjective Assessment - 10/17/18 1241    Subjective  Patient reports that the estim helped for a while reports that she  has to take pain meds due to pain, prior to pain medication her pain is rated an 8/10    Currently in Pain?  Yes    Pain Score  6     Pain Location  Neck   back   Aggravating Factors   stress and activity, worse sometimes in the morning                       [facility] Adult PT Treatment/Exercise - 10/17/18 0001      Exercises   Exercises  Neck      Neck Exercises: Machines for Strengthening   UBE (Upper Arm Bike)  Level 1 x 4 minutes    Nustep  level 3 x 5 minutes      Neck Exercises: Theraband   Shoulder Extension  20 reps;Red    Rows  20 reps;Red    Shoulder External Rotation  20 reps;Red      Neck Exercises: Standing   Neck Retraction  20 reps    Neck Retraction Limitations  pillow behind head    Wall Wash  flexion both arms at the same time, scaption both arms    Other Standing Exercises  W [employer] [place] exercises with back to wall  Other Standing Exercises  gentle AROM of the cervical spine      Neck Exercises: Seated   Shoulder Shrugs  20 reps    Shoulder Rolls  20 reps      Modalities   Modalities  Electrical Stimulation;Moist Heat      Moist Heat Therapy   Number Minutes Moist Heat  15 Minutes    Moist Heat Location  Lumbar Spine;Cervical      Electrical Stimulation   Electrical Stimulation Location  C/T area    Electrical Stimulation Action  IFC    Electrical Stimulation Parameters  supine    Electrical Stimulation Goals  Pain               PT Short Term Goals - 10/17/18 1318      PT SHORT TERM GOAL #1   Title  independent iwth initial HEP    Status  Partially Met        PT Long Term Goals - 10/02/18 1332      PT LONG TERM GOAL #1   Title  understand proper posture and body mechanics    Time  8    Period  Weeks    Status  New      PT LONG TERM GOAL #2   Title  decrease pain 50%    Period  Weeks    Status  New      PT LONG TERM GOAL #3   Title  increase cervical ROM 25%    Time  8    Period  Weeks    Status  New       PT LONG TERM GOAL #4   Title  go up and down stairs step over step    Time  8    Period  Weeks    Status  New            Plan - 10/17/18 1317    Clinical Impression Statement  Patient was hesitant for exercises and unsure about them, she was cooperative and did well with cues, had pain with some the the exercises in the shoulders.  She has a lot of spasms in the upper traps and th erhomboid area    PT Next Visit Plan  continue to slowly start the exercises and see if we can help with the spasms    Consulted and Agree with Plan of Care  Patient       Patient will benefit from skilled therapeutic intervention in order to improve the following deficits and impairments:  Cardiopulmonary status limiting activity, Decreased activity tolerance, Impaired flexibility, Decreased strength, Decreased range of motion, Decreased mobility, Increased muscle spasms, Pain, Improper body mechanics  Visit Diagnosis: Cervicalgia  Acute bilateral low back pain with left-sided sciatica  Muscle spasm of back  Difficulty in walking, not elsewhere classified     Problem List Patient Active Problem List   Diagnosis Date Noted  . HNP (herniated nucleus pulposus), lumbar 06/20/2017  . Alcohol abuse with alcohol-induced mood disorder ([place]) 07/31/2016  . Bipolar disorder with depression ([place]) 06/28/2016  . Chest pain 06/09/2014  . CAP (community acquired pneumonia) 06/09/2014  . COPD (chronic obstructive pulmonary disease) ([place])   . Fibromyalgia   . Bipolar 1 disorder ([place])   . GERD (gastroesophageal reflux disease)     Sumner Boast., PT 10/17/2018, 1:18 PM  [place] [address] [place], [place], [address] Phone: [phone]   Fax:  [phone]  Name: Cassie Harrell MRN: [id number] Date of Birth: [birth year]

## 2018-10-22 ENCOUNTER — Ambulatory Visit: Payer: Medicare Other | Admitting: Physical Therapy

## 2018-10-23 ENCOUNTER — Ambulatory Visit: Payer: Medicare Other | Admitting: Physical Therapy

## 2018-10-23 ENCOUNTER — Encounter: Payer: Self-pay | Admitting: Physical Therapy

## 2018-10-23 DIAGNOSIS — R262 Difficulty in walking, not elsewhere classified: Secondary | ICD-10-CM

## 2018-10-23 DIAGNOSIS — M5442 Lumbago with sciatica, left side: Secondary | ICD-10-CM

## 2018-10-23 DIAGNOSIS — M62830 Muscle spasm of back: Secondary | ICD-10-CM

## 2018-10-23 DIAGNOSIS — M542 Cervicalgia: Secondary | ICD-10-CM

## 2018-10-23 NOTE — Therapy (Signed)
[place], [place], [address] Phone: [phone]   Fax:  [phone]  Physical Therapy Treatment  Patient Details  Name: Cassie Harrell MRN: [id number] Date of Birth: 08-21-1951 Referring Provider (PT): Cabbell   Encounter Date: 10/23/2018  PT End of Session - 10/23/18 1337    Visit Number  3    Date for PT Re-Evaluation  12/03/18    PT Start Time  1258    PT Stop Time  1352    PT Time Calculation (min)  54 min    Activity Tolerance  Patient limited by pain    Behavior During Therapy  [facility] for tasks assessed/performed       Past Medical History:  Diagnosis Date  . Alcohol abuse with alcohol-induced mood disorder ([place]) 07/31/2016  . Anxiety   . Bipolar 1 disorder ([place])   . Cocaine abuse ([place])   . Constipation   . COPD (chronic obstructive pulmonary disease) ([place])   . Dissociative disorder   . Dysrhythmia    irregular heartrate  . GERD (gastroesophageal reflux disease)   . Glaucoma   . Hypertension   . Neuropathy of foot    bilateral  . Osteoarthritis    osteoarthritis  . PTSD (post-traumatic stress disorder)   . PVD (peripheral vascular disease) ([place]) 2017  . Sleep apnea    has had surgery    Past Surgical History:  Procedure Laterality Date  . ABDOMINAL HYSTERECTOMY    . APPENDECTOMY    . CESAREAN SECTION    . COLONOSCOPY    . EYE SURGERY    . FINGER SURGERY    . LUMBAR LAMINECTOMY/DECOMPRESSION MICRODISCECTOMY Left 06/20/2017   Procedure: MICRODISCECTOMY LUMBAR 4- LUMBAR 5 LEFT;  Surgeon: Ashok Pall, MD;  Location: [facility];  Service: Neurosurgery;  Laterality: Left;  MICRODISCECTOMY LUMBAR 4- LUMBAR 5 LEFT  . REVISION OF SCAR TISSUE RECTUS MUSCLE    . TONSILLECTOMY    . UVULOPALATOPHARYNGOPLASTY      There were no vitals filed for this visit.  Subjective Assessment - 10/23/18 1300    Subjective  "Im hurting"    Currently in Pain?  Yes    Pain Score  8     Pain  Location  Neck    Pain Orientation  Left                       [facility] Adult PT Treatment/Exercise - 10/23/18 0001      Exercises   Exercises  Neck      Neck Exercises: Machines for Strengthening   UBE (Upper Arm Bike)  Level 1 x 4 minutes    Nustep  level 3 x 6 minutes      Neck Exercises: Theraband   Shoulder Extension  20 reps;Red    Rows  20 reps;Red    Shoulder External Rotation  20 reps;Red      Neck Exercises: Standing   Neck Retraction  20 reps    Neck Retraction Limitations  pillow behind head    Other Standing Exercises  W xercises with back to wall      Neck Exercises: Seated   Shoulder Shrugs  20 reps      Modalities   Modalities  Electrical Stimulation;Moist Heat      Moist Heat Therapy   Number Minutes Moist Heat  15 Minutes      Electrical Stimulation   Electrical Stimulation Location  upper  L trap, Mid [place].     Child psychotherapist Parameters  supine    Electrical Stimulation Goals  Pain      Manual Therapy   Manual Therapy  Passive ROM;Soft tissue mobilization    Soft tissue mobilization  upper traps into rhomboids.    Passive ROM  gentle to the cervical spine               PT Short Term Goals - 10/17/18 1318      PT SHORT TERM GOAL #1   Title  independent iwth initial HEP    Status  Partially Met        PT Long Term Goals - 10/02/18 1332      PT LONG TERM GOAL #1   Title  understand proper posture and body mechanics    Time  8    Period  Weeks    Status  New      PT LONG TERM GOAL #2   Title  decrease pain 50%    Period  Weeks    Status  New      PT LONG TERM GOAL #3   Title  increase cervical ROM 25%    Time  8    Period  Weeks    Status  New      PT LONG TERM GOAL #4   Title  go up and down stairs step over step    Time  8    Period  Weeks    Status  New            Plan - 10/23/18 1338    Clinical Impression Statement  Pt requires max  encouragement to do the exercises. She reports only wanting e-stim and heat. She reports L shoulder pain throughout treatment. When doing the exercises she performs them well with cues. Positive response to MT.      Rehab Potential  Fair    PT Frequency  2x / week    PT Duration  8 weeks    PT Next Visit Plan  continue to slowly start the exercises and see if we can help with the spasms       Patient will benefit from skilled therapeutic intervention in order to improve the following deficits and impairments:  Cardiopulmonary status limiting activity, Decreased activity tolerance, Impaired flexibility, Decreased strength, Decreased range of motion, Decreased mobility, Increased muscle spasms, Pain, Improper body mechanics  Visit Diagnosis: Cervicalgia  Acute bilateral low back pain with left-sided sciatica  Muscle spasm of back  Difficulty in walking, not elsewhere classified     Problem List Patient Active Problem List   Diagnosis Date Noted  . HNP (herniated nucleus pulposus), lumbar 06/20/2017  . Alcohol abuse with alcohol-induced mood disorder ([place]) 07/31/2016  . Bipolar disorder with depression ([place]) 06/28/2016  . Chest pain 06/09/2014  . CAP (community acquired pneumonia) 06/09/2014  . COPD (chronic obstructive pulmonary disease) ([place])   . Fibromyalgia   . Bipolar 1 disorder ([place])   . GERD (gastroesophageal reflux disease)     Scot Jun, PTA 10/23/2018, 1:47 PM  [place], [place], [address] Phone: [phone]   Fax:  [phone]  Name: Cassie Harrell MRN: [id number] Date of Birth: 10/05/1951

## 2018-11-05 ENCOUNTER — Ambulatory Visit: Payer: Medicare Other | Admitting: Physical Therapy

## 2018-11-05 ENCOUNTER — Encounter: Payer: Self-pay | Admitting: Physical Therapy

## 2018-11-05 DIAGNOSIS — M5442 Lumbago with sciatica, left side: Secondary | ICD-10-CM

## 2018-11-05 DIAGNOSIS — M542 Cervicalgia: Secondary | ICD-10-CM | POA: Diagnosis not present

## 2018-11-05 DIAGNOSIS — M62830 Muscle spasm of back: Secondary | ICD-10-CM

## 2018-11-05 DIAGNOSIS — R262 Difficulty in walking, not elsewhere classified: Secondary | ICD-10-CM

## 2018-11-05 NOTE — Therapy (Signed)
[place], [place], [address] Phone: [phone]   Fax:  [phone]  Physical Therapy Treatment  Patient Details  Name: Cassie Harrell MRN: [id number] Date of Birth: 1951-09-23 Referring Provider (PT): Cabbell   Encounter Date: 11/05/2018  PT End of Session - 11/05/18 1134    Visit Number  4    Date for PT Re-Evaluation  12/03/18    PT Start Time  1100    PT Stop Time  1150    PT Time Calculation (min)  50 min    Activity Tolerance  Patient limited by pain    Behavior During Therapy  [facility] for tasks assessed/performed       Past Medical History:  Diagnosis Date  . Alcohol abuse with alcohol-induced mood disorder ([place]) 07/31/2016  . Anxiety   . Bipolar 1 disorder ([place])   . Cocaine abuse ([place])   . Constipation   . COPD (chronic obstructive pulmonary disease) ([place])   . Dissociative disorder   . Dysrhythmia    irregular heartrate  . GERD (gastroesophageal reflux disease)   . Glaucoma   . Hypertension   . Neuropathy of foot    bilateral  . Osteoarthritis    osteoarthritis  . PTSD (post-traumatic stress disorder)   . PVD (peripheral vascular disease) ([place]) 2017  . Sleep apnea    has had surgery    Past Surgical History:  Procedure Laterality Date  . ABDOMINAL HYSTERECTOMY    . APPENDECTOMY    . CESAREAN SECTION    . COLONOSCOPY    . EYE SURGERY    . FINGER SURGERY    . LUMBAR LAMINECTOMY/DECOMPRESSION MICRODISCECTOMY Left 06/20/2017   Procedure: MICRODISCECTOMY LUMBAR 4- LUMBAR 5 LEFT;  Surgeon: Ashok Pall, MD;  Location: [facility];  Service: Neurosurgery;  Laterality: Left;  MICRODISCECTOMY LUMBAR 4- LUMBAR 5 LEFT  . REVISION OF SCAR TISSUE RECTUS MUSCLE    . TONSILLECTOMY    . UVULOPALATOPHARYNGOPLASTY      There were no vitals filed for this visit.  Subjective Assessment - 11/05/18 1103    Subjective  "Most days I still have spasms"    Currently in Pain?  Yes    Pain  Score  7     Pain Location  --   L shoulder, Neck, and back                      [facility] Adult PT Treatment/Exercise - 11/05/18 0001      Neck Exercises: Machines for Strengthening   UBE (Upper Arm Bike)  Level 1 x5    Other Machines for Strengthening  Recumbant bike x4 min      Neck Exercises: Theraband   Shoulder Extension  20 reps;Red    Rows  20 reps;Green    Shoulder External Rotation  20 reps;Red    Other Theraband Exercises  OHP with yellow ball 2x10       Neck Exercises: Seated   Shoulder Shrugs  20 reps   4lb    Shoulder Rolls  20 reps    Other Seated Exercise  bicept curls 4lb 2x10      Modalities   Modalities  Electrical Stimulation;Moist Heat      Moist Heat Therapy   Number Minutes Moist Heat  15 Minutes    Moist Heat Location  Lumbar Spine;Cervical      Electrical Stimulation   Electrical Stimulation Location  upper L trap,  L lumbar spine    Electrical Stimulation Action  pre mod    Electrical Stimulation Parameters  supine    Electrical Stimulation Goals  Pain               PT Short Term Goals - 10/17/18 1318      PT SHORT TERM GOAL #1   Title  independent with initial HEP    Status  Met        PT Long Term Goals - 10/02/18 1332      PT LONG TERM GOAL #1   Title  understand proper posture and body mechanics    Time  8    Period  Weeks    Status  New      PT LONG TERM GOAL #2   Title  decrease pain 50%    Period  Weeks    Status  New      PT LONG TERM GOAL #3   Title  increase cervical ROM 25%    Time  8    Period  Weeks    Status  New      PT LONG TERM GOAL #4   Title  go up and down stairs step over step    Time  8    Period  Weeks    Status  New            Plan - 11/05/18 1137    Clinical Impression Statement  Again pt requires encouragement to do exercises. She reports that the only things that helps is heat, electrodes, and pills. Paint reported throughout session. Anterior L shoulder pain going  backwards on UBE. She reports hip pain with recumbent bike from bursitis. Increase resistance tolerated with standing Tows. Cues required to keep shoulders back.     Clinical Decision Making  Low    Rehab Potential  Fair    PT Frequency  2x / week    PT Duration  8 weeks    PT Treatment/Interventions  ADLs/Self Care Home Management;Electrical Stimulation;Moist Heat;Ultrasound;Therapeutic activities;Therapeutic exercise;Patient/family education;Neuromuscular re-education;Manual techniques;Dry needling    PT Next Visit Plan  encourage pt to continue  the exercises       Patient will benefit from skilled therapeutic intervention in order to improve the following deficits and impairments:  Cardiopulmonary status limiting activity, Decreased activity tolerance, Impaired flexibility, Decreased strength, Decreased range of motion, Decreased mobility, Increased muscle spasms, Pain, Improper body mechanics  Visit Diagnosis: Cervicalgia  Muscle spasm of back  Difficulty in walking, not elsewhere classified  Acute bilateral low back pain with left-sided sciatica     Problem List Patient Active Problem List   Diagnosis Date Noted  . HNP (herniated nucleus pulposus), lumbar 06/20/2017  . Alcohol abuse with alcohol-induced mood disorder ([place]) 07/31/2016  . Bipolar disorder with depression ([place]) 06/28/2016  . Chest pain 06/09/2014  . CAP (community acquired pneumonia) 06/09/2014  . COPD (chronic obstructive pulmonary disease) ([place])   . Fibromyalgia   . Bipolar 1 disorder ([place])   . GERD (gastroesophageal reflux disease)     Scot Jun, PTA 11/05/2018, 11:41 AM  [place], [place], [address] Phone: [phone]   Fax:  [phone]  Name: Cassie Harrell MRN: [id number] Date of Birth: [birth year]

## 2018-11-13 ENCOUNTER — Encounter: Payer: Self-pay | Admitting: Physical Therapy

## 2019-01-20 ENCOUNTER — Other Ambulatory Visit: Payer: Self-pay | Admitting: Cardiology

## 2019-03-24 ENCOUNTER — Other Ambulatory Visit: Payer: Self-pay | Admitting: Internal Medicine

## 2019-03-24 DIAGNOSIS — Z1231 Encounter for screening mammogram for malignant neoplasm of breast: Secondary | ICD-10-CM

## 2019-04-12 DIAGNOSIS — I471 Supraventricular tachycardia: Secondary | ICD-10-CM | POA: Insufficient documentation

## 2019-04-12 DIAGNOSIS — I119 Hypertensive heart disease without heart failure: Secondary | ICD-10-CM | POA: Insufficient documentation

## 2019-04-12 DIAGNOSIS — F17200 Nicotine dependence, unspecified, uncomplicated: Secondary | ICD-10-CM | POA: Insufficient documentation

## 2019-04-12 DIAGNOSIS — E7800 Pure hypercholesterolemia, unspecified: Secondary | ICD-10-CM | POA: Insufficient documentation

## 2019-04-12 NOTE — Progress Notes (Signed)
Subjective:  Primary Physician:  Nolene Ebbs, MD  Patient ID: Cassie Harrell, female    DOB: [birth year], 68 y.o.   MRN: [id number]  This visit type was conducted due to national recommendations for restrictions regarding the COVID-19 Pandemic (e.g. social distancing).  This format is felt to be most appropriate for this patient at this time.  All issues noted in this document were discussed and addressed.  No physical exam was performed (except for noted visual exam findings with Telehealth visits - very limited).  The patient has consented to conduct a Telehealth visit and understands insurance will be billed.   I connected with patient, on 04/15/19  by a telemedicine application and verified that I am speaking with the correct person using two identifiers.     I discussed the limitations of evaluation and management by telemedicine and the availability of in person appointments. The patient expressed understanding and agreed to proceed.   I have discussed with patient regarding the safety during COVID Pandemic and steps and precautions including social distancing with the patient.    Chief Complaint  Patient presents with  . Hypertension  . Follow-up    HPI: Cassie Harrell  is a 68 y.o. female, who comes for follow up of Hypertensive Heart Disease and PSVT. Ms. Vazques is African-American female. Patient is feeling well. She denies any palpitation. No dizziness, near-syncope or syncope. She did not have any spells of sudden fast heartbeat.  No complaints of chest pain, tightness or pressure. No complaints of shortness of breath, orthopnea or PND. No history of swelling on the legs and no claudication. No leg cramps  Patient has hypertension. No history of diabetes. She has h/o mildly high LDL cholesterol. No history of myocardial infarction. She still smokes half pack of cigarettes a day, has stopped drinking beer completely.  Patient also has history of COPD,  GERD, arthritis, sleep apnea and bipolar disorder. Venous duplex of lower extremities in September 2016 revealed a thrombosed varicose vein in the right posterolateral knee area. There was no DVT.  No history of thyroid problems. No history of TIA or CVA.  Past Medical History:  Diagnosis Date  . Alcohol abuse with alcohol-induced mood disorder ([place]) 07/31/2016  . Anxiety   . Bipolar 1 disorder ([place])   . Cocaine abuse ([place])   . Constipation   . COPD (chronic obstructive pulmonary disease) ([place])   . Dissociative disorder   . Dysrhythmia    irregular heartrate  . GERD (gastroesophageal reflux disease)   . Glaucoma   . Hypertension   . Neuropathy of foot    bilateral  . Osteoarthritis    osteoarthritis  . PTSD (post-traumatic stress disorder)   . PVD (peripheral vascular disease) ([place]) 2017  . Sleep apnea    has had surgery    Past Surgical History:  Procedure Laterality Date  . ABDOMINAL HYSTERECTOMY    . APPENDECTOMY    . CESAREAN SECTION    . COLONOSCOPY    . EYE SURGERY    . FINGER SURGERY    . LUMBAR LAMINECTOMY/DECOMPRESSION MICRODISCECTOMY Left 06/20/2017   Procedure: MICRODISCECTOMY LUMBAR 4- LUMBAR 5 LEFT;  Surgeon: Ashok Pall, MD;  Location: [facility];  Service: Neurosurgery;  Laterality: Left;  MICRODISCECTOMY LUMBAR 4- LUMBAR 5 LEFT  . REVISION OF SCAR TISSUE RECTUS MUSCLE    . TONSILLECTOMY    . UVULOPALATOPHARYNGOPLASTY      Social History   Socioeconomic History  . Marital status: Single  Spouse name: Not on file  . Number of children: 1  . Years of education: College  . Highest education level: Not on file  Occupational History    Employer: OTHER    Comment: disabled  Social Needs  . Financial resource strain: Not on file  . Food insecurity    Worry: Not on file    Inability: Not on file  . Transportation needs    Medical: Not on file    Non-medical: Not on file  Tobacco Use  . Smoking status: Current Every Day Smoker    Packs/day: 0.25     Years: 20.00    Pack years: 5.00    Types: Cigarettes  . Smokeless tobacco: Never Used  Substance and Sexual Activity  . Alcohol use: Not Currently  . Drug use: Yes    Types: Cocaine    Comment: none since 2015/16  . Sexual activity: Never  Lifestyle  . Physical activity    Days per week: Not on file    Minutes per session: Not on file  . Stress: Not on file  Relationships  . Social Musicianconnections    Talks on phone: Not on file    Gets together: Not on file    Attends religious service: Not on file    Active member of club or organization: Not on file    Attends meetings of clubs or organizations: Not on file    Relationship status: Not on file  . Intimate partner violence    Fear of current or ex partner: Not on file    Emotionally abused: Not on file    Physically abused: Not on file    Forced sexual activity: Not on file  Other Topics Concern  . Not on file  Social History Narrative   Patient lives at home alone.   Caffeine Use: quit 2 yrs ago    Current Outpatient Medications on File Prior to Visit  Medication Sig Dispense Refill  . albuterol (PROVENTIL HFA;VENTOLIN HFA) 108 (90 BASE) MCG/ACT inhaler Inhale 2 puffs into the lungs every 6 (six) hours as needed for wheezing or shortness of breath.     . brimonidine-timolol (COMBIGAN) 0.2-0.5 % ophthalmic solution Place 1 drop into both eyes every 12 (twelve) hours.     Marland Kitchen. desvenlafaxine (PRISTIQ) 100 MG 24 hr tablet Take 75 mg by mouth daily.     Marland Kitchen. ibuprofen (ADVIL,MOTRIN) 800 MG tablet Take 1 tablet (800 mg total) by mouth 3 (three) times daily. 30 tablet 0  . latanoprost (XALATAN) 0.005 % ophthalmic solution Place 1 drop into both eyes at bedtime.     Marland Kitchen. linaclotide (LINZESS) 145 MCG CAPS capsule Take 145 mcg by mouth daily as needed (ibs/ constipation).     Marland Kitchen. lisinopril-hydrochlorothiazide (PRINZIDE,ZESTORETIC) 20-12.5 MG per tablet Take 1 tablet by mouth daily.     . Lurasidone HCl (LATUDA) 60 MG TABS Take 60 mg by  mouth at bedtime.    . metoprolol succinate (TOPROL-XL) 25 MG 24 hr tablet TAKE 1 TABLET BY MOUTH EVERY DAY 90 tablet 1  . Omega-3 Fatty Acids (FISH OIL) 1200 MG CAPS Take 1,200 mg by mouth daily.     Marland Kitchen. omeprazole (PRILOSEC) 20 MG capsule Take 40 mg by mouth daily.     Marland Kitchen. umeclidinium bromide (INCRUSE ELLIPTA) 62.5 MCG/INH AEPB Inhale 1 puff into the lungs daily.    . Vitamin D, Ergocalciferol, (DRISDOL) 50000 units CAPS capsule Take 50,000 Units by mouth every Monday.   5  No current facility-administered medications on file prior to visit.     Review of Systems  Constitutional: Negative for fever.  HENT: Negative for nosebleeds.   Eyes: Negative for blurred vision.  Respiratory: Negative for cough and shortness of breath.   Cardiovascular: Negative for chest pain, palpitations and leg swelling.  Gastrointestinal: Negative for abdominal pain, nausea and vomiting.  Genitourinary: Negative for dysuria.  Musculoskeletal: Negative for myalgias.  Skin: Negative for itching and rash.  Neurological: Negative for dizziness, seizures and loss of consciousness.  Psychiatric/Behavioral: The patient is not nervous/anxious.        Objective:  Blood pressure 109/69, pulse (!) 57, height 5\' 3"  (1.6 m), weight 154 lb (69.9 kg). Body mass index is 27.28 kg/m.  Physical Exam: Patient is alert and oriented, appeared very comfortable talking to me during the visit. No further detailed physical examination was possible as it was a telemedicine visit.  CARDIAC STUDIES:  Echocardiogram 01/20/2016: Left ventricle cavity is normal in size. Moderate concentric hypertrophy of the left ventricle. Normal global wall motion. Doppler evidence of grade I (impaired) diastolic dysfunction. Calculated EF 61%. Trace mitral regurgitation. Mild tricuspid regurgitation. No evidence of pulmonary hypertension. Mild pulmonic regurgitation. Lexiscan myoview stress test 01/14/2016: 1. The resting electrocardiogram  demonstrated normal sinus rhythm LVH and no resting arrhythmias. Stress EKG is non-diagnostic for ischemia as it a pharmacologic stress using Lexiscan. Stress symptoms included dyspnea. 2. Myocardial perfusion imaging is normal. Overall left ventricular systolic function was normal without regional wall motion abnormalities. The left ventricular ejection fraction was 57%. Holter monitor- 12/31/15-01/13/16- Three short runs of PSVT. Longest run consisted of 16 beats. The fastest run had average heart rate of 133/m. Rare PACs, PVCs.  Assessment & Recommendations:   1. Hypertension with heart disease  2. PSVT (paroxysmal supraventricular tachycardia) (HCC)  3. Hypercholesterolemia  4. Smoking  Laboratory Exam:  CBC Latest Ref Rng & Units 10/26/2017 10/26/2017 06/20/2017  WBC 4.0 - 10.5 K/uL - 8.4 5.7  Hemoglobin 12.0 - 15.0 g/dL 16.113.9 09.614.1 04.513.8  Hematocrit 36.0 - 46.0 % 41.0 41.3 41.1  Platelets 150 - 400 K/uL - 293 262   CMP Latest Ref Rng & Units 10/26/2017 06/20/2017 11/05/2016  Glucose 65 - 99 mg/dL 409(W115(H) 99 119(J108(H)  BUN 6 - 20 mg/dL 20 9 11   Creatinine 0.44 - 1.00 mg/dL 4.78(G1.10(H) 9.560.81 2.130.75  Sodium 135 - 145 mmol/L 136 140 134(L)  Potassium 3.5 - 5.1 mmol/L 3.2(L) 3.8 3.6  Chloride 101 - 111 mmol/L 98(L) 105 104  CO2 22 - 32 mmol/L - 25 23  Calcium 8.9 - 10.3 mg/dL - 9.0 9.2  Total Protein 6.5 - 8.1 g/dL - 6.8 -  Total Bilirubin 0.3 - 1.2 mg/dL - 0.7 -  Alkaline Phos 38 - 126 U/L - 57 -  AST 15 - 41 U/L - 20 -  ALT 14 - 54 U/L - 14 -   Lipid Panel -08/23/2017-cholesterol-193, LDL-137, HDL-37, triglycerides-89. Normal liver enzymes.  Recommendation:  Blood pressure is well controlled with therapy. I have advised her to continue present medications.  Primary prevention was again discussed with the patient. She was advised to follow low-salt, low-cholesterol diet. She was again advised very strongly to quit smoking completely and continue to abstain from alcohol also completely.  Different ways to quit smoking were discussed.  Patient said that she will try nicotine gums.  I have cautioned her that she must not smoke while using the nicotine gums and the dangers of combining both on  the heart, blood pressure etc. were explained.  I will see her in follow-up after 6 months, but call us if there are any cardiac problems in the interim or blood pressure is uncontrolled. She will continue to have all the blood tests at her PCP's office.  Earl Manyhandra K Jayde Mcallister, MD, [facility] HospitalFACC 04/15/2019, 11:29 AM [facility] [place] Pager: [phone] Office: [phone] If no answer Cell [phone]

## 2019-04-15 ENCOUNTER — Encounter: Payer: Self-pay | Admitting: Cardiology

## 2019-04-15 ENCOUNTER — Other Ambulatory Visit: Payer: Self-pay

## 2019-04-15 ENCOUNTER — Ambulatory Visit (INDEPENDENT_AMBULATORY_CARE_PROVIDER_SITE_OTHER): Payer: Medicare Other | Admitting: Cardiology

## 2019-04-15 DIAGNOSIS — E7800 Pure hypercholesterolemia, unspecified: Secondary | ICD-10-CM | POA: Diagnosis not present

## 2019-04-15 DIAGNOSIS — I119 Hypertensive heart disease without heart failure: Secondary | ICD-10-CM

## 2019-04-15 DIAGNOSIS — F17200 Nicotine dependence, unspecified, uncomplicated: Secondary | ICD-10-CM | POA: Diagnosis not present

## 2019-04-15 DIAGNOSIS — I471 Supraventricular tachycardia: Secondary | ICD-10-CM | POA: Diagnosis not present

## 2019-04-15 MED ORDER — METOPROLOL SUCCINATE ER 25 MG PO TB24: 25.0000 mg | ORAL_TABLET | Freq: Every day | ORAL | 2 refills | Status: DC

## 2019-05-09 ENCOUNTER — Ambulatory Visit: Payer: Medicare Other

## 2019-07-08 ENCOUNTER — Ambulatory Visit
Admission: RE | Admit: 2019-07-08 | Discharge: 2019-07-08 | Disposition: A | Discharge status: Home / Self Care | Payer: Medicare Other | Source: Ambulatory Visit | Attending: Internal Medicine | Admitting: Internal Medicine

## 2019-07-08 ENCOUNTER — Other Ambulatory Visit: Payer: Self-pay

## 2019-07-08 DIAGNOSIS — Z1231 Encounter for screening mammogram for malignant neoplasm of breast: Secondary | ICD-10-CM

## 2019-09-13 ENCOUNTER — Other Ambulatory Visit: Payer: Self-pay

## 2019-09-13 ENCOUNTER — Encounter (HOSPITAL_COMMUNITY): Payer: Self-pay | Admitting: Emergency Medicine

## 2019-09-13 ENCOUNTER — Emergency Department (HOSPITAL_COMMUNITY)
Admission: EM | Admit: 2019-09-13 | Discharge: 2019-09-14 | Disposition: A | Discharge status: Home / Self Care | Payer: Medicare Other | Attending: Emergency Medicine | Admitting: Emergency Medicine

## 2019-09-13 DIAGNOSIS — R42 Dizziness and giddiness: Secondary | ICD-10-CM | POA: Insufficient documentation

## 2019-09-13 DIAGNOSIS — I10 Essential (primary) hypertension: Secondary | ICD-10-CM | POA: Diagnosis not present

## 2019-09-13 DIAGNOSIS — F1410 Cocaine abuse, uncomplicated: Secondary | ICD-10-CM | POA: Insufficient documentation

## 2019-09-13 DIAGNOSIS — R519 Headache, unspecified: Secondary | ICD-10-CM | POA: Diagnosis not present

## 2019-09-13 DIAGNOSIS — Z79899 Other long term (current) drug therapy: Secondary | ICD-10-CM | POA: Insufficient documentation

## 2019-09-13 DIAGNOSIS — E876 Hypokalemia: Secondary | ICD-10-CM | POA: Insufficient documentation

## 2019-09-13 DIAGNOSIS — F17210 Nicotine dependence, cigarettes, uncomplicated: Secondary | ICD-10-CM | POA: Insufficient documentation

## 2019-09-13 DIAGNOSIS — R110 Nausea: Secondary | ICD-10-CM | POA: Insufficient documentation

## 2019-09-13 DIAGNOSIS — J449 Chronic obstructive pulmonary disease, unspecified: Secondary | ICD-10-CM | POA: Diagnosis not present

## 2019-09-13 LAB — URINALYSIS, ROUTINE W REFLEX MICROSCOPIC
Bilirubin Urine: NEGATIVE
Glucose, UA: NEGATIVE mg/dL
Hgb urine dipstick: NEGATIVE
Ketones, ur: NEGATIVE mg/dL
Leukocytes,Ua: NEGATIVE
Nitrite: NEGATIVE
Protein, ur: NEGATIVE mg/dL
Specific Gravity, Urine: 1.005 (ref 1.005–1.030)
pH: 6 (ref 5.0–8.0)

## 2019-09-13 LAB — COMPREHENSIVE METABOLIC PANEL
ALT: 13 U/L (ref 0–44)
AST: 18 U/L (ref 15–41)
Albumin: 3.9 g/dL (ref 3.5–5.0)
Alkaline Phosphatase: 54 U/L (ref 38–126)
Anion gap: 12 (ref 5–15)
BUN: 11 mg/dL (ref 8–23)
CO2: 24 mmol/L (ref 22–32)
Calcium: 9 mg/dL (ref 8.9–10.3)
Chloride: 104 mmol/L (ref 98–111)
Creatinine, Ser: 0.64 mg/dL (ref 0.44–1.00)
GFR calc Af Amer: >60 mL/min (ref >60)
GFR calc non Af Amer: >60 mL/min (ref >60)
Glucose, Bld: 87 mg/dL (ref 70–99)
Potassium: 2.9 mmol/L — ABNORMAL LOW (ref 3.5–5.1)
Sodium: 140 mmol/L (ref 135–145)
Total Bilirubin: 0.7 mg/dL (ref 0.3–1.2)
Total Protein: 6.4 g/dL — ABNORMAL LOW (ref 6.5–8.1)

## 2019-09-13 LAB — CBC WITH DIFFERENTIAL/PLATELET
Abs Immature Granulocytes: 0.02 10*3/uL (ref 0.00–0.07)
Basophils Absolute: 0.1 10*3/uL (ref 0.0–0.1)
Basophils Relative: 1 %
Eosinophils Absolute: 0.2 10*3/uL (ref 0.0–0.5)
Eosinophils Relative: 3 %
HCT: 38.3 % (ref 36.0–46.0)
Hemoglobin: 13.6 g/dL (ref 12.0–15.0)
Immature Granulocytes: 0 %
Lymphocytes Relative: 49 %
Lymphs Abs: 3.6 10*3/uL (ref 0.7–4.0)
MCH: 32.8 pg (ref 26.0–34.0)
MCHC: 35.5 g/dL (ref 30.0–36.0)
MCV: 92.3 fL (ref 80.0–100.0)
Monocytes Absolute: 0.6 10*3/uL (ref 0.1–1.0)
Monocytes Relative: 9 %
Neutro Abs: 2.8 10*3/uL (ref 1.7–7.7)
Neutrophils Relative %: 38 %
Platelets: 285 10*3/uL (ref 150–400)
RBC: 4.15 MIL/uL (ref 3.87–5.11)
RDW: 14.3 % (ref 11.5–15.5)
WBC: 7.4 10*3/uL (ref 4.0–10.5)
nRBC: 0 % (ref 0.0–0.2)

## 2019-09-13 NOTE — ED Triage Notes (Signed)
Pt brought to the ED by [facility] from home for c/o HA, HTN, dizziness and nausea, last feeling well today at 11 am. Pt is AO x 4, no neuro deficit noticed. BP 190/100, HR 60, R 20, SPO2 98% RA. 4 mg Zofran IV given by EMS pta to Ed.

## 2019-09-14 DIAGNOSIS — I10 Essential (primary) hypertension: Secondary | ICD-10-CM | POA: Diagnosis not present

## 2019-09-14 MED ORDER — POTASSIUM CHLORIDE CRYS ER 20 MEQ PO TBCR
40.0000 meq | EXTENDED_RELEASE_TABLET | Freq: Once | ORAL | Status: AC
  Administered 2019-09-14: 08:00:00 40 meq via ORAL
  Filled 2019-09-14: qty 2

## 2019-09-14 MED ORDER — POTASSIUM CHLORIDE CRYS ER 20 MEQ PO TBCR: 20.0000 meq | EXTENDED_RELEASE_TABLET | Freq: Every day | ORAL | 0 refills | Status: DC

## 2019-09-14 MED ORDER — DIAZEPAM 5 MG PO TABS
5.0000 mg | ORAL_TABLET | Freq: Once | ORAL | Status: AC
  Administered 2019-09-14: 5 mg via ORAL
  Filled 2019-09-14: qty 1

## 2019-09-14 NOTE — ED Provider Notes (Signed)
= [facility] EMERGENCY DEPARTMENT Provider Note   CSN: [id number] Arrival date & time: 09/13/19  2130     History   Chief Complaint Chief Complaint  Patient presents with  . Hypertension    HPI Cassie Harrell is a 68 y.o. female.     The history is provided by the patient and medical records. No language interpreter was used.  Hypertension     68 year old female with history of bipolar disorder, polysubstance use including alcohol use, anxiety, constipation, brought here via EMS from home for evaluation of elevated blood pressure.  Patient states that she is currently taking lisinopril for her blood pressure but it has not been managing her blood pressure well.  She was seen at her doctor's office last week for throat irritation and was noted to have blood pressure in the 170s systolic.  She mention yesterday she developed gradual onset of right-sided headache which she felt could be related to her blood pressure.  She checked her blood pressure and it was around 170.  She admits to drinking several shots of alcohol throughout the day hoping to alleviate her headache unfortunately he did not.  She also recheck her blood pressure several times and when it hits 190 systolic patient decided to come to the ER last night for further evaluation.  Since then she report her headache has since improved after she took her lisinopril prior to coming here.  She did report having some nausea and dizziness with these headaches but that has since subsided.  She denies any recent sick contact.  She denies any fever chills runny nose sneezing or coughing.  Past Medical History:  Diagnosis Date  . Alcohol abuse with alcohol-induced mood disorder ([facility]) 07/31/2016  . Anxiety   . Bipolar 1 disorder ([facility])   . Cocaine abuse ([facility])   . Constipation   . COPD (chronic obstructive pulmonary disease) ([facility])   . Dissociative disorder   . Dysrhythmia    irregular heartrate  . GERD  (gastroesophageal reflux disease)   . Glaucoma   . Hypertension   . Neuropathy of foot    bilateral  . Osteoarthritis    osteoarthritis  . PTSD (post-traumatic stress disorder)   . PVD (peripheral vascular disease) ([facility]) 2017  . Sleep apnea    has had surgery    Patient Active Problem List   Diagnosis Date Noted  . Hypertension with heart disease 04/12/2019  . PSVT (paroxysmal supraventricular tachycardia) ([facility]CC) 04/12/2019  . Hypercholesterolemia 04/12/2019  . Smoking 04/12/2019  . HNP (herniated nucleus pulposus), lumbar 06/20/2017  . Alcohol abuse with alcohol-induced mood disorder (HCC) 07/31/2016  . Bipolar disorder with depression (HCC) 06/28/2016  . Chest pain 06/09/2014  . CAP (community acquired pneumonia) 06/09/2014  . COPD (chronic obstructive pulmonary disease) (HCC)   . Fibromyalgia   . Bipolar 1 disorder ([facility])   . GERD (gastroesophageal reflux disease)     Past Surgical History:  Procedure Laterality Date  . ABDOMINAL HYSTERECTOMY    . APPENDECTOMY    . CESAREAN SECTION    . COLONOSCOPY    . EYE SURGERY    . FINGER SURGERY    . LUMBAR LAMINECTOMY/DECOMPRESSION MICRODISCECTOMY Left 06/20/2017   Procedure: MICRODISCECTOMY LUMBAR 4- LUMBAR 5 LEFT;  Surgeon: Coletta Memos, MD;  Location: [facility];  Service: Neurosurgery;  Laterality: Left;  MICRODISCECTOMY LUMBAR 4- LUMBAR 5 LEFT  . REVISION OF SCAR TISSUE RECTUS MUSCLE    . TONSILLECTOMY    . UVULOPALATOPHARYNGOPLASTY  OB History    Gravida  1   Para      Term      Preterm      AB      Living        SAB      TAB      Ectopic      Multiple      Live Births               Home Medications    Prior to Admission medications   Medication Sig Start Date End Date Taking? Authorizing Provider  albuterol (PROVENTIL HFA;VENTOLIN HFA) 108 (90 BASE) MCG/ACT inhaler Inhale 2 puffs into the lungs every 6 (six) hours as needed for wheezing or shortness of breath.    Yes [provider]   brimonidine-timolol (COMBIGAN) 0.2-0.5 % ophthalmic solution Place 1 drop into both eyes every 12 (twelve) hours.    Yes [provider]  latanoprost (XALATAN) 0.005 % ophthalmic solution Place 1 drop into both eyes at bedtime.    Yes [provider]  linaclotide (LINZESS) 145 MCG CAPS capsule Take 145 mcg by mouth daily as needed (ibs/ constipation).    Yes [provider]  lisinopril-hydrochlorothiazide (PRINZIDE,ZESTORETIC) 20-12.5 MG per tablet Take 1 tablet by mouth daily.    Yes [provider]  Lurasidone HCl (LATUDA) 60 MG TABS Take 60 mg by mouth at bedtime.   Yes [provider]  metoprolol succinate (TOPROL-XL) 25 MG 24 hr tablet Take 1 tablet (25 mg total) by mouth daily. 04/15/19  Yes Earl ManyVyas, Chandra K, MD  Omega-3 Fatty Acids (FISH OIL) 1200 MG CAPS Take 1,200 mg by mouth daily.    Yes [provider]  pantoprazole (PROTONIX) 40 MG tablet Take 40 mg by mouth 2 (two) times daily. 09/03/19  Yes [provider]  traZODone (DESYREL) 100 MG tablet Take 200 mg by mouth at bedtime. 09/05/19  Yes [provider]  umeclidinium bromide (INCRUSE ELLIPTA) 62.5 MCG/INH AEPB Inhale 1 puff into the lungs daily.   Yes [provider]  venlafaxine XR (EFFEXOR-XR) 75 MG 24 hr capsule Take 75 mg by mouth daily. 07/15/19  Yes [provider]  Vitamin D, Ergocalciferol, (DRISDOL) 50000 units CAPS capsule Take 50,000 Units by mouth every Saturday.  06/16/16  Yes [provider]  ibuprofen (ADVIL,MOTRIN) 800 MG tablet Take 1 tablet (800 mg total) by mouth 3 (three) times daily. Patient not taking: Reported on 09/14/2019 08/20/16   Dione BoozeGlick, David, MD    Family History Family History  Problem Relation Age of Onset  . Other Mother        natural  . Heart attack Father   . Other Sister        kidney transplant  . Alcoholism Brother   . Goiter Sister   . Diabetes Brother   . Diabetes Sister     Social History  Social History   Tobacco Use  . Smoking status: Current Every Day Smoker    Packs/day: 0.25    Years: 20.00    Pack years: 5.00    Types: Cigarettes  . Smokeless tobacco: Never Used  Substance Use Topics  . Alcohol use: Not Currently  . Drug use: Yes    Types: Cocaine    Comment: none since 2015/16     Allergies   Seroquel [quetiapine], Codeine, Diclofenac, Flagyl [metronidazole], Imipramine, Meloxicam, Metoprolol tartrate, Olanzapine, Other, and Vistaril [hydroxyzine hcl]   Review of Systems Review of Systems  All other systems reviewed and are negative.    Physical Exam Updated Vital Signs BP (!) 155/71   Pulse 60   Temp 98.1 F (36.7 C) (Oral)   Resp 14   Ht 5\' 4"  (1.626 m)   Wt 70.8 kg   SpO2 97%   BMI 26.78 kg/m   Physical Exam Vitals signs and nursing note reviewed.  Constitutional:      General: She is not in acute distress.    Appearance: She is well-developed.  HENT:     Head: Atraumatic.  Eyes:     Conjunctiva/sclera: Conjunctivae normal.  Neck:     Musculoskeletal: Neck supple.  Cardiovascular:     Rate and Rhythm: Normal rate and regular rhythm.     Pulses: Normal pulses.     Heart sounds: Normal heart sounds.  Pulmonary:     Effort: Pulmonary effort is normal.     Breath sounds: Normal breath sounds.  Abdominal:     Palpations: Abdomen is soft.     Tenderness: There is no abdominal tenderness.  Skin:    Findings: No rash.  Neurological:     Mental Status: She is alert and oriented to person, place, and time.  Psychiatric:        Mood and Affect: Mood normal.      ED Treatments / Results  Labs (all labs ordered are listed, but only abnormal results are displayed) Labs Reviewed  COMPREHENSIVE METABOLIC PANEL - Abnormal; Notable for the following components:      Result Value   Potassium 2.9 (*)    Total Protein 6.4 (*)    All other components within normal limits  URINALYSIS, ROUTINE W REFLEX MICROSCOPIC - Abnormal; Notable  for the following components:   Color, Urine COLORLESS (*)    All other components within normal limits  CBC WITH DIFFERENTIAL/PLATELET    EKG None  ED ECG REPORT   Date: 09/14/2019  Rate: 61  Rhythm: normal sinus rhythm  QRS Axis: left  Intervals: QT prolonged  ST/T Wave abnormalities: normal  Conduction Disutrbances:left bundle branch block and nonspecific intraventricular conduction delay  Narrative Interpretation:   Old EKG Reviewed: unchanged  I have personally reviewed the EKG tracing and agree with the computerized printout as noted.   Radiology No results found.  Procedures Procedures (including critical care time)  Medications Ordered in ED Medications - No data to display   Initial Impression / Assessment and Plan / ED Course  I have reviewed the triage vital signs and the nursing notes.  Pertinent labs & imaging results that were available during my care of the patient were reviewed by me and considered in my medical decision making (see chart for details).        BP (!) 155/71   Pulse 60   Temp 98.1 F (36.7 C) (Oral)   Resp 14   Ht 5\' 4"  (1.626 m)   Wt 70.8 kg   SpO2 97%   BMI 26.78 kg/m    Final Clinical Impressions(s) / ED Diagnoses   Final diagnoses:  Hypertension, unspecified type  Hypokalemia    ED Discharge Orders         Ordered    potassium chloride SA (KLOR-CON) 20 MEQ tablet  Daily     09/14/19 0834         Patient report right-sided headache related to elevated blood pressure as high as 190 systolic.  States that her blood pressure has been running high around 170 systolic the  past several weeks.  She has been compliant with her blood pressure medication.  On exam, patient is well-appearing, no focal neuro deficit on exam.  Labs remarkable for mildly hypokalemic with a potassium of 2.9.  No EKG changes.  Will provide orientation.  Current blood pressure is 155/71.  No evidence concerning for hypertensive emergency.  She felt  better and stable for discharge.  Encourage patient to follow-up with PCP for blood pressure recheck and perhaps better management of her hypertension with medication changes. Care discussed with Dr. Vanita Panda.    Domenic Moras, PA-C 09/14/19 5638    Carmin Muskrat, MD 09/14/19 [phone]

## 2019-09-14 NOTE — ED Notes (Signed)
Pt states having a new onset of chest discomfort and pain on the left arm. Pt states that she also has pain on right side of head.

## 2019-09-14 NOTE — ED Notes (Signed)
Pt states she needs meds for anxiety.  PA notified.

## 2019-09-14 NOTE — Discharge Instructions (Signed)
Please call and follow up closely with your doctor for further management of your high blood pressure.  Your potassium level is low today.  Take supplementation and have your labs recheck at your doctor's office.

## 2019-10-01 ENCOUNTER — Ambulatory Visit (INDEPENDENT_AMBULATORY_CARE_PROVIDER_SITE_OTHER): Payer: Medicare Other | Admitting: Orthopaedic Surgery

## 2019-10-01 ENCOUNTER — Ambulatory Visit (INDEPENDENT_AMBULATORY_CARE_PROVIDER_SITE_OTHER): Payer: Medicare Other

## 2019-10-01 ENCOUNTER — Other Ambulatory Visit: Payer: Self-pay

## 2019-10-01 ENCOUNTER — Encounter: Payer: Self-pay | Admitting: Orthopaedic Surgery

## 2019-10-01 DIAGNOSIS — M25511 Pain in right shoulder: Secondary | ICD-10-CM | POA: Diagnosis not present

## 2019-10-01 DIAGNOSIS — G8929 Other chronic pain: Secondary | ICD-10-CM

## 2019-10-01 DIAGNOSIS — M7541 Impingement syndrome of right shoulder: Secondary | ICD-10-CM

## 2019-10-01 MED ORDER — METHYLPREDNISOLONE ACETATE 40 MG/ML IJ SUSP
40.0000 mg | INTRAMUSCULAR | Status: AC | PRN
  Administered 2019-10-01: 40 mg via INTRA_ARTICULAR

## 2019-10-01 MED ORDER — TRAMADOL HCL 50 MG PO TABS: 50.0000 mg | ORAL_TABLET | Freq: Four times a day (QID) | ORAL | 0 refills | Status: DC | PRN

## 2019-10-01 MED ORDER — LIDOCAINE HCL 1 % IJ SOLN
3.0000 mL | INTRAMUSCULAR | Status: AC | PRN
  Administered 2019-10-01: 15:00:00 3 mL

## 2019-10-01 NOTE — Progress Notes (Signed)
Office Visit Note   Patient: Cassie Harrell           Date of Birth: [birth year]           MRN: [id number] Visit Date: 10/01/2019              Requested by: Fleet Contras, MD [address] [place],  [place] [address] PCP: Fleet Contras, MD   Assessment & Plan: Visit Diagnoses:  1. Chronic right shoulder pain   2. Impingement syndrome of right shoulder     Plan: She does have classic signs and symptoms of impingement syndrome of the right shoulder.  I showed her shoulder model and went over x-rays and described in detail what this means.  I do feel that she would benefit from a shoulder injection with a steroid in the subacromial outlet she agrees with this.  I described the rationale behind this as well as the risk and benefits of steroid injections.  She tolerated it well.  All question concerns were answered and addressed.  We will see her back in about 4 weeks to see how she is doing overall.  I will send in some tramadol for pain.  Follow-Up Instructions: Return in about 4 weeks (around 10/29/2019).   Orders:  Orders Placed This Encounter  Procedures  . Large Joint Inj  . Large Joint Inj  . XR Shoulder Right   No orders of the defined types were placed in this encounter.     Procedures: Large Joint Inj: R subacromial bursa on 10/01/2019 3:10 PM Indications: pain and diagnostic evaluation Details: 22 G 1.5 in needle  Arthrogram: No  Medications: 3 mL lidocaine 1 %; 40 mg methylPREDNISolone acetate 40 MG/ML Outcome: tolerated well, no immediate complications Procedure, treatment alternatives, risks and benefits explained, specific risks discussed. Consent was given by the patient. Immediately prior to procedure a time out was called to verify the correct patient, procedure, equipment, support staff and site/side marked as required. Patient was prepped and draped in the usual sterile fashion.       Clinical Data: No additional  findings.   Subjective: Chief Complaint  Patient presents with  . Right Shoulder - Pain  Patient is a very pleasant 68 year old female sent from Dr. Marcy Salvo to evaluate and treat right shoulder pain.  She has been having 4 months worth of pain with decreased motion and strength with hurting behind her and overhead when she lifts.  Is a constant ache now.  She denies any injury to the shoulder.  She is not a diabetic.  It hurts at night as well and it does wake her up.  She takes ibuprofen for pain.  She denies any numbness and tingling in her right upper extremity and denies any neck pain.  HPI  Review of Systems She currently denies any headache, chest pain, shortness of breath, fever, chills, nausea, vomiting  Objective: Vital Signs: There were no vitals taken for this visit.  Physical Exam She is alert and orient x3 and in no acute distress Ortho Exam Examination of her right shoulder does show significant pain with internal rotation and adduction.  She also has pain past 90 degrees of abduction.  Her range of motion is almost entirely full except for when she reaches behind her with her painful right shoulder she can reach only to the mid lumbar spine where she can reach to the thoracic spine on her left side.  She has a negative liftoff sign.  She does have  positive Neer and Hawkins signs.  I do feel her rotator cuff is intact on exam. Specialty Comments:  No specialty comments available.  Imaging: XR Shoulder Right  Result Date: 10/01/2019 3 views of the right shoulder show well located shoulder.  There is a decrease in the subacromial outlet and AC joint arthritic changes.    PMFS History: Patient Active Problem List   Diagnosis Date Noted  . Hypertension with heart disease 04/12/2019  . PSVT (paroxysmal supraventricular tachycardia) ([place]) 04/12/2019  . Hypercholesterolemia 04/12/2019  . Smoking 04/12/2019  . HNP (herniated nucleus pulposus), lumbar 06/20/2017  . Alcohol  abuse with alcohol-induced mood disorder ([place]) 07/31/2016  . Bipolar disorder with depression ([place]) 06/28/2016  . Chest pain 06/09/2014  . CAP (community acquired pneumonia) 06/09/2014  . COPD (chronic obstructive pulmonary disease) ([place])   . Fibromyalgia   . Bipolar 1 disorder ([place])   . GERD (gastroesophageal reflux disease)    Past Medical History:  Diagnosis Date  . Alcohol abuse with alcohol-induced mood disorder ([place]) 07/31/2016  . Anxiety   . Bipolar 1 disorder ([place])   . Cocaine abuse ([place])   . Constipation   . COPD (chronic obstructive pulmonary disease) ([place])   . Dissociative disorder   . Dysrhythmia    irregular heartrate  . GERD (gastroesophageal reflux disease)   . Glaucoma   . Hypertension   . Neuropathy of foot    bilateral  . Osteoarthritis    osteoarthritis  . PTSD (post-traumatic stress disorder)   . PVD (peripheral vascular disease) ([place]) 2017  . Sleep apnea    has had surgery    Family History  Problem Relation Age of Onset  . Other Mother        natural  . Heart attack Father   . Other Sister        kidney transplant  . Alcoholism Brother   . Goiter Sister   . Diabetes Brother   . Diabetes Sister     Past Surgical History:  Procedure Laterality Date  . ABDOMINAL HYSTERECTOMY    . APPENDECTOMY    . CESAREAN SECTION    . COLONOSCOPY    . EYE SURGERY    . FINGER SURGERY    . LUMBAR LAMINECTOMY/DECOMPRESSION MICRODISCECTOMY Left 06/20/2017   Procedure: MICRODISCECTOMY LUMBAR 4- LUMBAR 5 LEFT;  Surgeon: Ashok Pall, MD;  Location: [facility];  Service: Neurosurgery;  Laterality: Left;  MICRODISCECTOMY LUMBAR 4- LUMBAR 5 LEFT  . REVISION OF SCAR TISSUE RECTUS MUSCLE    . TONSILLECTOMY    . UVULOPALATOPHARYNGOPLASTY     Social History   Occupational History    Employer: OTHER    Comment: disabled  Tobacco Use  . Smoking status: Current Every Day Smoker    Packs/day: 0.25    Years: 20.00    Pack years: 5.00    Types: Cigarettes  . Smokeless  tobacco: Never Used  Substance and Sexual Activity  . Alcohol use: Not Currently  . Drug use: Yes    Types: Cocaine    Comment: none since 2015/16  . Sexual activity: Never                                                                                                                                         ------------------------------------------------------------------------------------------------------------------------------------------------------------------------------------------------------------------------------------------------------------------------------------------------------------------------------------------------------------+++

## 2019-10-04 ENCOUNTER — Other Ambulatory Visit: Payer: Self-pay | Admitting: Cardiology

## 2019-10-14 ENCOUNTER — Ambulatory Visit: Payer: Medicare Other | Admitting: Cardiology

## 2019-10-20 ENCOUNTER — Other Ambulatory Visit: Payer: Self-pay | Admitting: Orthopaedic Surgery

## 2019-10-20 ENCOUNTER — Telehealth: Payer: Self-pay | Admitting: Orthopaedic Surgery

## 2019-10-20 MED ORDER — TRAMADOL HCL 50 MG PO TABS: 50.0000 mg | ORAL_TABLET | Freq: Four times a day (QID) | ORAL | 0 refills | Status: DC | PRN

## 2019-10-20 NOTE — Telephone Encounter (Signed)
Please advise 

## 2019-10-20 NOTE — Telephone Encounter (Signed)
I sent some in to her pharmacy. 

## 2019-10-20 NOTE — Telephone Encounter (Signed)
Patient called. She needs a refill of her tramadol.   Call back number: [phone]

## 2019-11-05 ENCOUNTER — Ambulatory Visit: Payer: Medicare Other | Admitting: Orthopaedic Surgery

## 2019-11-06 ENCOUNTER — Telehealth: Payer: Self-pay | Admitting: Orthopaedic Surgery

## 2019-11-06 MED ORDER — ACETAMINOPHEN-CODEINE #3 300-30 MG PO TABS: 1.0000 | ORAL_TABLET | Freq: Three times a day (TID) | ORAL | 0 refills | Status: DC | PRN

## 2019-11-06 NOTE — Telephone Encounter (Signed)
Please advise 

## 2019-11-06 NOTE — Telephone Encounter (Signed)
Pt called in said her pain medication Tramadol isn't currently helping her pain and she's requesting something different to help her right shoulder. Pharmacy is [facility] on [address].   [phone]

## 2019-11-06 NOTE — Telephone Encounter (Signed)
I will send in some Tylenol 3.  She should use this sparingly.  We usually do not treat shoulder tendinitis and impingement with narcotic.  If she is hurting enough we can certainly discuss surgery at her follow-up appointment with [place].

## 2019-11-06 NOTE — Telephone Encounter (Signed)
LMOM for patient of the below message  

## 2019-11-12 ENCOUNTER — Ambulatory Visit: Payer: Medicare Other | Admitting: Orthopaedic Surgery

## 2019-11-24 ENCOUNTER — Ambulatory Visit: Payer: Medicare Other | Admitting: Orthopaedic Surgery

## 2019-11-27 ENCOUNTER — Encounter: Payer: Self-pay | Admitting: Cardiology

## 2019-11-27 ENCOUNTER — Other Ambulatory Visit: Payer: Self-pay

## 2019-11-27 ENCOUNTER — Ambulatory Visit (INDEPENDENT_AMBULATORY_CARE_PROVIDER_SITE_OTHER): Payer: Medicare Other | Admitting: Cardiology

## 2019-11-27 VITALS — BP 121/76 | HR 66 | Temp 97.3°F | Ht 63.5 in | Wt 157.1 lb

## 2019-11-27 DIAGNOSIS — R002 Palpitations: Secondary | ICD-10-CM

## 2019-11-27 DIAGNOSIS — E7800 Pure hypercholesterolemia, unspecified: Secondary | ICD-10-CM

## 2019-11-27 DIAGNOSIS — I119 Hypertensive heart disease without heart failure: Secondary | ICD-10-CM

## 2019-11-27 DIAGNOSIS — F17210 Nicotine dependence, cigarettes, uncomplicated: Secondary | ICD-10-CM

## 2019-11-27 DIAGNOSIS — F17200 Nicotine dependence, unspecified, uncomplicated: Secondary | ICD-10-CM

## 2019-11-27 MED ORDER — METOPROLOL SUCCINATE ER 25 MG PO TB24: 25.0000 mg | ORAL_TABLET | Freq: Every day | ORAL | 3 refills | Status: DC

## 2019-11-27 NOTE — Progress Notes (Signed)
Primary Physician/Referring:  Nolene Ebbs, MD  Patient ID: Cassie Harrell, female    DOB: 1951/10/11, 69 y.o.   MRN: [id number]  Chief Complaint  Patient presents with  . Hypertension  . PSVT  . Follow-up    60mo   HPI:    Cassie Harrell  is a 69 y.o. with HTN, COPD, GERD, arthritis, sleep apnea, bipolar disorder, mild hyperlipidemia, brief atrial tachycardia on event monitor in 2017, tobacco use disorder, remote coccaine use and abstinent over past 15 years presents for 6 month follow up of palpitations and hypertension. Since last seen, presented to ER on 09/14/2019 with uncontrolled hypertension. She is now on Lisinopril HCT per PCP. Remains asymptomatic.   Past Medical History:  Diagnosis Date  . Alcohol abuse with alcohol-induced mood disorder ([place]) 07/31/2016  . Anxiety   . Bipolar 1 disorder ([place])   . Cocaine abuse ([place])   . Constipation   . COPD (chronic obstructive pulmonary disease) ([place])   . Dissociative disorder   . Dysrhythmia    irregular heartrate  . GERD (gastroesophageal reflux disease)   . Glaucoma   . Hypertension   . Neuropathy of foot    bilateral  . Osteoarthritis    osteoarthritis  . PTSD (post-traumatic stress disorder)   . PVD (peripheral vascular disease) ([place]) 2017  . Sleep apnea    has had surgery   Past Surgical History:  Procedure Laterality Date  . ABDOMINAL HYSTERECTOMY    . APPENDECTOMY    . CESAREAN SECTION    . COLONOSCOPY    . EYE SURGERY    . FINGER SURGERY    . LUMBAR LAMINECTOMY/DECOMPRESSION MICRODISCECTOMY Left 06/20/2017   Procedure: MICRODISCECTOMY LUMBAR 4- LUMBAR 5 LEFT;  Surgeon: Ashok Pall, MD;  Location: [facility];  Service: Neurosurgery;  Laterality: Left;  MICRODISCECTOMY LUMBAR 4- LUMBAR 5 LEFT  . REVISION OF SCAR TISSUE RECTUS MUSCLE    . TONSILLECTOMY    . UVULOPALATOPHARYNGOPLASTY     Social History   Tobacco Use  . Smoking status: Current Every Day Smoker    Packs/day: 0.25    Years: 20.00   Pack years: 5.00    Types: Cigarettes  . Smokeless tobacco: Never Used  Substance Use Topics  . Alcohol use: Not Currently    ROS  Review of Systems  Cardiovascular: Negative for dyspnea on exertion and leg swelling.  Gastrointestinal: Negative for melena.   Objective  Blood pressure 121/76, pulse 66, temperature (!) 97.3 F (36.3 C), height 5' 3.5" (1.613 m), weight 157 lb 1.6 oz (71.3 kg), SpO2 97 %.  Vitals with BMI 11/27/2019 09/14/2019 09/14/2019  Height 5' 3.5" - -  Weight 157 lbs 2 oz - -  BMI 90.24 - -  Systolic 097 353 299  Diastolic 76 71 74  Pulse 66 - -  Some encounter information is confidential and restricted. Go to Review Flowsheets activity to see all data.     Physical Exam  Neck: No thyromegaly present.  Cardiovascular: Normal rate, regular rhythm, normal heart sounds and intact distal pulses. Exam reveals no gallop.  No murmur heard. No leg edema, no JVD.  Pulmonary/Chest: Effort normal and breath sounds normal.  Abdominal: Soft. Bowel sounds are normal.  Musculoskeletal:     Cervical back: Neck supple.  Skin: Skin is warm and dry.   Laboratory examination:   Recent Labs    09/13/19 2126  NA 140  K 2.9*  CL 104  CO2 24  GLUCOSE 87  BUN 11  CREATININE 0.64  CALCIUM 9.0  GFRNONAA >60  GFRAA >60   CrCl cannot be calculated (Patient's most recent lab result is older than the maximum 21 days allowed.).  CMP Latest Ref Rng & Units 09/13/2019 10/26/2017 06/20/2017  Glucose 70 - 99 mg/dL 87 696(E) 99  BUN 8 - 23 mg/dL 11 20 9   Creatinine 0.44 - 1.00 mg/dL 9.52) 8.41(L  Sodium 135 - 145 mmol/L 140 136 140  Potassium 3.5 - 5.1 mmol/L 2.9(L) 3.2(L) 3.8  Chloride 98 - 111 mmol/L 104 98(L) 105  CO2 22 - 32 mmol/L 24 - 25  Calcium 8.9 - 10.3 mg/dL 9.0 - 9.0  Total Protein 6.5 - 8.1 g/dL 6.4(L) - 6.8  Total Bilirubin 0.3 - 1.2 mg/dL 0.7 - 0.7  Alkaline Phos 38 - 126 U/L 54 - 57  AST 15 - 41 U/L 18 - 20  ALT 0 - 44 U/L 13 - 14   CBC Latest Ref  Rng & Units 09/13/2019 10/26/2017 10/26/2017  WBC 4.0 - 10.5 K/uL 7.4 - 8.4  Hemoglobin 12.0 - 15.0 g/dL 12/24/2017 01.0 27.2  Hematocrit 36.0 - 46.0 % 38.3 41.0 41.3  Platelets 150 - 400 K/uL 285 - 293   Lipid Panel  No results found for: CHOL, TRIG, HDL, CHOLHDL, VLDL, LDLCALC, LDLDIRECT HEMOGLOBIN A1C Lab Results  Component Value Date   HGBA1C 5.2 01/07/2014   TSH No results for input(s): TSH in the last 8760 hours.  External labs : none  Medications and allergies   Allergies  Allergen Reactions  . Seroquel [Quetiapine] Other (See Comments)    HALLUCINATIONS  . Codeine Hives and Nausea And Vomiting  . Diclofenac Palpitations and Other (See Comments)    Other pains  . Flagyl [Metronidazole] Other (See Comments)    Crazy   . Imipramine Rash  . Meloxicam Itching  . Metoprolol Tartrate Itching and Rash  . Olanzapine Other (See Comments)    Patient preference   . Other Rash    Oil of olay moisturizing cream and eye cream caused measles like rash  . Vistaril [Hydroxyzine Hcl] Itching and Rash     Current Outpatient Medications  Medication Instructions  . acetaminophen-codeine (TYLENOL #3) 300-30 MG tablet 1-2 tablets, Oral, Every 8 hours PRN  . albuterol (PROVENTIL HFA;VENTOLIN HFA) 108 (90 BASE) MCG/ACT inhaler 2 puffs, Inhalation, Every 6 hours PRN  . amLODipine (NORVASC) 5 mg, Oral, Daily  . brimonidine-timolol (COMBIGAN) 0.2-0.5 % ophthalmic solution 1 drop, Both Eyes, Every 12 hours  . Fish Oil 1,200 mg, Oral, Daily  . ibuprofen (ADVIL) 800 mg, Oral, 3 times daily  . latanoprost (XALATAN) 0.005 % ophthalmic solution 1 drop, Both Eyes, Daily at bedtime  . linaclotide (LINZESS) 145 mcg, Oral, Daily PRN  . lisinopril-hydrochlorothiazide (ZESTORETIC) 20-12.5 MG tablet 1 tablet, Oral, Daily  . Lurasidone HCl (LATUDA) 60 mg, Oral, Daily at bedtime  . metoprolol succinate (TOPROL-XL) 25 mg, Oral, Daily  . mupirocin ointment (BACTROBAN) 2 % APPLY EXTERNALLY TO THE SKIN TWICE  DAILY AS DIRECTED  . pantoprazole (PROTONIX) 40 mg, Oral, 2 times daily  . potassium chloride SA (KLOR-CON) 20 MEQ tablet 20 mEq, Oral, Daily  . traZODone (DESYREL) 200 mg, Oral, Daily at bedtime  . umeclidinium bromide (INCRUSE ELLIPTA) 62.5 MCG/INH AEPB 1 puff, Inhalation, Daily  . venlafaxine XR (EFFEXOR-XR) 75 mg, Oral, Daily  . Vitamin D (Ergocalciferol) (DRISDOL) 50,000 Units, Oral, Every Sat    Radiology:  No results found.  Cardiac Studies:  Echocardiogram 01/20/2016: Left ventricle cavity is normal in size. Moderate concentric hypertrophy of the left ventricle. Normal global wall motion. Doppler evidence of grade I (impaired) diastolic dysfunction. Calculated EF 61%. Trace mitral regurgitation. Mild tricuspid regurgitation. No evidence of pulmonary hypertension. Mild pulmonic regurgitation.  Lexiscan myoview stress test 01/14/2016: 1. The resting electrocardiogram demonstrated normal sinus rhythm LVH and no resting arrhythmias. Stress EKG is non-diagnostic for ischemia as it a pharmacologic stress using Lexiscan. Stress symptoms included dyspnea. 2. Myocardial perfusion imaging is normal. Overall left ventricular systolic function was normal without regional wall motion abnormalities. The left ventricular ejection fraction was 57%.  Holter monitor- 12/31/15-01/13/16- Three short runs of PSVT. Longest run consisted of 16 beats. The fastest run had average heart rate of 133/m. Rare PACs, PVCs.  Assessment     ICD-10-CM   1. PSVT (paroxysmal supraventricular tachycardia) (HCC)  I47.1   2. Hypertension with heart disease  I11.9   3. Hypercholesterolemia  E78.00   4. Smoking  F17.200     EKG 09/13/2019: Normal sinus rhythm at rate of 66 bpm, normal axis, IVCD, borderline criteria for LVH.  No evidence of ischemia.    No orders of the defined types were placed in this encounter.   Medications Discontinued During This Encounter  Medication Reason  . traMADol (ULTRAM) 50 MG  tablet Completed Course     Recommendations:   Cassie Harrell  is a 69 y.o. A[facility] patient with HTN, COPD, GERD, arthritis, sleep apnea, bipolar disorder, mild hyperlipidemia, brief atrial tachycardia on event monitor in 2017, tobacco use disorder, remote coccaine use and abstinent over past 15 years presents for 6 month follow up of palpitations and hypertension.  Patient asymptomatic, physical examination is unremarkable, prior EKG from the emergency room also evaluated, sinus rhythm without significant abnormality.  She has not had any further palpitations since being on metoprolol which I refilled and blood pressure is also well controlled on lisinopril HCT and metoprolol.  I do not think she has PSVT, she probably has brief atrial tachycardia which is now resolved on low-dose beta blockers.  Smoking cessation was discussed at length.  Goal quit date a birthday which is in one month, I discussed with the patient to fix this date as quit date.  She appears to be motivated, use of nicotine patches discussed.  As she remained stable from cardiac standpoint, I'll see her back on a p.r.n. basis.  Cassie Decamp, MD, [facility] 11/27/2019, 11:40 AM [facility] [place] Office: [phone]

## 2019-12-12 ENCOUNTER — Ambulatory Visit: Payer: Medicare Other | Attending: Internal Medicine

## 2019-12-12 DIAGNOSIS — Z23 Encounter for immunization: Secondary | ICD-10-CM

## 2019-12-12 NOTE — Progress Notes (Signed)
   Covid-19 Vaccination Clinic  Name:  Cassie Harrell    MRN: [id number] DOB: November 09, 1950  12/12/2019  Ms. Cassie Harrell was observed post Covid-19 immunization for 15 minutes without incidence. She was provided with Vaccine Information Sheet and instruction to access the V-Safe system.   Ms. Cassie Harrell was instructed to call 911 with any severe reactions post vaccine: Marland Kitchen Difficulty breathing  . Swelling of your face and throat  . A fast heartbeat  . A bad rash all over your body  . Dizziness and weakness    Immunizations Administered    Name Date Dose VIS Date Route   Pfizer COVID-19 Vaccine 12/12/2019  2:14 PM 0.3 mL 09/26/2019 Intramuscular   Manufacturer: [employer], [employer]   Lot: [id number]   NDC: [address]-9

## 2020-01-07 ENCOUNTER — Ambulatory Visit: Payer: Medicare Other | Attending: Internal Medicine

## 2020-01-07 DIAGNOSIS — Z23 Encounter for immunization: Secondary | ICD-10-CM

## 2020-01-07 NOTE — Progress Notes (Signed)
   Covid-19 Vaccination Clinic  Name:  Cassie Harrell    MRN: [id number] DOB: 04/12/1951  01/07/2020  Ms. Real was observed post Covid-19 immunization for 15 minutes without incident. She was provided with Vaccine Information Sheet and instruction to access the V-Safe system.   [name] was instructed to call 911 with any severe reactions post vaccine: Marland Kitchen Difficulty breathing  . Swelling of face and throat  . A fast heartbeat  . A bad rash all over body  . Dizziness and weakness   Immunizations Administered    Name Date Dose VIS Date Route   Pfizer COVID-19 Vaccine 01/07/2020 11:05 AM 0.3 mL 09/26/2019 Intramuscular   Manufacturer: [employer], [employer]   Lot: [id number]   NDC: [address]-8

## 2020-01-19 ENCOUNTER — Other Ambulatory Visit: Payer: Self-pay | Admitting: Cardiology

## 2020-01-19 DIAGNOSIS — I119 Hypertensive heart disease without heart failure: Secondary | ICD-10-CM

## 2020-01-19 DIAGNOSIS — R002 Palpitations: Secondary | ICD-10-CM

## 2020-06-02 ENCOUNTER — Other Ambulatory Visit: Payer: Self-pay | Admitting: Internal Medicine

## 2020-06-02 DIAGNOSIS — Z1231 Encounter for screening mammogram for malignant neoplasm of breast: Secondary | ICD-10-CM

## 2020-06-17 ENCOUNTER — Other Ambulatory Visit: Payer: Self-pay | Admitting: Internal Medicine

## 2020-06-17 DIAGNOSIS — E2839 Other primary ovarian failure: Secondary | ICD-10-CM

## 2020-07-06 ENCOUNTER — Ambulatory Visit: Payer: Medicare Other

## 2020-07-12 ENCOUNTER — Other Ambulatory Visit: Payer: Self-pay

## 2020-07-12 ENCOUNTER — Ambulatory Visit
Admission: RE | Admit: 2020-07-12 | Discharge: 2020-07-12 | Disposition: A | Discharge status: Home / Self Care | Payer: Medicare Other | Source: Ambulatory Visit | Attending: Internal Medicine | Admitting: Internal Medicine

## 2020-07-12 DIAGNOSIS — Z1231 Encounter for screening mammogram for malignant neoplasm of breast: Secondary | ICD-10-CM

## 2020-10-05 ENCOUNTER — Other Ambulatory Visit: Payer: Medicare Other

## 2021-01-13 ENCOUNTER — Other Ambulatory Visit: Payer: Medicare Other

## 2021-01-13 ENCOUNTER — Other Ambulatory Visit: Payer: Self-pay | Admitting: Internal Medicine

## 2021-01-13 DIAGNOSIS — E2839 Other primary ovarian failure: Secondary | ICD-10-CM

## 2021-04-06 ENCOUNTER — Other Ambulatory Visit: Payer: Self-pay | Admitting: Cardiology

## 2021-04-06 DIAGNOSIS — R002 Palpitations: Secondary | ICD-10-CM

## 2021-04-06 DIAGNOSIS — I119 Hypertensive heart disease without heart failure: Secondary | ICD-10-CM

## 2021-06-06 ENCOUNTER — Other Ambulatory Visit: Payer: Self-pay | Admitting: Internal Medicine

## 2021-06-06 DIAGNOSIS — Z1231 Encounter for screening mammogram for malignant neoplasm of breast: Secondary | ICD-10-CM

## 2021-06-16 ENCOUNTER — Other Ambulatory Visit: Payer: Self-pay | Admitting: Internal Medicine

## 2021-06-17 LAB — CBC
HCT: 39.7 % (ref 35.0–45.0)
Hemoglobin: 12.9 g/dL (ref 11.7–15.5)
MCH: 32.9 pg (ref 27.0–33.0)
MCHC: 32.5 g/dL (ref 32.0–36.0)
MCV: 101.3 fL — ABNORMAL HIGH (ref 80.0–100.0)
MPV: 10.2 fL (ref 7.5–12.5)
Platelets: 275 10*3/uL (ref 140–400)
RBC: 3.92 10*6/uL (ref 3.80–5.10)
RDW: 13.4 % (ref 11.0–15.0)
WBC: 5.9 10*3/uL (ref 3.8–10.8)

## 2021-06-17 LAB — LIPID PANEL
Cholesterol: 197 mg/dL (ref <200)
HDL: 42 mg/dL — ABNORMAL LOW (ref >=50)
Non-HDL Cholesterol (Calc): 155 mg/dL (calc) — ABNORMAL HIGH (ref <130)
Total CHOL/HDL Ratio: 4.7 (calc) (ref <5.0)
Triglycerides: 439 mg/dL — ABNORMAL HIGH (ref <150)

## 2021-06-17 LAB — COMPLETE METABOLIC PANEL WITH GFR
AG Ratio: 1.8 (calc) (ref 1.0–2.5)
ALT: 8 U/L (ref 6–29)
AST: 17 U/L (ref 10–35)
Albumin: 3.9 g/dL (ref 3.6–5.1)
Alkaline phosphatase (APISO): 51 U/L (ref 37–153)
BUN/Creatinine Ratio: 15 (calc) (ref 6–22)
BUN: 19 mg/dL (ref 7–25)
CO2: 20 mmol/L (ref 20–32)
Calcium: 8.8 mg/dL (ref 8.6–10.4)
Chloride: 108 mmol/L (ref 98–110)
Creat: 1.31 mg/dL — ABNORMAL HIGH (ref 0.50–1.05)
Globulin: 2.2 g/dL (calc) (ref 1.9–3.7)
Glucose, Bld: 105 mg/dL — ABNORMAL HIGH (ref 65–99)
Potassium: 4.2 mmol/L (ref 3.5–5.3)
Sodium: 138 mmol/L (ref 135–146)
Total Bilirubin: 0.5 mg/dL (ref 0.2–1.2)
Total Protein: 6.1 g/dL (ref 6.1–8.1)
eGFR: 44 mL/min/{1.73_m2} — ABNORMAL LOW (ref >=60)

## 2021-06-17 LAB — TSH: TSH: 1.63 mIU/L (ref 0.40–4.50)

## 2021-06-17 LAB — VITAMIN D 25 HYDROXY (VIT D DEFICIENCY, FRACTURES): Vit D, 25-Hydroxy: 50 ng/mL (ref 30–100)

## 2021-06-28 ENCOUNTER — Other Ambulatory Visit: Payer: Self-pay

## 2021-06-28 ENCOUNTER — Ambulatory Visit
Admission: RE | Admit: 2021-06-28 | Discharge: 2021-06-28 | Disposition: A | Discharge status: Home / Self Care | Payer: Medicare Other | Source: Ambulatory Visit | Attending: Internal Medicine | Admitting: Internal Medicine

## 2021-06-28 DIAGNOSIS — E2839 Other primary ovarian failure: Secondary | ICD-10-CM

## 2021-07-20 ENCOUNTER — Other Ambulatory Visit: Payer: Self-pay

## 2021-07-20 ENCOUNTER — Ambulatory Visit
Admission: RE | Admit: 2021-07-20 | Discharge: 2021-07-20 | Disposition: A | Discharge status: Home / Self Care | Payer: Medicare Other | Source: Ambulatory Visit | Attending: Internal Medicine | Admitting: Internal Medicine

## 2021-07-20 DIAGNOSIS — Z1231 Encounter for screening mammogram for malignant neoplasm of breast: Secondary | ICD-10-CM

## 2021-10-26 ENCOUNTER — Other Ambulatory Visit: Payer: Self-pay | Admitting: Internal Medicine

## 2021-10-27 LAB — BASIC METABOLIC PANEL WITH GFR
BUN: 9 mg/dL (ref 7–25)
CO2: 19 mmol/L — ABNORMAL LOW (ref 20–32)
Calcium: 8.1 mg/dL — ABNORMAL LOW (ref 8.6–10.4)
Chloride: 106 mmol/L (ref 98–110)
Creat: 1.02 mg/dL (ref 0.50–1.05)
Glucose, Bld: 102 mg/dL — ABNORMAL HIGH (ref 65–99)
Potassium: 4.5 mmol/L (ref 3.5–5.3)
Sodium: 136 mmol/L (ref 135–146)
eGFR: 60 mL/min/{1.73_m2} (ref >=60)

## 2021-10-27 LAB — EXTRA LAV TOP TUBE

## 2021-10-27 LAB — FOLATE: Folate: 7.3 ng/mL

## 2021-10-27 LAB — VITAMIN B12: Vitamin B-12: 542 pg/mL (ref 200–1100)

## 2021-10-27 LAB — MAGNESIUM: Magnesium: 1.7 mg/dL (ref 1.5–2.5)

## 2022-06-12 ENCOUNTER — Other Ambulatory Visit: Payer: Self-pay | Admitting: Internal Medicine

## 2022-06-12 DIAGNOSIS — Z1231 Encounter for screening mammogram for malignant neoplasm of breast: Secondary | ICD-10-CM

## 2022-07-14 ENCOUNTER — Ambulatory Visit (INDEPENDENT_AMBULATORY_CARE_PROVIDER_SITE_OTHER): Payer: Medicare HMO | Admitting: Behavioral Health

## 2022-07-14 ENCOUNTER — Encounter: Payer: Self-pay | Admitting: Behavioral Health

## 2022-07-14 VITALS — BP 112/71 | HR 81 | Ht 63.0 in | Wt 172.0 lb

## 2022-07-14 DIAGNOSIS — F411 Generalized anxiety disorder: Secondary | ICD-10-CM

## 2022-07-14 DIAGNOSIS — F3160 Bipolar disorder, current episode mixed, unspecified: Secondary | ICD-10-CM

## 2022-07-14 DIAGNOSIS — F319 Bipolar disorder, unspecified: Secondary | ICD-10-CM

## 2022-07-14 DIAGNOSIS — F5105 Insomnia due to other mental disorder: Secondary | ICD-10-CM | POA: Diagnosis not present

## 2022-07-14 DIAGNOSIS — F99 Mental disorder, not otherwise specified: Secondary | ICD-10-CM

## 2022-07-14 MED ORDER — TRAZODONE HCL 50 MG PO TABS: 50.0000 mg | ORAL_TABLET | Freq: Every day | ORAL | 1 refills | Status: DC

## 2022-07-14 MED ORDER — CLONAZEPAM 0.5 MG PO TABS: 0.5000 mg | ORAL_TABLET | Freq: Every day | ORAL | 3 refills | Status: DC | PRN

## 2022-07-14 MED ORDER — LATUDA 40 MG PO TABS: 40.0000 mg | ORAL_TABLET | Freq: Every day | ORAL | 3 refills | Status: DC

## 2022-07-14 MED ORDER — QUETIAPINE FUMARATE 100 MG PO TABS: 100.0000 mg | ORAL_TABLET | Freq: Every day | ORAL | 1 refills | Status: DC
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
## 2022-07-17 ENCOUNTER — Telehealth: Payer: Self-pay | Admitting: Behavioral Health

## 2022-07-17 NOTE — Telephone Encounter (Signed)
Cassie Harrell called and wants to inform a nurse about the info she left out in her history. Could one of you please call her at [phone].
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
## 2022-07-18 NOTE — Telephone Encounter (Signed)
Ok, she did share all that with me during the appointment. Thank you.

## 2022-07-21 ENCOUNTER — Ambulatory Visit
Admission: RE | Admit: 2022-07-21 | Discharge: 2022-07-21 | Disposition: A | Discharge status: Home / Self Care | Payer: Medicare HMO | Source: Ambulatory Visit | Attending: Internal Medicine | Admitting: Internal Medicine

## 2022-07-21 DIAGNOSIS — Z1231 Encounter for screening mammogram for malignant neoplasm of breast: Secondary | ICD-10-CM

## 2022-08-03 ENCOUNTER — Encounter (HOSPITAL_COMMUNITY): Payer: Self-pay | Admitting: Emergency Medicine

## 2022-08-03 ENCOUNTER — Emergency Department (HOSPITAL_COMMUNITY)
Admission: EM | Admit: 2022-08-03 | Discharge: 2022-08-04 | Disposition: A | Discharge status: Home / Self Care | Payer: Medicare HMO | Attending: Emergency Medicine | Admitting: Emergency Medicine

## 2022-08-03 ENCOUNTER — Emergency Department (HOSPITAL_COMMUNITY): Payer: Medicare HMO

## 2022-08-03 DIAGNOSIS — J449 Chronic obstructive pulmonary disease, unspecified: Secondary | ICD-10-CM | POA: Diagnosis not present

## 2022-08-03 DIAGNOSIS — Z79899 Other long term (current) drug therapy: Secondary | ICD-10-CM | POA: Diagnosis not present

## 2022-08-03 DIAGNOSIS — R0602 Shortness of breath: Secondary | ICD-10-CM | POA: Diagnosis not present

## 2022-08-03 DIAGNOSIS — R079 Chest pain, unspecified: Secondary | ICD-10-CM | POA: Diagnosis not present

## 2022-08-03 DIAGNOSIS — I10 Essential (primary) hypertension: Secondary | ICD-10-CM | POA: Insufficient documentation

## 2022-08-03 DIAGNOSIS — Z7951 Long term (current) use of inhaled steroids: Secondary | ICD-10-CM | POA: Insufficient documentation

## 2022-08-03 DIAGNOSIS — R059 Cough, unspecified: Secondary | ICD-10-CM | POA: Insufficient documentation

## 2022-08-03 LAB — BASIC METABOLIC PANEL
Anion gap: 10 (ref 5–15)
BUN: 7 mg/dL — ABNORMAL LOW (ref 8–23)
CO2: 22 mmol/L (ref 22–32)
Calcium: 8.7 mg/dL — ABNORMAL LOW (ref 8.9–10.3)
Chloride: 107 mmol/L (ref 98–111)
Creatinine, Ser: 1.09 mg/dL — ABNORMAL HIGH (ref 0.44–1.00)
GFR, Estimated: 55 mL/min — ABNORMAL LOW (ref >60)
Glucose, Bld: 171 mg/dL — ABNORMAL HIGH (ref 70–99)
Potassium: 4 mmol/L (ref 3.5–5.1)
Sodium: 139 mmol/L (ref 135–145)

## 2022-08-03 LAB — CBC WITH DIFFERENTIAL/PLATELET
Abs Immature Granulocytes: 0.02 10*3/uL (ref 0.00–0.07)
Basophils Absolute: 0.1 10*3/uL (ref 0.0–0.1)
Basophils Relative: 2 %
Eosinophils Absolute: 0.2 10*3/uL (ref 0.0–0.5)
Eosinophils Relative: 3 %
HCT: 38.3 % (ref 36.0–46.0)
Hemoglobin: 12.1 g/dL (ref 12.0–15.0)
Immature Granulocytes: 0 %
Lymphocytes Relative: 40 %
Lymphs Abs: 2.6 10*3/uL (ref 0.7–4.0)
MCH: 31.8 pg (ref 26.0–34.0)
MCHC: 31.6 g/dL (ref 30.0–36.0)
MCV: 100.5 fL — ABNORMAL HIGH (ref 80.0–100.0)
Monocytes Absolute: 0.5 10*3/uL (ref 0.1–1.0)
Monocytes Relative: 7 %
Neutro Abs: 3.1 10*3/uL (ref 1.7–7.7)
Neutrophils Relative %: 48 %
Platelets: 305 10*3/uL (ref 150–400)
RBC: 3.81 MIL/uL — ABNORMAL LOW (ref 3.87–5.11)
RDW: 14.5 % (ref 11.5–15.5)
WBC: 6.5 10*3/uL (ref 4.0–10.5)
nRBC: 0 % (ref 0.0–0.2)

## 2022-08-03 LAB — D-DIMER, QUANTITATIVE: D-Dimer, Quant: 0.67 ug/mL-FEU — ABNORMAL HIGH (ref 0.00–0.50)

## 2022-08-03 LAB — TROPONIN I (HIGH SENSITIVITY)
Troponin I (High Sensitivity): 6 ng/L (ref <18)
Troponin I (High Sensitivity): 7 ng/L (ref <18)

## 2022-08-03 MED ORDER — IBUPROFEN 400 MG PO TABS
600.0000 mg | ORAL_TABLET | Freq: Once | ORAL | Status: AC
  Administered 2022-08-03: 600 mg via ORAL
  Filled 2022-08-03: qty 1

## 2022-08-03 NOTE — ED Triage Notes (Signed)
Pt reports left shoulder blade pain since yesterday with breathing.  Endorses mild SOB. Pain is sharp and constant.

## 2022-08-03 NOTE — ED Provider Triage Note (Signed)
Emergency Medicine Provider Triage Evaluation Note  Cassie Harrell , a 71 y.o. female  was evaluated in triage.  Pt complains of left-sided chest pain that feels close to the scapula.  She also complains of shortness of breath.  Symptoms began yesterday.  Patient states she has a history of a collapsed lung and that this feels similar to that incident.  Patient endorses shortness of breath, sharp pain located near the left scapula.  Denies abdominal pain, nausea, vomiting.  Review of Systems  Positive: As above Negative: As above  Physical Exam  BP 112/67   Pulse 73   Temp 98.1 F (36.7 C) (Oral)   Resp 18   SpO2 100%  Gen:   Awake, no distress   Resp:  Normal effort  MSK:   Moves extremities without difficulty  Other:    Medical Decision Making  Medically screening exam initiated at 1:20 PM.  Appropriate orders placed.  Cassie Harrell was informed that the remainder of the evaluation will be completed by another provider, this initial triage assessment does not replace that evaluation, and the importance of remaining in the ED until their evaluation is complete.     Dorothyann Peng, PA-C 08/03/22 1322

## 2022-08-04 ENCOUNTER — Emergency Department (HOSPITAL_COMMUNITY): Payer: Medicare HMO

## 2022-08-04 DIAGNOSIS — R079 Chest pain, unspecified: Secondary | ICD-10-CM | POA: Diagnosis not present

## 2022-08-04 MED ORDER — ALUM & MAG HYDROXIDE-SIMETH 200-200-20 MG/5ML PO SUSP
30.0000 mL | Freq: Once | ORAL | Status: AC
  Administered 2022-08-04: 30 mL via ORAL
  Filled 2022-08-04: qty 30

## 2022-08-04 MED ORDER — IOHEXOL 350 MG/ML SOLN
80.0000 mL | Freq: Once | INTRAVENOUS | Status: AC | PRN
  Administered 2022-08-04: 80 mL via INTRAVENOUS

## 2022-08-04 MED ORDER — LIDOCAINE VISCOUS HCL 2 % MT SOLN
15.0000 mL | Freq: Once | OROMUCOSAL | Status: AC
  Administered 2022-08-04: 15 mL via ORAL
  Filled 2022-08-04: qty 15

## 2022-08-04 MED ORDER — FAMOTIDINE 20 MG PO TABS
20.0000 mg | ORAL_TABLET | Freq: Once | ORAL | Status: AC
  Administered 2022-08-04: 20 mg via ORAL
  Filled 2022-08-04: qty 1

## 2022-08-04 MED ORDER — FAMOTIDINE 20 MG PO TABS: 20.0000 mg | ORAL_TABLET | Freq: Two times a day (BID) | ORAL | 0 refills | Status: DC

## 2022-08-04 MED ORDER — OXYCODONE-ACETAMINOPHEN 5-325 MG PO TABS
1.0000 | ORAL_TABLET | ORAL | Status: DC | PRN
  Administered 2022-08-04: 1 via ORAL
  Filled 2022-08-04: qty 1

## 2022-08-04 NOTE — ED Notes (Signed)
Pt. Returned from CT scan.

## 2022-08-04 NOTE — ED Provider Notes (Signed)
[facility] EMERGENCY DEPARTMENT Provider Note   CSN: [id number] Arrival date & time: 08/03/22  1222     History  Chief Complaint  Patient presents with      Shortness of Breath    Cassie Harrell is a 71 y.o. female.   Chest Pain   71 year old female presents emergency department chest pain.  Patient reports chest pain that began 2 mornings ago after getting up from her bed..  She described as sharp in nature without radiation.  It is worsened with taking a deep breath as well as after waking up from sleep.  Pain is described as left infrascapular/lateral chest region.  She also noticed increasing cough after lying down all night.  Describes cough as productive in nature.  She reports some shortness of breath when she feels pain as she takes a deep breath.  Pain is not exacerbated with physical activity.  Denies fever, chills, night sweats, abdominal pain, nausea, vomiting, urinary/vaginal symptoms, change in bowel habits.  She reports history of collapsed lung and is fearful that it may have recurred.  Denies history of DVT/PE/recent immobilization or surgery, current hormonal therapy.  She has tried at home Tylenol with some relief of symptoms.  She denies any recent alcohol or cocaine use; last cocaine use was "several years ago."  Past medical history significant for hypertension, OSA, PTSD, COPD, bipolar 1 disorder, PVD  Home Medications Prior to Admission medications   Medication Sig Start Date End Date Taking? Authorizing Provider  famotidine (PEPCID) 20 MG tablet Take 1 tablet (20 mg total) by mouth 2 (two) times daily. 08/04/22  Yes Sherian Maroon A, PA  albuterol (PROVENTIL HFA;VENTOLIN HFA) 108 (90 BASE) MCG/ACT inhaler Inhale 2 puffs into the lungs every 6 (six) hours as needed for wheezing or shortness of breath.     [provider]  amLODipine (NORVASC) 5 MG tablet Take 5 mg by mouth daily. 09/15/19   [provider]   brimonidine-timolol (COMBIGAN) 0.2-0.5 % ophthalmic solution Place 1 drop into both eyes every 12 (twelve) hours.     [provider]  clonazePAM (KLONOPIN) 0.5 MG tablet Take 1-2 tablets (0.5-1 mg total) by mouth daily as needed. 07/14/22   Joan Flores, NP  estradiol (ESTRACE) 0.1 MG/GM vaginal cream Place vaginally. 05/09/22   [provider]  furosemide (LASIX) 20 MG tablet Take 20 mg by mouth 2 (two) times daily. 07/12/22   [provider]  gabapentin (NEURONTIN) 100 MG capsule Take 100 mg by mouth at bedtime. 06/24/22   [provider]  ibandronate (BONIVA) 150 MG tablet Take 150 mg by mouth every 30 (thirty) days. 07/05/22   [provider]  ibuprofen (ADVIL,MOTRIN) 800 MG tablet Take 1 tablet (800 mg total) by mouth 3 (three) times daily. Patient taking differently: Take 800 mg by mouth as needed. 08/20/16   Dione Booze, MD  latanoprost (XALATAN) 0.005 % ophthalmic solution Place 1 drop into both eyes at bedtime.     [provider]  LATUDA 40 MG TABS tablet Take 1 tablet (40 mg total) by mouth at bedtime. 07/14/22   Joan Flores, NP  linaclotide (LINZESS) 145 MCG CAPS capsule Take 145 mcg by mouth daily as needed (ibs/ constipation).     [provider]  lisinopril (ZESTRIL) 10 MG tablet Take 10 mg by mouth daily. 07/02/22   [provider]  magnesium oxide (MAG-OX) 400 (240 Mg) MG tablet Take 1 tablet by mouth daily. 06/26/22  [provider]  metoprolol succinate (TOPROL-XL) 25 MG 24 hr tablet TAKE 1 TABLET BY MOUTH EVERY DAY 04/06/21   Yates Decamp, MD  mupirocin ointment (BACTROBAN) 2 % APPLY EXTERNALLY TO THE SKIN TWICE DAILY AS DIRECTED 10/01/19   [provider]  Omega-3 Fatty Acids (FISH OIL) 1200 MG CAPS Take 1,200 mg by mouth daily.     [provider]  pantoprazole (PROTONIX) 40 MG tablet Take 40 mg by mouth 2 (two) times daily. 09/03/19   [provider]  QUEtiapine (SEROQUEL)  100 MG tablet Take 1 tablet (100 mg total) by mouth at bedtime. 07/14/22   Joan Flores, NP  traZODone (DESYREL) 100 MG tablet Take 200 mg by mouth at bedtime. 09/05/19   [provider]  traZODone (DESYREL) 50 MG tablet Take 1 tablet (50 mg total) by mouth at bedtime. 07/14/22   Joan Flores, NP  umeclidinium bromide (INCRUSE ELLIPTA) 62.5 MCG/INH AEPB Inhale 1 puff into the lungs daily.    [provider]  Vitamin D, Ergocalciferol, (DRISDOL) 50000 units CAPS capsule Take 50,000 Units by mouth every Saturday.  06/16/16   [provider]      Allergies    Codeine, Diclofenac, Flagyl [metronidazole], Imipramine, Meloxicam, Metoprolol tartrate, Olanzapine, Other, and Vistaril [hydroxyzine hcl]    Review of Systems   Review of Systems  Cardiovascular:  Positive for chest pain.  All other systems reviewed and are negative.   Physical Exam Updated Vital Signs BP (!) 177/86 (BP Location: Right Arm)   Pulse 91   Temp 98.1 F (36.7 C) (Oral)   Resp 18   Ht 5\' 3"  (1.6 m)   Wt 78 kg   SpO2 98%   BMI 30.46 kg/m  Physical Exam Vitals and nursing note reviewed.  Constitutional:      General: She is not in acute distress.    Appearance: She is well-developed.  HENT:     Head: Normocephalic and atraumatic.  Eyes:     Conjunctiva/sclera: Conjunctivae normal.  Cardiovascular:     Rate and Rhythm: Normal rate and regular rhythm.     Heart sounds: No murmur heard. Pulmonary:     Effort: Pulmonary effort is normal. No respiratory distress.     Breath sounds: Normal breath sounds. No wheezing, rhonchi or rales.  Abdominal:     Palpations: Abdomen is soft.     Tenderness: There is no abdominal tenderness.  Musculoskeletal:        General: No swelling.     Cervical back: Neck supple.     Right lower leg: No edema.     Left lower leg: No edema.     Comments: No pitting edema noted bilateral lower extremities.  Skin:    General: Skin is warm and dry.      Capillary Refill: Capillary refill takes less than 2 seconds.  Neurological:     Mental Status: She is alert.  Psychiatric:        Mood and Affect: Mood normal.     ED Results / Procedures / Treatments   Labs (all labs ordered are listed, but only abnormal results are displayed) Labs Reviewed  BASIC METABOLIC PANEL - Abnormal; Notable for the following components:      Result Value   Glucose, Bld 171 (*)    BUN 7 (*)    Creatinine, Ser 1.09 (*)    Calcium 8.7 (*)    GFR, Estimated 55 (*)    All other components within  normal limits  CBC WITH DIFFERENTIAL/PLATELET - Abnormal; Notable for the following components:   RBC 3.81 (*)    MCV 100.5 (*)    All other components within normal limits  D-DIMER, QUANTITATIVE - Abnormal; Notable for the following components:   D-Dimer, Quant 0.67 (*)    All other components within normal limits  TROPONIN I (HIGH SENSITIVITY)  TROPONIN I (HIGH SENSITIVITY)    EKG EKG Interpretation  Date/Time:  Thursday August 03 2022 13:38:34 EDT Ventricular Rate:  65 PR Interval:  142 QRS Duration: 112 QT Interval:  456 QTC Calculation: 474 R Axis:   -12 Text Interpretation: Normal sinus rhythm Moderate voltage criteria for LVH, may be normal variant ( R in aVL , Cornell product ) Cannot rule out Anterior infarct , age undetermined Abnormal ECG When compared with ECG of 14-Sep-2019 05:49, PREVIOUS ECG IS PRESENT Confirmed by Marily MemosMesner, Jason [phone]) on 08/04/2022 12:16:02 PM  Radiology CT Angio Chest PE W/Cm &/Or Wo Cm  Result Date: 08/04/2022 CLINICAL DATA:  Pulmonary embolism (PE) suspected, positive D-dimer EXAM: CT ANGIOGRAPHY CHEST WITH CONTRAST TECHNIQUE: Multidetector CT imaging of the chest was performed using the standard protocol during bolus administration of intravenous contrast. Multiplanar CT image reconstructions and MIPs were obtained to evaluate the vascular anatomy. RADIATION DOSE REDUCTION: This exam was performed according to the  departmental dose-optimization program which includes automated exposure control, adjustment of the mA and/or kV according to patient size and/or use of iterative reconstruction technique. CONTRAST:  80mL OMNIPAQUE IOHEXOL 350 MG/ML SOLN COMPARISON:  X-ray 08/03/2022.  CT 12/28/2014 FINDINGS: Cardiovascular: Satisfactory opacification of the pulmonary arteries to the segmental level. No evidence of pulmonary embolism. Thoracic aorta normal in course and caliber. Normal heart size. No pericardial effusion. Mediastinum/Nodes: No axillary, mediastinal, or hilar lymphadenopathy by size criteria. Subcentimeter nodule within the left thyroid lobe, which does not require follow-up imaging. Trachea within normal limits. Small hiatal hernia. Esophagus otherwise within normal limits. Lungs/Pleura: Minimal linear scarring/atelectasis within the lung bases. Lungs otherwise clear. No pleural effusion or pneumothorax. Upper Abdomen: 1.0 cm right adrenal gland nodule with internal density of -13 HU, consistent with a benign adenoma. No follow-up imaging recommended. No acute findings within the upper abdomen. Musculoskeletal: No chest wall abnormality. No acute or significant osseous findings. Review of the MIP images confirms the above findings. IMPRESSION: 1. No acute pulmonary embolism or other acute cardiopulmonary findings. 2. Small hiatal hernia. Electronically Signed   By: Duanne GuessNicholas  Plundo D.O.   On: 08/04/2022 12:25   DG Chest 2 View  Result Date: 08/03/2022 CLINICAL DATA:  Chest pain, shortness of breath. EXAM: CHEST - 2 VIEW COMPARISON:  November 05, 2016. FINDINGS: The heart size and mediastinal contours are within normal limits. Left lung is clear. Minimal right basilar subsegmental atelectasis or scarring is noted. The visualized skeletal structures are unremarkable. IMPRESSION: Minimal right basilar subsegmental atelectasis or scarring. Electronically Signed   By: Lupita RaiderJames  Green Jr M.D.   On: 08/03/2022 13:53     Procedures Procedures    Medications Ordered in ED Medications  oxyCODONE-acetaminophen (PERCOCET/ROXICET) 5-325 MG per tablet 1 tablet (1 tablet Oral Given 08/04/22 1346)  famotidine (PEPCID) tablet 20 mg (has no administration in time range)  alum & mag hydroxide-simeth (MAALOX/MYLANTA) 200-200-20 MG/5ML suspension 30 mL (has no administration in time range)    And  lidocaine (XYLOCAINE) 2 % viscous mouth solution 15 mL (has no administration in time range)  ibuprofen (ADVIL) tablet 600 mg (600 mg Oral Given 08/03/22 1523)  iohexol (OMNIPAQUE) 350 MG/ML injection 80 mL (80 mLs Intravenous Contrast Given 08/04/22 1212)    ED Course/ Medical Decision Making/ A&P                           Medical Decision Making Risk Prescription drug management.   This patient presents to the ED for concern of shortness of breath, this involves an extensive number of treatment options, and is a complaint that carries with it a high risk of complications and morbidity.  The differential diagnosis includes ACS, CHF, pericardial effusion, tamponade, arrhythmia, COPD/asthma exacerbation, pneumonia, pneumothorax, pulmonary embolism, aortic dissection, aortic aneurysm, MSK, gastritis  Co morbidities that complicate the patient evaluation  See HPI   Additional history obtained:  Additional history obtained from EMR External records from outside source obtained and reviewed including hospital records   Lab Tests:  I Ordered, and personally interpreted labs.  The pertinent results include: No leukocytosis.  No evidence anemia.  Platelets within normal range.  D-dimer slightly elevated with 0.67 of which PE study was ordered which was negative for pulmonary embolism.  No electrolyte abnormalities noted.  Renal function within patient's baseline with a BUN of 7, creatinine of 1.09 and GFR 55.  Initial troponin of 8 with a delta 7; nonspecific ST changes in the anterior leads with no reciprocal changes  on EKG.   Imaging Studies ordered:  Imaging studies ordered from triage including chest x-ray, CT angio chest PE I independently visualized and interpreted imaging which showed  Chest x-ray: Minimal right basilar subsegmental atelectasis or scarring. CT angio PE: No acute PE or any other acute cardiopulmonary finding.  Small hiatal hernia. I agree with the radiologist interpretation  Cardiac Monitoring: / EKG:  The patient was maintained on a cardiac monitor.  I personally viewed and interpreted the cardiac monitored which showed an underlying rhythm of: Sinus rhythm with nonspecific ST changes in the anterior leads with no reciprocal changes.  Consultations Obtained:  N/a   Problem List / ED Course / Critical interventions / Medication management  Chest pain Medication ordered from triage area of Advil and Percocet for pain which both improved the patient's pain significantly.  I ordered medication including famotidine and Maalox for reflux/gastritis which significantly improved patient's symptoms.    Reevaluation of the patient after these medicines showed that the patient improved I have reviewed the patients home medicines and have made adjustments as needed   Social Determinants of Health:  Current cigarette use.  Denies illicit drug use.   Test / Admission - Considered:  Chest pain Vitals signs significant for hypertension with a blood pressure 177/86.  Patient has not received at home medicines since being in the emergency department overnight.  Likely due to under treatment.  Recommend continuation of at home blood pressure medications with follow-up by primary care provider.. Otherwise within normal range and stable throughout visit. Laboratory/imaging studies significant for: See above Patient has a heart pathway score of 3 with 0.9 to 1.7% of 30-day Mace score; doubt ACS.  Patient's CT PE study negative for any acute PE so doubt PE.  Not suspicious story for  AAA/aortic dissection.  Patient noted significant improvement with administration of GI cocktail while in the emergency department.  Recommend continue outpatient therapy with similar medications.  Close follow-up with PCP recommended in 2 to 3 days for reevaluation of symptoms.   Treatment plan discussed at length with patient she acknowledged understand was agreeable to said  plan.  Worrisome signs and symptoms were discussed with the patient, and the patient acknowledged understanding to return to the ED if noticed. Patient was stable upon discharge.          Final Clinical Impression(s) / ED Diagnoses Final diagnoses:  Chest pain, unspecified type    Rx / DC Orders ED Discharge Orders          Ordered    famotidine (PEPCID) 20 MG tablet  2 times daily        08/04/22 1422              Wilnette Kales, [place] 08/04/22 1422    Lacretia Leigh, MD 08/05/22 1335

## 2022-08-04 NOTE — ED Notes (Signed)
Patient transported to CT 

## 2022-08-04 NOTE — Discharge Instructions (Signed)
Note the work-up today was overall reassuring.  Recommend continue at home medication of Tylenol/ibuprofen as needed for pain.  Recommend close follow-up with PCP in 2 to 3 days for reevaluation of your symptoms.  Since you have found relief of symptoms with medicines while in the emergency department, recommend continued Pepcid at home which I will send in prescription of as well as Maalox which she can find over-the-counter.  Please do not hesitate to return to emergency department for worrisome signs symptoms we discussed, parent.

## 2022-08-04 NOTE — ED Notes (Signed)
Pt. Continues to have lt. Shoulder pain  8/10, has requested pain medication.

## 2022-09-12 ENCOUNTER — Telehealth: Payer: Self-pay | Admitting: Behavioral Health

## 2022-09-12 ENCOUNTER — Telehealth: Payer: Medicare HMO | Admitting: Behavioral Health

## 2022-09-12 NOTE — Telephone Encounter (Signed)
Tried calling patient again. Was able to reach her and told her that she had RF at the pharmacy.

## 2022-09-12 NOTE — Telephone Encounter (Signed)
LVM to RC. Patient has refills available at the pharmacy of all requested.

## 2022-09-12 NOTE — Telephone Encounter (Signed)
Pt would like refill of Latuda, Quetiapine and Trazadone to   [id number] [place] Otto, [place] - [address] AT [place] [facility] [address] [place] [address] Phone: [phone]  Fax: [phone]   Next appt 11/29

## 2022-09-13 ENCOUNTER — Ambulatory Visit (INDEPENDENT_AMBULATORY_CARE_PROVIDER_SITE_OTHER): Payer: Medicare HMO | Admitting: Behavioral Health

## 2022-09-13 ENCOUNTER — Encounter: Payer: Self-pay | Admitting: Behavioral Health

## 2022-09-13 DIAGNOSIS — F319 Bipolar disorder, unspecified: Secondary | ICD-10-CM | POA: Diagnosis not present

## 2022-09-13 DIAGNOSIS — F411 Generalized anxiety disorder: Secondary | ICD-10-CM | POA: Diagnosis not present

## 2022-09-13 DIAGNOSIS — F5105 Insomnia due to other mental disorder: Secondary | ICD-10-CM | POA: Diagnosis not present

## 2022-09-13 MED ORDER — QUETIAPINE FUMARATE 100 MG PO TABS: 150.0000 mg | ORAL_TABLET | Freq: Every day | ORAL | 1 refills | Status: DC

## 2022-09-13 MED ORDER — TRAZODONE HCL 100 MG PO TABS: 150.0000 mg | ORAL_TABLET | Freq: Every day | ORAL | 1 refills | Status: DC

## 2022-09-13 NOTE — Progress Notes (Signed)
Cassie Harrell [id number] December 12, 1950 71 y.o.  Virtual Visit via Telephone Note  I connected with pt on 09/13/22 at 11:00 AM EST by telephone and verified that I am speaking with the correct person using two identifiers.   I discussed the limitations, risks, security and privacy concerns of performing an evaluation and management service by telephone and the availability of in person appointments. I also discussed with the patient that there may be a patient responsible charge related to this service. The patient expressed understanding and agreed to proceed.   I discussed the assessment and treatment plan with the patient. The patient was provided an opportunity to ask questions and all were answered. The patient agreed with the plan and demonstrated an understanding of the instructions.   The patient was advised to call back or seek an in-person evaluation if the symptoms worsen or if the condition fails to improve as anticipated.  I provided 30 minutes of non-face-to-face time during this encounter.  The patient was located at home.  The provider was located at [facility].   Joan Flores, NP   Subjective:   Patient ID:  Cassie Harrell is a 71 y.o. (DOB [birth year]) female.  Chief Complaint:  Chief Complaint  Patient presents with   Depression   Anxiety   Insomnia   Follow-up   Medication Problem   Patient Education   Medication Refill    HPI  "Cassie Harrell", 71 year old African-American female presents via telphonic interview for follow up and medication management.  She says that she has been doing well overall but recently started to decline again with sleep. She is requesting her medications be adjusted to help.   She reports anxiety as 1/10, and depression as 1/10.  Her last episode of bipolar depression was approximately 8 months ago. Sleeping right now about 5-6 hours per night. Waking up around 5 am. She is currently single with 1 grown son.  She  is currently in a relationship.  She is retired with disability.  She denies any recent mania, no psychosis, no auditory or visual hallucinations.  No SI or HI.  She states that she feels safe with no additional risk.   Unable to obtain previous psychotropic medication list at this time.  Patient will attempt to obtain records.  Review of Systems:  Review of Systems  Constitutional: Negative.   Allergic/Immunologic: Negative.   Neurological: Negative.   Psychiatric/Behavioral:  Positive for dysphoric mood.     Medications: I have reviewed the patient's current medications.  Current Outpatient Medications  Medication Sig Dispense Refill   albuterol (PROVENTIL HFA;VENTOLIN HFA) 108 (90 BASE) MCG/ACT inhaler Inhale 2 puffs into the lungs every 6 (six) hours as needed for wheezing or shortness of breath.      amLODipine (NORVASC) 5 MG tablet Take 5 mg by mouth daily.     brimonidine-timolol (COMBIGAN) 0.2-0.5 % ophthalmic solution Place 1 drop into both eyes every 12 (twelve) hours.      clonazePAM (KLONOPIN) 0.5 MG tablet Take 1-2 tablets (0.5-1 mg total) by mouth daily as needed. 30 tablet 3   estradiol (ESTRACE) 0.1 MG/GM vaginal cream Place vaginally.     famotidine (PEPCID) 20 MG tablet Take 1 tablet (20 mg total) by mouth 2 (two) times daily. 30 tablet 0   furosemide (LASIX) 20 MG tablet Take 20 mg by mouth 2 (two) times daily.     gabapentin (NEURONTIN) 100 MG capsule Take 100 mg by mouth at bedtime.  ibandronate (BONIVA) 150 MG tablet Take 150 mg by mouth every 30 (thirty) days.     ibuprofen (ADVIL,MOTRIN) 800 MG tablet Take 1 tablet (800 mg total) by mouth 3 (three) times daily. (Patient taking differently: Take 800 mg by mouth as needed.) 30 tablet 0   latanoprost (XALATAN) 0.005 % ophthalmic solution Place 1 drop into both eyes at bedtime.      LATUDA 40 MG TABS tablet Take 1 tablet (40 mg total) by mouth at bedtime. 30 tablet 3   linaclotide (LINZESS) 145 MCG CAPS capsule Take  145 mcg by mouth daily as needed (ibs/ constipation).      lisinopril (ZESTRIL) 10 MG tablet Take 10 mg by mouth daily.     magnesium oxide (MAG-OX) 400 (240 Mg) MG tablet Take 1 tablet by mouth daily.     metoprolol succinate (TOPROL-XL) 25 MG 24 hr tablet TAKE 1 TABLET BY MOUTH EVERY DAY 90 tablet 3   mupirocin ointment (BACTROBAN) 2 % APPLY EXTERNALLY TO THE SKIN TWICE DAILY AS DIRECTED     Omega-3 Fatty Acids (FISH OIL) 1200 MG CAPS Take 1,200 mg by mouth daily.      pantoprazole (PROTONIX) 40 MG tablet Take 40 mg by mouth 2 (two) times daily.     QUEtiapine (SEROQUEL) 100 MG tablet Take 1.5 tablets (150 mg total) by mouth at bedtime. 135 tablet 1   traZODone (DESYREL) 100 MG tablet Take 1.5 tablets (150 mg total) by mouth at bedtime. 135 tablet 1   traZODone (DESYREL) 50 MG tablet Take 1 tablet (50 mg total) by mouth at bedtime. 90 tablet 1   umeclidinium bromide (INCRUSE ELLIPTA) 62.5 MCG/INH AEPB Inhale 1 puff into the lungs daily.     Vitamin D, Ergocalciferol, (DRISDOL) 50000 units CAPS capsule Take 50,000 Units by mouth every Saturday.   5   No current facility-administered medications for this visit.    Medication Side Effects: None  Allergies:  Allergies  Allergen Reactions   Codeine Hives and Nausea And Vomiting   Diclofenac Palpitations and Other (See Comments)    Other pains   Flagyl [Metronidazole] Other (See Comments)    Crazy    Imipramine Rash   Meloxicam Itching   Metoprolol Tartrate Itching and Rash   Olanzapine Other (See Comments)    Patient preference    Other Rash    Oil of olay moisturizing cream and eye cream caused measles like rash   Vistaril [Hydroxyzine Hcl] Itching and Rash    Past Medical History:  Diagnosis Date   Alcohol abuse with alcohol-induced mood disorder (HCC) 07/31/2016   Anxiety    Bipolar 1 disorder (HCC)    Cocaine abuse (HCC)    Constipation    COPD (chronic obstructive pulmonary disease) (HCC)    Dissociative disorder     Dysrhythmia    irregular heartrate   GERD (gastroesophageal reflux disease)    Glaucoma    Hypertension    Neuropathy of foot    bilateral   Osteoarthritis    osteoarthritis   PTSD (post-traumatic stress disorder)    PVD (peripheral vascular disease) (HCC) 2017   Sleep apnea    has had surgery    Family History  Problem Relation Age of Onset   Depression Mother    Anxiety disorder Mother    Other Mother        natural   Alcoholism Father    Alcohol abuse Father    Heart attack Father    Other Sister  kidney transplant   Goiter Sister    Diabetes Sister    Alcohol abuse Brother    Alcoholism Brother    Diabetes Brother     Social History   Socioeconomic History   Marital status: Single    Spouse name: Not on file   Number of children: 1   Years of education: 14   Highest education level: Associate degree: occupational, Scientist, product/process development, or vocational program  Occupational History    Employer: OTHER    Comment: disabled   Occupation: Retired  Tobacco Use   Smoking status: Every Day    Packs/day: 0.25    Years: 20.00    Total pack years: 5.00    Types: Cigarettes   Smokeless tobacco: Never  Vaping Use   Vaping Use: Never used  Substance and Sexual Activity   Alcohol use: Not Currently   Drug use: Yes    Types: Cocaine    Comment: none since 2015/16   Sexual activity: Yes  Other Topics Concern   Not on file  Social History Narrative   Patient lives at home alone.   Caffeine Use: quit 2 yrs ago   Social Determinants of Corporate investment banker Strain: Not on file  Food Insecurity: Not on file  Transportation Needs: Not on file  Physical Activity: Not on file  Stress: Not on file  Social Connections: Not on file  Intimate Partner Violence: Not on file    Past Medical History, Surgical history, Social history, and Family history were reviewed and updated as appropriate.   Please see review of systems for further details on the patient's review  from today.   Objective:   Physical Exam:  There were no vitals taken for this visit.  Physical Exam Psychiatric:        Attention and Perception: Attention and perception normal.        Mood and Affect: Mood and affect normal.        Speech: Speech normal.        Behavior: Behavior normal. Behavior is cooperative.        Cognition and Memory: Cognition and memory normal.        Judgment: Judgment normal.     Lab Review:     Component Value Date/Time   NA 139 08/03/2022 1342   K 4.0 08/03/2022 1342   CL 107 08/03/2022 1342   CO2 22 08/03/2022 1342   GLUCOSE 171 (H) 08/03/2022 1342   BUN 7 (L) 08/03/2022 1342   CREATININE 1.09 (H) 08/03/2022 1342   CREATININE 1.02 10/26/2021 0256   CALCIUM 8.7 (L) 08/03/2022 1342   PROT 6.1 06/16/2021 0000   ALBUMIN 3.9 09/13/2019 2126   AST 17 06/16/2021 0000   ALT 8 06/16/2021 0000   ALKPHOS 54 09/13/2019 2126   BILITOT 0.5 06/16/2021 0000   GFRNONAA 55 (L) 08/03/2022 1342   GFRAA >60 09/13/2019 2126       Component Value Date/Time   WBC 6.5 08/03/2022 1342   RBC 3.81 (L) 08/03/2022 1342   HGB 12.1 08/03/2022 1342   HCT 38.3 08/03/2022 1342   PLT 305 08/03/2022 1342   MCV 100.5 (H) 08/03/2022 1342   MCH 31.8 08/03/2022 1342   MCHC 31.6 08/03/2022 1342   RDW 14.5 08/03/2022 1342   LYMPHSABS 2.6 08/03/2022 1342   MONOABS 0.5 08/03/2022 1342   EOSABS 0.2 08/03/2022 1342   BASOSABS 0.1 08/03/2022 1342    No results found for: "POCLITH", "LITHIUM"   No results  found for: "PHENYTOIN", "PHENOBARB", "VALPROATE", "CBMZ"   .res Assessment: Plan:     Recommendations  Greater than 50% of  30 min telephonic interview with patient was spent on counseling and coordination of care.  We discussed her current level of reported stability. She is complaining of waking up around 5 am and not getting enough sleep. She is requesting adjustment to her medications to help sleep.  We agreed to: To increase trazodone to 150 mg daily at  bedtime To continue Seroquel to 150 mg at bedtime To continue Klonopin 0.5 mg 1-2 tablets daily as needed for severe anxiety Continue Latuda 40 mg daily. Take with 350 calorie meal or snack. To report side effects or worsening symptoms promptly To follow up in 8 weeks to reasess Provided emergency contact information Reviewed PDMP   Discussed potential metabolic side effects associated with atypical antipsychotics, as well as potential risk for movement side effects. Advised pt to contact office if movement side effects occur.       Cassie Harrell was seen today for depression, anxiety, insomnia, follow-up, medication problem, patient education and medication refill.  Diagnoses and all orders for this visit:  Generalized anxiety disorder -     QUEtiapine (SEROQUEL) 100 MG tablet; Take 1.5 tablets (150 mg total) by mouth at bedtime.  Bipolar 1 disorder (HCC) -     QUEtiapine (SEROQUEL) 100 MG tablet; Take 1.5 tablets (150 mg total) by mouth at bedtime.  Insomnia due to other mental disorder -     QUEtiapine (SEROQUEL) 100 MG tablet; Take 1.5 tablets (150 mg total) by mouth at bedtime. -     traZODone (DESYREL) 100 MG tablet; Take 1.5 tablets (150 mg total) by mouth at bedtime.    Please see After Visit Summary for patient specific instructions.  Future Appointments  Date Time Provider Department Center  11/14/2022 11:00 AM Avelina Laine A, NP [facility] None    No orders of the defined types were placed in this encounter.     -------------------------------

## 2022-10-06 ENCOUNTER — Encounter (HOSPITAL_BASED_OUTPATIENT_CLINIC_OR_DEPARTMENT_OTHER): Payer: Self-pay

## 2022-10-06 DIAGNOSIS — R0681 Apnea, not elsewhere classified: Secondary | ICD-10-CM

## 2022-11-10 ENCOUNTER — Telehealth: Payer: Self-pay | Admitting: Behavioral Health

## 2022-11-10 NOTE — Telephone Encounter (Signed)
Patient lvm stating her Anette Guarneri is not on the list anymore per [employer]. She stated the insurance said she could have her provider appeal and that the medication is necessary Contact Tiny at # [phone].

## 2022-11-14 ENCOUNTER — Encounter: Payer: Self-pay | Admitting: Behavioral Health

## 2022-11-14 ENCOUNTER — Ambulatory Visit (INDEPENDENT_AMBULATORY_CARE_PROVIDER_SITE_OTHER): Payer: Medicare HMO | Admitting: Behavioral Health

## 2022-11-14 DIAGNOSIS — F411 Generalized anxiety disorder: Secondary | ICD-10-CM

## 2022-11-14 DIAGNOSIS — F5105 Insomnia due to other mental disorder: Secondary | ICD-10-CM | POA: Diagnosis not present

## 2022-11-14 DIAGNOSIS — F319 Bipolar disorder, unspecified: Secondary | ICD-10-CM

## 2022-11-14 DIAGNOSIS — F99 Mental disorder, not otherwise specified: Secondary | ICD-10-CM

## 2022-11-14 MED ORDER — LATUDA 40 MG PO TABS: 40.0000 mg | ORAL_TABLET | Freq: Every day | ORAL | 3 refills | Status: DC

## 2022-11-14 MED ORDER — TRAZODONE HCL 100 MG PO TABS: 150.0000 mg | ORAL_TABLET | Freq: Every day | ORAL | 1 refills | Status: DC

## 2022-11-14 MED ORDER — QUETIAPINE FUMARATE 100 MG PO TABS: 150.0000 mg | ORAL_TABLET | Freq: Every day | ORAL | 1 refills | Status: DC

## 2022-11-14 NOTE — Progress Notes (Signed)
Cassie Harrell [id number] [birth year] 72 y.o.  Virtual Visit via Telephone Note  I connected with pt on 11/14/22 at 11:00 AM EST by telephone and verified that I am speaking with the correct person using two identifiers.   I discussed the limitations, risks, security and privacy concerns of performing an evaluation and management service by telephone and the availability of in person appointments. I also discussed with the patient that there may be a patient responsible charge related to this service. The patient expressed understanding and agreed to proceed.   I discussed the assessment and treatment plan with the patient. The patient was provided an opportunity to ask questions and all were answered. The patient agreed with the plan and demonstrated an understanding of the instructions.   The patient was advised to call back or seek an in-person evaluation if the symptoms worsen or if the condition fails to improve as anticipated.  I provided 30 minutes of non-face-to-face time during this encounter.  The patient was located at home.  The provider was located at [place].   Elwanda Brooklyn, NP   Subjective:   Patient ID:  Cassie Harrell is a 72 y.o. (DOB Dec 23, 1950) female.  Chief Complaint:  Chief Complaint  Patient presents with   Depression   Anxiety   Follow-up   Medication Problem   Patient Education    HPI "Cassie Harrell", 72 year old African-American female presents via telphonic interview for follow up and medication management.  Says that she has been doing very overall and Latuda has continued to maintain her stability. She continues to have some concern about weight gain from Seroquel.  She reports anxiety as 1/10, and depression as 1/10.  Her last episode of bipolar depression was approximately 12 months ago. Sleeping right now about 6-7 hours per night.  She is currently in a relationship.  She is retired with disability.  She denies any recent  mania, no psychosis, no auditory or visual hallucinations.  No SI or HI.  She states that she feels safe with no additional risk.     Review of Systems:  Review of Systems  Constitutional: Negative.   Allergic/Immunologic: Negative.   Neurological: Negative.   Psychiatric/Behavioral:  Positive for dysphoric mood. The patient is nervous/anxious.     Medications: I have reviewed the patient's current medications.  Current Outpatient Medications  Medication Sig Dispense Refill   albuterol (PROVENTIL HFA;VENTOLIN HFA) 108 (90 BASE) MCG/ACT inhaler Inhale 2 puffs into the lungs every 6 (six) hours as needed for wheezing or shortness of breath.      amLODipine (NORVASC) 5 MG tablet Take 5 mg by mouth daily.     brimonidine-timolol (COMBIGAN) 0.2-0.5 % ophthalmic solution Place 1 drop into both eyes every 12 (twelve) hours.      estradiol (ESTRACE) 0.1 MG/GM vaginal cream Place vaginally.     famotidine (PEPCID) 20 MG tablet Take 1 tablet (20 mg total) by mouth 2 (two) times daily. 30 tablet 0   furosemide (LASIX) 20 MG tablet Take 20 mg by mouth 2 (two) times daily.     gabapentin (NEURONTIN) 100 MG capsule Take 100 mg by mouth at bedtime.     ibandronate (BONIVA) 150 MG tablet Take 150 mg by mouth every 30 (thirty) days.     latanoprost (XALATAN) 0.005 % ophthalmic solution Place 1 drop into both eyes at bedtime.      linaclotide (LINZESS) 145 MCG CAPS capsule Take 145 mcg by mouth daily as needed (ibs/ constipation).  lisinopril (ZESTRIL) 10 MG tablet Take 10 mg by mouth daily.     magnesium oxide (MAG-OX) 400 (240 Mg) MG tablet Take 1 tablet by mouth daily.     metoprolol succinate (TOPROL-XL) 25 MG 24 hr tablet TAKE 1 TABLET BY MOUTH EVERY DAY 90 tablet 3   Omega-3 Fatty Acids (FISH OIL) 1200 MG CAPS Take 1,200 mg by mouth daily.      pantoprazole (PROTONIX) 40 MG tablet Take 40 mg by mouth 2 (two) times daily.     umeclidinium bromide (INCRUSE ELLIPTA) 62.5 MCG/INH AEPB Inhale 1  puff into the lungs daily.     Vitamin D, Ergocalciferol, (DRISDOL) 50000 units CAPS capsule Take 50,000 Units by mouth every Saturday.   5   ibuprofen (ADVIL,MOTRIN) 800 MG tablet Take 1 tablet (800 mg total) by mouth 3 (three) times daily. (Patient taking differently: Take 800 mg by mouth as needed.) 30 tablet 0   LATUDA 40 MG TABS tablet Take 1 tablet (40 mg total) by mouth at bedtime. 30 tablet 3   QUEtiapine (SEROQUEL) 100 MG tablet Take 1.5 tablets (150 mg total) by mouth at bedtime. 135 tablet 1   traZODone (DESYREL) 100 MG tablet Take 1.5 tablets (150 mg total) by mouth at bedtime. 135 tablet 1   No current facility-administered medications for this visit.    Medication Side Effects: None  Allergies:  Allergies  Allergen Reactions   Codeine Hives and Nausea And Vomiting   Diclofenac Palpitations and Other (See Comments)    Other pains   Flagyl [Metronidazole] Other (See Comments)    Crazy    Imipramine Rash   Meloxicam Itching   Metoprolol Tartrate Itching and Rash   Olanzapine Other (See Comments)    Patient preference    Other Rash    Oil of olay moisturizing cream and eye cream caused measles like rash   Vistaril [Hydroxyzine Hcl] Itching and Rash    Past Medical History:  Diagnosis Date   Alcohol abuse with alcohol-induced mood disorder (HCC) 07/31/2016   Anxiety    Bipolar 1 disorder (HCC)    Cocaine abuse (HCC)    Constipation    COPD (chronic obstructive pulmonary disease) (HCC)    Dissociative disorder    Dysrhythmia    irregular heartrate   GERD (gastroesophageal reflux disease)    Glaucoma    Hypertension    Neuropathy of foot    bilateral   Osteoarthritis    osteoarthritis   PTSD (post-traumatic stress disorder)    PVD (peripheral vascular disease) (HCC) 2017   Sleep apnea    has had surgery    Family History  Problem Relation Age of Onset   Depression Mother    Anxiety disorder Mother    Other Mother        natural   Alcoholism Father     Alcohol abuse Father    Heart attack Father    Other Sister        kidney transplant   Goiter Sister    Diabetes Sister    Alcohol abuse Brother    Alcoholism Brother    Diabetes Brother     Social History   Socioeconomic History   Marital status: Single    Spouse name: Not on file   Number of children: 1   Years of education: 14   Highest education level: Associate degree: occupational, Scientist, product/process development, or vocational program  Occupational History    Employer: OTHER    Comment: disabled   Occupation:  Retired  Tobacco Use   Smoking status: Every Day    Packs/day: 0.25    Years: 20.00    Total pack years: 5.00    Types: Cigarettes   Smokeless tobacco: Never  Vaping Use   Vaping Use: Never used  Substance and Sexual Activity   Alcohol use: Not Currently   Drug use: Yes    Types: Cocaine    Comment: none since 2015/16   Sexual activity: Yes  Other Topics Concern   Not on file  Social History Narrative   Patient lives at home alone.   Caffeine Use: quit 2 yrs ago   Social Determinants of Radio broadcast assistant Strain: Not on file  Food Insecurity: Not on file  Transportation Needs: Not on file  Physical Activity: Not on file  Stress: Not on file  Social Connections: Not on file  Intimate Partner Violence: Not on file    Past Medical History, Surgical history, Social history, and Family history were reviewed and updated as appropriate.   Please see review of systems for further details on the patient's review from today.   Objective:   Physical Exam:  There were no vitals taken for this visit.  Physical Exam Neurological:     Mental Status: She is alert and oriented to person, place, and time.  Psychiatric:        Attention and Perception: Attention and perception normal.        Mood and Affect: Mood normal.        Speech: Speech normal.        Behavior: Behavior normal. Behavior is cooperative.        Cognition and Memory: Cognition and memory  normal.        Judgment: Judgment normal.     Comments: Insight intact     Lab Review:     Component Value Date/Time   NA 139 08/03/2022 1342   K 4.0 08/03/2022 1342   CL 107 08/03/2022 1342   CO2 22 08/03/2022 1342   GLUCOSE 171 (H) 08/03/2022 1342   BUN 7 (L) 08/03/2022 1342   CREATININE 1.09 (H) 08/03/2022 1342   CREATININE 1.02 10/26/2021 0256   CALCIUM 8.7 (L) 08/03/2022 1342   PROT 6.1 06/16/2021 0000   ALBUMIN 3.9 09/13/2019 2126   AST 17 06/16/2021 0000   ALT 8 06/16/2021 0000   ALKPHOS 54 09/13/2019 2126   BILITOT 0.5 06/16/2021 0000   GFRNONAA 55 (L) 08/03/2022 1342   GFRAA >60 09/13/2019 2126       Component Value Date/Time   WBC 6.5 08/03/2022 1342   RBC 3.81 (L) 08/03/2022 1342   HGB 12.1 08/03/2022 1342   HCT 38.3 08/03/2022 1342   PLT 305 08/03/2022 1342   MCV 100.5 (H) 08/03/2022 1342   MCH 31.8 08/03/2022 1342   MCHC 31.6 08/03/2022 1342   RDW 14.5 08/03/2022 1342   LYMPHSABS 2.6 08/03/2022 1342   MONOABS 0.5 08/03/2022 1342   EOSABS 0.2 08/03/2022 1342   BASOSABS 0.1 08/03/2022 1342    No results found for: "POCLITH", "LITHIUM"   No results found for: "PHENYTOIN", "PHENOBARB", "VALPROATE", "CBMZ"   .res Assessment: Plan:    Recommendations   Greater than 50% of  30 min telephonic interview with patient was spent on counseling and coordination of care.  We discussed her current level of reported stability. She is concerned about insurance not wanting to pay for Latuda. She has struggled over the years and so far [place] has  been working very well to control symptoms of Bipolar depression. I do not recommend changing medication at this time due to risk of de stabilization and hospitalization. She has extensive hx of prior hospitalizations.   We agreed to: To continue trazodone to 150 mg daily at bedtime To continue Seroquel to 150 mg at bedtime To continue Klonopin 0.5 mg 1-2 tablets daily as needed for severe anxiety Continue Latuda 40 mg  daily. Take with 350 calorie meal or snack. To report side effects or worsening symptoms promptly To follow up in 12 weeks to reasess Provided emergency contact information Reviewed PDMP   Discussed potential metabolic side effects associated with atypical antipsychotics, as well as potential risk for movement side effects. Advised pt to contact office if movement side effects occur.        Allex was seen today for depression, anxiety, follow-up, medication problem and patient education.  Diagnoses and all orders for this visit:  Bipolar 1 disorder ([place]) -     LATUDA 40 MG TABS tablet; Take 1 tablet (40 mg total) by mouth at bedtime. -     QUEtiapine (SEROQUEL) 100 MG tablet; Take 1.5 tablets (150 mg total) by mouth at bedtime.  Generalized anxiety disorder -     QUEtiapine (SEROQUEL) 100 MG tablet; Take 1.5 tablets (150 mg total) by mouth at bedtime.  Insomnia due to other mental disorder -     QUEtiapine (SEROQUEL) 100 MG tablet; Take 1.5 tablets (150 mg total) by mouth at bedtime. -     traZODone (DESYREL) 100 MG tablet; Take 1.5 tablets (150 mg total) by mouth at bedtime.    Please see After Visit Summary for patient specific instructions.  Future Appointments  Date Time Provider [place]  11/23/2022  8:00 PM Deneise Lever, MD [facility]  02/12/2023 11:00 AM Lesle Chris A, NP [facility] None    No orders of the defined types were placed in this encounter.     -------------------------------

## 2022-11-15 NOTE — Telephone Encounter (Signed)
PA submitted for Brand Latuda 40 mg with [employer] Part D effective through 10/16/2023

## 2022-11-15 NOTE — Telephone Encounter (Signed)
I will submit a prior authorization for her Latuda I just need to know why she is taking Brand?

## 2022-11-23 ENCOUNTER — Encounter (HOSPITAL_BASED_OUTPATIENT_CLINIC_OR_DEPARTMENT_OTHER): Payer: Medicare HMO | Admitting: Internal Medicine

## 2023-01-02 ENCOUNTER — Ambulatory Visit (HOSPITAL_BASED_OUTPATIENT_CLINIC_OR_DEPARTMENT_OTHER): Payer: Medicare HMO | Attending: Internal Medicine | Admitting: Internal Medicine

## 2023-01-02 VITALS — Ht 63.0 in | Wt 172.0 lb

## 2023-01-02 DIAGNOSIS — G4700 Insomnia, unspecified: Secondary | ICD-10-CM | POA: Diagnosis not present

## 2023-01-02 DIAGNOSIS — R0683 Snoring: Secondary | ICD-10-CM | POA: Diagnosis present

## 2023-01-02 DIAGNOSIS — R0681 Apnea, not elsewhere classified: Secondary | ICD-10-CM

## 2023-01-03 ENCOUNTER — Telehealth: Payer: Self-pay | Admitting: Behavioral Health

## 2023-01-03 NOTE — Telephone Encounter (Signed)
Next visit is 02/12/23. Cassie Harrell had a sleep study done recently and got a CPAP machine. The tech told her she may have to do another one. She had been anxious, sleepy and depressed. Could someone please call her at [phone].

## 2023-01-03 NOTE — Telephone Encounter (Signed)
Patient c/o not sleeping well for 2-3 mo. She is able to get to sleep, but can't stay asleep, usually wakes around 4:00. She said she is anxious and irritable. She quit smoking 12/13/22 and feels that maybe that is playing a role in anxiety and irritability.  She is asking if she can change meds now, or should she wait.  Next appt is 4/29. She said a previous provider had increased Latuda to 60 mg, but she had increased HR and BP and was not able tolerate that dose.  She has had witnessed apnea and she said sleep study may need to be repeated, though was told it could take several weeks to get results.  Patient very pleasant and gave detailed history with this Probation officer.

## 2023-01-04 NOTE — Telephone Encounter (Signed)
Patient notified

## 2023-01-06 DIAGNOSIS — R0681 Apnea, not elsewhere classified: Secondary | ICD-10-CM | POA: Diagnosis not present

## 2023-01-06 NOTE — Procedures (Signed)
       Patient Name: Cassie Harrell, [place] Date: 01/02/2023 Gender: Female D.O.B: 02/08/1951 Age (years): 89 Referring Provider: Nolene Ebbs Height (inches): 63 Interpreting Physician: Baird Lyons MD, ABSM Weight (lbs): 172 RPSGT: Laren Everts BMI: 30 MRN: [id number] Neck Size: 13.00  CLINICAL INFORMATION Sleep Study Type: NPSG Indication for sleep study: COPD, Fatigue, Hypertension, Obesity, Snoring, Witnessed Apneas Epworth Sleepiness Score: 0  SLEEP STUDY TECHNIQUE As per the AASM Manual for the Scoring of Sleep and Associated Events v2.3 (April 2016) with a hypopnea requiring 4% desaturations.  The channels recorded and monitored were frontal, central and occipital EEG, electrooculogram (EOG), submentalis EMG (chin), nasal and oral airflow, thoracic and abdominal wall motion, anterior tibialis EMG, snore microphone, electrocardiogram, and pulse oximetry.  MEDICATIONS Medications self-administered by patient taken the night of the study : ATORVASTATIN, FAMOTIDINE, GABAPENTIN, LATUDA, SEROQUEL, TRAZODONE  SLEEP ARCHITECTURE The study was initiated at 10:11:56 PM and ended at 5:27:51 AM.  Sleep onset time was 80.3 minutes and the sleep efficiency was 36.4%. The total sleep time was 158.5 minutes.  Stage REM latency was N/A minutes.  The patient spent 9.8% of the night in stage N1 sleep, 90.2% in stage N2 sleep, 0.0% in stage N3 and 0% in REM.  Alpha intrusion was absent.  Supine sleep was 0.00%.  RESPIRATORY PARAMETERS The overall apnea/hypopnea index (AHI) was 0.0 per hour. There were 0 total apneas, including 0 obstructive, 0 central and 0 mixed apneas. There were 0 hypopneas and 7 RERAs.  The AHI during Stage REM sleep was N/A per hour.  AHI while supine was N/A per hour.  The mean oxygen saturation was 95.8%. The minimum SpO2 during sleep was 95.0%.  moderate snoring was noted during this study.  CARDIAC DATA The 2 lead EKG demonstrated sinus  rhythm. The mean heart rate was 57.3 beats per minute. Other EKG findings include: PVCs.  LEG MOVEMENT DATA The total PLMS were 0 with a resulting PLMS index of 0.0. Associated arousal with leg movement index was 0.0 .  IMPRESSIONS - No significant obstructive sleep apnea occurred during this study (AHI = 0.0/h). - The patient had minimal or no oxygen desaturation during the study (Min O2 = 95.0%) - The patient snored with moderate snoring volume. - EKG findings include PVCs. - Clinically significant periodic limb movements did not occur during sleep. No significant associated arousals. - Despite bedtime medications listed above, patient had significant difficulty initiating and maintaining sleep. Total sleep time 158.5 minutes, sleep efficiency 36.4%.  DIAGNOSIS - Primary Snoring - Insomnia  RECOMMENDATIONS - Manage for snoring and insomnia based on clinical judgment. - Sleep hygiene should be reviewed to assess factors that may improve sleep quality. - Weight management and regular exercise should be initiated or continued if appropriate.  [Electronically signed] 01/06/2023 12:49 PM  Baird Lyons MD, [place], American Board of Sleep Medicine NPI: [id number]                      [place], [facility]  ELECTRONICALLY SIGNED ON:  01/06/2023, 12:45 PM [place] PH: [phone]   FX: [phone] [place]

## 2023-02-12 ENCOUNTER — Ambulatory Visit (INDEPENDENT_AMBULATORY_CARE_PROVIDER_SITE_OTHER): Payer: Medicare HMO | Admitting: Behavioral Health

## 2023-02-12 ENCOUNTER — Encounter: Payer: Self-pay | Admitting: Behavioral Health

## 2023-02-12 DIAGNOSIS — F5105 Insomnia due to other mental disorder: Secondary | ICD-10-CM

## 2023-02-12 DIAGNOSIS — F99 Mental disorder, not otherwise specified: Secondary | ICD-10-CM

## 2023-02-12 MED ORDER — BELSOMRA 15 MG PO TABS: ORAL_TABLET | ORAL | 1 refills | Status: DC

## 2023-02-12 NOTE — Progress Notes (Signed)
Cassie Harrell [id number] December 30, 1950 72 y.o.  Virtual Visit via Telephone Note  I connected with pt on 02/12/23 at 11:00 AM EDT by telephone and verified that I am speaking with the correct person using two identifiers.   I discussed the limitations, risks, security and privacy concerns of performing an evaluation and management service by telephone and the availability of in person appointments. I also discussed with the patient that there may be a patient responsible charge related to this service. The patient expressed understanding and agreed to proceed.   I discussed the assessment and treatment plan with the patient. The patient was provided an opportunity to ask questions and all were answered. The patient agreed with the plan and demonstrated an understanding of the instructions.   The patient was advised to call back or seek an in-person evaluation if the symptoms worsen or if the condition fails to improve as anticipated.  I provided 30 minutes of non-face-to-face time during this encounter.  The patient was located at home.  The provider was located at [facility].   Cassie Flores, NP   Subjective:   Patient ID:  Cassie Harrell is a 72 y.o. (DOB 1951-08-02) female.  Chief Complaint:  Chief Complaint  Patient presents with   Anxiety   Depression   Manic Behavior   Patient Education   Follow-up   Medication Problem    HPI "Cassie Harrell", 72 -year-old African-American female presents via telphonic interview for follow up and medication management.  Says that her sleep has continued to be all over the place. Sleeps a few nights well and then has a couple night of no sleep. She would like to stop Seroquel due to the weight  gain.  Requesting to trying another sleep med.  She reports anxiety as 6/10, and depression as 4/10.  Been experiencing some impulse control problems but feels it is from lack of sleep  Sleeping right now about 6-7 hours per night.   She is currently in a relationship.  She is retired with disability.  She denies any recent mania, no psychosis, no auditory or visual hallucinations.  No SI or HI.  She states that she feels safe with no additional risk.       Review of Systems:  Review of Systems  Allergic/Immunologic: Negative.   Neurological: Negative.   Psychiatric/Behavioral:  Positive for dysphoric mood and sleep disturbance. The patient is nervous/anxious and is hyperactive.     Medications: I have reviewed the patient's current medications.  Current Outpatient Medications  Medication Sig Dispense Refill   Suvorexant (BELSOMRA) 15 MG TABS Take one tablet by mouth at bedtime as needed for sleep 30 tablet 1   albuterol (PROVENTIL HFA;VENTOLIN HFA) 108 (90 BASE) MCG/ACT inhaler Inhale 2 puffs into the lungs every 6 (six) hours as needed for wheezing or shortness of breath.      amLODipine (NORVASC) 5 MG tablet Take 5 mg by mouth daily.     brimonidine-timolol (COMBIGAN) 0.2-0.5 % ophthalmic solution Place 1 drop into both eyes every 12 (twelve) hours.      estradiol (ESTRACE) 0.1 MG/GM vaginal cream Place vaginally.     famotidine (PEPCID) 20 MG tablet Take 1 tablet (20 mg total) by mouth 2 (two) times daily. 30 tablet 0   furosemide (LASIX) 20 MG tablet Take 20 mg by mouth 2 (two) times daily.     ibandronate (BONIVA) 150 MG tablet Take 150 mg by mouth every 30 (thirty) days.  ibuprofen (ADVIL,MOTRIN) 800 MG tablet Take 1 tablet (800 mg total) by mouth 3 (three) times daily. (Patient taking differently: Take 800 mg by mouth as needed.) 30 tablet 0   latanoprost (XALATAN) 0.005 % ophthalmic solution Place 1 drop into both eyes at bedtime.      LATUDA 40 MG TABS tablet Take 1 tablet (40 mg total) by mouth at bedtime. 30 tablet 3   linaclotide (LINZESS) 145 MCG CAPS capsule Take 145 mcg by mouth daily as needed (ibs/ constipation).      lisinopril (ZESTRIL) 10 MG tablet Take 10 mg by mouth daily.     magnesium  oxide (MAG-OX) 400 (240 Mg) MG tablet Take 1 tablet by mouth daily.     metoprolol succinate (TOPROL-XL) 25 MG 24 hr tablet TAKE 1 TABLET BY MOUTH EVERY DAY 90 tablet 3   Omega-3 Fatty Acids (FISH OIL) 1200 MG CAPS Take 1,200 mg by mouth daily.      pantoprazole (PROTONIX) 40 MG tablet Take 40 mg by mouth 2 (two) times daily.     QUEtiapine (SEROQUEL) 100 MG tablet Take 1.5 tablets (150 mg total) by mouth at bedtime. 135 tablet 1   traZODone (DESYREL) 100 MG tablet Take 1.5 tablets (150 mg total) by mouth at bedtime. 135 tablet 1   umeclidinium bromide (INCRUSE ELLIPTA) 62.5 MCG/INH AEPB Inhale 1 puff into the lungs daily.     Vitamin D, Ergocalciferol, (DRISDOL) 50000 units CAPS capsule Take 50,000 Units by mouth every Saturday.   5   No current facility-administered medications for this visit.    Medication Side Effects: None  Allergies:  Allergies  Allergen Reactions   Codeine Hives and Nausea And Vomiting   Diclofenac Palpitations and Other (See Comments)    Other pains   Flagyl [Metronidazole] Other (See Comments)    Crazy    Imipramine Rash   Meloxicam Itching   Metoprolol Tartrate Itching and Rash   Olanzapine Other (See Comments)    Patient preference    Other Rash    Oil of olay moisturizing cream and eye cream caused measles like rash   Vistaril [Hydroxyzine Hcl] Itching and Rash    Past Medical History:  Diagnosis Date   Alcohol abuse with alcohol-induced mood disorder (HCC) 07/31/2016   Anxiety    Bipolar 1 disorder (HCC)    Cocaine abuse (HCC)    Constipation    COPD (chronic obstructive pulmonary disease) (HCC)    Dissociative disorder    Dysrhythmia    irregular heartrate   GERD (gastroesophageal reflux disease)    Glaucoma    Hypertension    Neuropathy of foot    bilateral   Osteoarthritis    osteoarthritis   PTSD (post-traumatic stress disorder)    PVD (peripheral vascular disease) (HCC) 2017   Sleep apnea    has had surgery    Family History   Problem Relation Age of Onset   Depression Mother    Anxiety disorder Mother    Other Mother        natural   Alcoholism Father    Alcohol abuse Father    Heart attack Father    Other Sister        kidney transplant   Goiter Sister    Diabetes Sister    Alcohol abuse Brother    Alcoholism Brother    Diabetes Brother     Social History   Socioeconomic History   Marital status: Single    Spouse name: Not on file  Number of children: 1   Years of education: 14   Highest education level: Associate degree: occupational, Scientist, product/process development, or vocational program  Occupational History    Employer: OTHER    Comment: disabled   Occupation: Retired  Tobacco Use   Smoking status: Every Day    Packs/day: 0.25    Years: 20.00    Additional pack years: 0.00    Total pack years: 5.00    Types: Cigarettes   Smokeless tobacco: Never  Vaping Use   Vaping Use: Never used  Substance and Sexual Activity   Alcohol use: Not Currently   Drug use: Yes    Types: Cocaine    Comment: none since 2015/16   Sexual activity: Yes  Other Topics Concern   Not on file  Social History Narrative   Patient lives at home alone.   Caffeine Use: quit 2 yrs ago   Social Determinants of Corporate investment banker Strain: Not on file  Food Insecurity: Not on file  Transportation Needs: Not on file  Physical Activity: Not on file  Stress: Not on file  Social Connections: Not on file  Intimate Partner Violence: Not on file    Past Medical History, Surgical history, Social history, and Family history were reviewed and updated as appropriate.   Please see review of systems for further details on the patient's review from today.   Objective:   Physical Exam:  There were no vitals taken for this visit.  Physical Exam Neurological:     Mental Status: She is alert and oriented to person, place, and time.  Psychiatric:        Attention and Perception: Attention and perception normal.        Mood and  Affect: Mood normal.        Speech: Speech normal.        Behavior: Behavior normal. Behavior is cooperative.        Cognition and Memory: Cognition and memory normal.        Judgment: Judgment normal.     Comments: Insight intact     Lab Review:     Component Value Date/Time   NA 139 08/03/2022 1342   K 4.0 08/03/2022 1342   CL 107 08/03/2022 1342   CO2 22 08/03/2022 1342   GLUCOSE 171 (H) 08/03/2022 1342   BUN 7 (L) 08/03/2022 1342   CREATININE 1.09 (H) 08/03/2022 1342   CREATININE 1.02 10/26/2021 0256   CALCIUM 8.7 (L) 08/03/2022 1342   PROT 6.1 06/16/2021 0000   ALBUMIN 3.9 09/13/2019 2126   AST 17 06/16/2021 0000   ALT 8 06/16/2021 0000   ALKPHOS 54 09/13/2019 2126   BILITOT 0.5 06/16/2021 0000   GFRNONAA 55 (L) 08/03/2022 1342   GFRAA >60 09/13/2019 2126       Component Value Date/Time   WBC 6.5 08/03/2022 1342   RBC 3.81 (L) 08/03/2022 1342   HGB 12.1 08/03/2022 1342   HCT 38.3 08/03/2022 1342   PLT 305 08/03/2022 1342   MCV 100.5 (H) 08/03/2022 1342   MCH 31.8 08/03/2022 1342   MCHC 31.6 08/03/2022 1342   RDW 14.5 08/03/2022 1342   LYMPHSABS 2.6 08/03/2022 1342   MONOABS 0.5 08/03/2022 1342   EOSABS 0.2 08/03/2022 1342   BASOSABS 0.1 08/03/2022 1342    No results found for: "POCLITH", "LITHIUM"   No results found for: "PHENYTOIN", "PHENOBARB", "VALPROATE", "CBMZ"   .res Assessment: Plan:    Recommendations   Greater than 50% of  30 min telephonic interview with patient was spent on counseling and coordination of care.  We discussed her current level of reported stability. She is exhibiting problems with impulse control this visit. She is flat and feels like Latuda and Seroquel is not working. We reviewed her medications and discussed various sleeping medications.   We agreed to: To continue trazodone to 150 mg daily at bedtime To continue Seroquel to 150 mg at bedtime Will start Belsomra 15 mg take at bedtime  To continue Klonopin 0.5 mg 1-2  tablets daily as needed for severe anxiety Continue Latuda 40 mg daily. Take with 350 calorie meal or snack. To report side effects or worsening symptoms promptly To follow up in 12 weeks to reasess Provided emergency contact information Reviewed PDMP   Discussed potential metabolic side effects associated with atypical antipsychotics, as well as potential risk for movement side effects. Advised pt to contact office if movement side effects occur.          Hazell was seen today for anxiety, depression, manic behavior, patient education, follow-up and medication problem.  Diagnoses and all orders for this visit:  Insomnia due to other mental disorder -     Suvorexant (BELSOMRA) 15 MG TABS; Take one tablet by mouth at bedtime as needed for sleep    Please see After Visit Summary for patient specific instructions.  No future appointments.  No orders of the defined types were placed in this encounter.     -------------------------------

## 2023-02-14 ENCOUNTER — Telehealth: Payer: Self-pay | Admitting: Behavioral Health

## 2023-02-14 NOTE — Telephone Encounter (Signed)
Pt called reporting Belsomra Rx need PA. [facility] [address] Pt contact [phone]

## 2023-02-14 NOTE — Telephone Encounter (Signed)
There is nothing in CMM.

## 2023-02-16 NOTE — Telephone Encounter (Signed)
Patient notified

## 2023-02-16 NOTE — Telephone Encounter (Signed)
Prior Authorization was initiated and submitted with [employer] for Belsomra 15 mg tablets #30/30 through [facility]. It was submitted urgent but not sure they will accept it or not.

## 2023-02-16 NOTE — Telephone Encounter (Signed)
Prior Approval received Belsomra 15 mg 10/16/2022-10/16/2023 with [employer]

## 2023-02-16 NOTE — Telephone Encounter (Signed)
I did initiate a PA for Belsomra 15 mg, still pending response

## 2023-03-14 ENCOUNTER — Encounter: Payer: Self-pay | Admitting: Behavioral Health

## 2023-03-14 ENCOUNTER — Ambulatory Visit (INDEPENDENT_AMBULATORY_CARE_PROVIDER_SITE_OTHER): Payer: Medicare HMO | Admitting: Behavioral Health

## 2023-03-14 DIAGNOSIS — F319 Bipolar disorder, unspecified: Secondary | ICD-10-CM

## 2023-03-14 DIAGNOSIS — F411 Generalized anxiety disorder: Secondary | ICD-10-CM | POA: Diagnosis not present

## 2023-03-14 DIAGNOSIS — F5105 Insomnia due to other mental disorder: Secondary | ICD-10-CM | POA: Diagnosis not present

## 2023-03-14 DIAGNOSIS — F99 Mental disorder, not otherwise specified: Secondary | ICD-10-CM

## 2023-03-14 NOTE — Progress Notes (Signed)
Cassie Harrell [id number] 07/16/1951 72 y.o.  Virtual Visit via Telephone Note  I connected with pt on 03/14/23 at 11:00 AM EDT by telephone and verified that I am speaking with the correct person using two identifiers.   I discussed the limitations, risks, security and privacy concerns of performing an evaluation and management service by telephone and the availability of in person appointments. I also discussed with the patient that there may be a patient responsible charge related to this service. The patient expressed understanding and agreed to proceed.   I discussed the assessment and treatment plan with the patient. The patient was provided an opportunity to ask questions and all were answered. The patient agreed with the plan and demonstrated an understanding of the instructions.   The patient was advised to call back or seek an in-person evaluation if the symptoms worsen or if the condition fails to improve as anticipated.  I provided 30 minutes of non-face-to-face time during this encounter.  The patient was located at home.  The provider was located at [facility].   Cassie Flores, NP   Subjective:   Patient ID:  Cassie Harrell is a 71 y.o. (DOB 07/28/1951) female.  Chief Complaint:  Chief Complaint  Patient presents with   Anxiety   Depression   Medication Problem   Patient Education   Follow-up   Medication Refill   Insomnia    HPI "Cassie Harrell", 26 -year-old African-American female presents via telphonic interview for follow up and medication management.  Says that sleep has improved. She has not been using the Belsomra recently.  She will continue to take Seroquel at bedtime.   She reports anxiety as 4/10, and depression as 3/10.  Sleeping right now about 6-7 hours per night.  She is currently in a relationship.  She is retired with disability.  She denies any recent mania, no psychosis, no auditory or visual hallucinations.  No SI or HI.  She  states that she feels safe with no additional risk.    Review of Systems:  Review of Systems  Constitutional: Negative.   Allergic/Immunologic: Negative.   Neurological: Negative.   Psychiatric/Behavioral:  Positive for dysphoric mood and sleep disturbance. The patient is nervous/anxious.     Medications: I have reviewed the patient's current medications.  Current Outpatient Medications  Medication Sig Dispense Refill   albuterol (PROVENTIL HFA;VENTOLIN HFA) 108 (90 BASE) MCG/ACT inhaler Inhale 2 puffs into the lungs every 6 (six) hours as needed for wheezing or shortness of breath.      amLODipine (NORVASC) 5 MG tablet Take 5 mg by mouth daily.     brimonidine-timolol (COMBIGAN) 0.2-0.5 % ophthalmic solution Place 1 drop into both eyes every 12 (twelve) hours.      estradiol (ESTRACE) 0.1 MG/GM vaginal cream Place vaginally.     famotidine (PEPCID) 20 MG tablet Take 1 tablet (20 mg total) by mouth 2 (two) times daily. 30 tablet 0   furosemide (LASIX) 20 MG tablet Take 20 mg by mouth 2 (two) times daily.     ibandronate (BONIVA) 150 MG tablet Take 150 mg by mouth every 30 (thirty) days.     ibuprofen (ADVIL,MOTRIN) 800 MG tablet Take 1 tablet (800 mg total) by mouth 3 (three) times daily. (Patient taking differently: Take 800 mg by mouth as needed.) 30 tablet 0   latanoprost (XALATAN) 0.005 % ophthalmic solution Place 1 drop into both eyes at bedtime.      LATUDA 40 MG TABS tablet Take  1 tablet (40 mg total) by mouth at bedtime. 30 tablet 3   linaclotide (LINZESS) 145 MCG CAPS capsule Take 145 mcg by mouth daily as needed (ibs/ constipation).      lisinopril (ZESTRIL) 10 MG tablet Take 10 mg by mouth daily.     magnesium oxide (MAG-OX) 400 (240 Mg) MG tablet Take 1 tablet by mouth daily.     metoprolol succinate (TOPROL-XL) 25 MG 24 hr tablet TAKE 1 TABLET BY MOUTH EVERY DAY 90 tablet 3   Omega-3 Fatty Acids (FISH OIL) 1200 MG CAPS Take 1,200 mg by mouth daily.      pantoprazole  (PROTONIX) 40 MG tablet Take 40 mg by mouth 2 (two) times daily.     QUEtiapine (SEROQUEL) 100 MG tablet Take 1.5 tablets (150 mg total) by mouth at bedtime. 135 tablet 1   Suvorexant (BELSOMRA) 15 MG TABS Take one tablet by mouth at bedtime as needed for sleep 30 tablet 1   traZODone (DESYREL) 100 MG tablet Take 1.5 tablets (150 mg total) by mouth at bedtime. 135 tablet 1   umeclidinium bromide (INCRUSE ELLIPTA) 62.5 MCG/INH AEPB Inhale 1 puff into the lungs daily.     Vitamin D, Ergocalciferol, (DRISDOL) 50000 units CAPS capsule Take 50,000 Units by mouth every Saturday.   5   No current facility-administered medications for this visit.    Medication Side Effects: None  Allergies:  Allergies  Allergen Reactions   Codeine Hives and Nausea And Vomiting   Diclofenac Palpitations and Other (See Comments)    Other pains   Flagyl [Metronidazole] Other (See Comments)    Crazy    Imipramine Rash   Meloxicam Itching   Metoprolol Tartrate Itching and Rash   Olanzapine Other (See Comments)    Patient preference    Other Rash    Oil of olay moisturizing cream and eye cream caused measles like rash   Vistaril [Hydroxyzine Hcl] Itching and Rash    Past Medical History:  Diagnosis Date   Alcohol abuse with alcohol-induced mood disorder (HCC) 07/31/2016   Anxiety    Bipolar 1 disorder (HCC)    Cocaine abuse (HCC)    Constipation    COPD (chronic obstructive pulmonary disease) (HCC)    Dissociative disorder    Dysrhythmia    irregular heartrate   GERD (gastroesophageal reflux disease)    Glaucoma    Hypertension    Neuropathy of foot    bilateral   Osteoarthritis    osteoarthritis   PTSD (post-traumatic stress disorder)    PVD (peripheral vascular disease) (HCC) 2017   Sleep apnea    has had surgery    Family History  Problem Relation Age of Onset   Depression Mother    Anxiety disorder Mother    Other Mother        natural   Alcoholism Father    Alcohol abuse Father     Heart attack Father    Other Sister        kidney transplant   Goiter Sister    Diabetes Sister    Alcohol abuse Brother    Alcoholism Brother    Diabetes Brother     Social History   Socioeconomic History   Marital status: Single    Spouse name: Not on file   Number of children: 1   Years of education: 14   Highest education level: Associate degree: occupational, Scientist, product/process development, or vocational program  Occupational History    Employer: OTHER    Comment:  disabled   Occupation: Retired  Tobacco Use   Smoking status: Every Day    Packs/day: 0.25    Years: 20.00    Additional pack years: 0.00    Total pack years: 5.00    Types: Cigarettes   Smokeless tobacco: Never  Vaping Use   Vaping Use: Never used  Substance and Sexual Activity   Alcohol use: Not Currently   Drug use: Yes    Types: Cocaine    Comment: none since 2015/16   Sexual activity: Yes  Other Topics Concern   Not on file  Social History Narrative   Patient lives at home alone.   Caffeine Use: quit 2 yrs ago   Social Determinants of Corporate investment banker Strain: Not on file  Food Insecurity: Not on file  Transportation Needs: Not on file  Physical Activity: Not on file  Stress: Not on file  Social Connections: Not on file  Intimate Partner Violence: Not on file    Past Medical History, Surgical history, Social history, and Family history were reviewed and updated as appropriate.   Please see review of systems for further details on the patient's review from today.   Objective:   Physical Exam:  There were no vitals taken for this visit.  Physical Exam Neurological:     Mental Status: She is alert and oriented to person, place, and time.  Psychiatric:        Attention and Perception: Attention and perception normal.        Mood and Affect: Mood normal.        Speech: Speech normal.        Behavior: Behavior normal. Behavior is cooperative.        Cognition and Memory: Cognition and memory  normal.        Judgment: Judgment normal.     Comments: Insight intact     Lab Review:     Component Value Date/Time   NA 139 08/03/2022 1342   K 4.0 08/03/2022 1342   CL 107 08/03/2022 1342   CO2 22 08/03/2022 1342   GLUCOSE 171 (H) 08/03/2022 1342   BUN 7 (L) 08/03/2022 1342   CREATININE 1.09 (H) 08/03/2022 1342   CREATININE 1.02 10/26/2021 0256   CALCIUM 8.7 (L) 08/03/2022 1342   PROT 6.1 06/16/2021 0000   ALBUMIN 3.9 09/13/2019 2126   AST 17 06/16/2021 0000   ALT 8 06/16/2021 0000   ALKPHOS 54 09/13/2019 2126   BILITOT 0.5 06/16/2021 0000   GFRNONAA 55 (L) 08/03/2022 1342   GFRAA >60 09/13/2019 2126       Component Value Date/Time   WBC 6.5 08/03/2022 1342   RBC 3.81 (L) 08/03/2022 1342   HGB 12.1 08/03/2022 1342   HCT 38.3 08/03/2022 1342   PLT 305 08/03/2022 1342   MCV 100.5 (H) 08/03/2022 1342   MCH 31.8 08/03/2022 1342   MCHC 31.6 08/03/2022 1342   RDW 14.5 08/03/2022 1342   LYMPHSABS 2.6 08/03/2022 1342   MONOABS 0.5 08/03/2022 1342   EOSABS 0.2 08/03/2022 1342   BASOSABS 0.1 08/03/2022 1342    No results found for: "POCLITH", "LITHIUM"   No results found for: "PHENYTOIN", "PHENOBARB", "VALPROATE", "CBMZ"   .res Assessment: Plan:   Recommendations   Greater than 50% of  30 min telephonic interview with patient was spent on counseling and coordination of care.  We discussed her current level of reported stability. Sleep has improved. She has questions about her Belsomra. We reviewed her  medications and discussed various sleeping medications.   We agreed to: To continue trazodone to 150 mg daily at bedtime To continue Seroquel to 150 mg at bedtime To continue Belsomra 15 mg take at bedtime  To continue Klonopin 0.5 mg 1-2 tablets daily as needed for severe anxiety Continue Latuda 40 mg daily. Take with 350 calorie meal or snack. To report side effects or worsening symptoms promptly To follow up in 12 weeks to reasess Provided emergency contact  information Reviewed PDMP   Discussed potential metabolic side effects associated with atypical antipsychotics, as well as potential risk for movement side effects. Advised pt to contact office if movement side effects occur.            There are no diagnoses linked to this encounter.  Please see After Visit Summary for patient specific instructions.  Future Appointments  Date Time Provider Department Center  06/14/2023 11:00 AM Avelina Laine A, NP [facility] None    No orders of the defined types were placed in this encounter.     -------------------------------

## 2023-03-27 ENCOUNTER — Other Ambulatory Visit: Payer: Self-pay | Admitting: Internal Medicine

## 2023-03-28 LAB — COMPLETE METABOLIC PANEL WITH GFR
AG Ratio: 1.8 (calc) (ref 1.0–2.5)
ALT: 11 U/L (ref 6–29)
AST: 13 U/L (ref 10–35)
Albumin: 4.1 g/dL (ref 3.6–5.1)
Alkaline phosphatase (APISO): 47 U/L (ref 37–153)
BUN: 14 mg/dL (ref 7–25)
CO2: 20 mmol/L (ref 20–32)
Calcium: 8.8 mg/dL (ref 8.6–10.4)
Chloride: 108 mmol/L (ref 98–110)
Creat: 0.98 mg/dL (ref 0.60–1.00)
Globulin: 2.3 g/dL (calc) (ref 1.9–3.7)
Glucose, Bld: 143 mg/dL — ABNORMAL HIGH (ref 65–99)
Potassium: 4 mmol/L (ref 3.5–5.3)
Sodium: 138 mmol/L (ref 135–146)
Total Bilirubin: 0.3 mg/dL (ref 0.2–1.2)
Total Protein: 6.4 g/dL (ref 6.1–8.1)
eGFR: 62 mL/min/{1.73_m2} (ref >=60)

## 2023-03-28 LAB — LIPID PANEL
Cholesterol: 129 mg/dL (ref <200)
HDL: 55 mg/dL (ref >=50)
LDL Cholesterol (Calc): 51 mg/dL (calc)
Non-HDL Cholesterol (Calc): 74 mg/dL (calc) (ref <130)
Total CHOL/HDL Ratio: 2.3 (calc) (ref <5.0)
Triglycerides: 143 mg/dL (ref <150)

## 2023-03-28 LAB — VITAMIN D 25 HYDROXY (VIT D DEFICIENCY, FRACTURES): Vit D, 25-Hydroxy: 56 ng/mL (ref 30–100)

## 2023-03-28 LAB — CBC
HCT: 38.2 % (ref 35.0–45.0)
Hemoglobin: 12.5 g/dL (ref 11.7–15.5)
MCH: 32.1 pg (ref 27.0–33.0)
MCHC: 32.7 g/dL (ref 32.0–36.0)
MCV: 97.9 fL (ref 80.0–100.0)
MPV: 10.1 fL (ref 7.5–12.5)
Platelets: 289 10*3/uL (ref 140–400)
RBC: 3.9 10*6/uL (ref 3.80–5.10)
RDW: 13.2 % (ref 11.0–15.0)
WBC: 4.9 10*3/uL (ref 3.8–10.8)

## 2023-03-28 LAB — TSH: TSH: 1.61 mIU/L (ref 0.40–4.50)

## 2023-03-30 ENCOUNTER — Other Ambulatory Visit: Payer: Self-pay | Admitting: Behavioral Health

## 2023-03-30 DIAGNOSIS — F319 Bipolar disorder, unspecified: Secondary | ICD-10-CM

## 2023-04-02 ENCOUNTER — Telehealth: Payer: Self-pay | Admitting: Behavioral Health

## 2023-04-02 NOTE — Telephone Encounter (Signed)
Pharmacy sent PA Request for Latuda , See [facility]

## 2023-04-05 NOTE — Telephone Encounter (Signed)
No PA needed with [employer]

## 2023-04-06 ENCOUNTER — Other Ambulatory Visit: Payer: Self-pay | Admitting: Internal Medicine

## 2023-04-06 DIAGNOSIS — N644 Mastodynia: Secondary | ICD-10-CM

## 2023-04-18 ENCOUNTER — Ambulatory Visit: Payer: Medicare HMO

## 2023-04-18 ENCOUNTER — Ambulatory Visit
Admission: RE | Admit: 2023-04-18 | Discharge: 2023-04-18 | Disposition: A | Discharge status: Home / Self Care | Payer: Medicare HMO | Source: Ambulatory Visit | Attending: Internal Medicine | Admitting: Internal Medicine

## 2023-04-18 DIAGNOSIS — N644 Mastodynia: Secondary | ICD-10-CM

## 2023-05-17 ENCOUNTER — Other Ambulatory Visit: Payer: Self-pay | Admitting: Behavioral Health

## 2023-05-17 DIAGNOSIS — F5105 Insomnia due to other mental disorder: Secondary | ICD-10-CM

## 2023-05-18 ENCOUNTER — Telehealth: Payer: Self-pay | Admitting: Behavioral Health

## 2023-05-18 ENCOUNTER — Other Ambulatory Visit: Payer: Self-pay

## 2023-05-18 DIAGNOSIS — F5105 Insomnia due to other mental disorder: Secondary | ICD-10-CM

## 2023-05-18 MED ORDER — BELSOMRA 15 MG PO TABS: ORAL_TABLET | ORAL | 1 refills | Status: DC

## 2023-05-18 NOTE — Telephone Encounter (Signed)
Pended.

## 2023-05-18 NOTE — Telephone Encounter (Signed)
Next appt is 06/14/23. Requesting refill on Belsomra. She has two left. Pharmacy is:  [id number] G[place] Otto, [place] - [address] AT [address] [facility] [address]   Phone: [phone]  Fax: [phone]

## 2023-06-14 ENCOUNTER — Encounter: Payer: Self-pay | Admitting: Behavioral Health

## 2023-06-14 ENCOUNTER — Ambulatory Visit (INDEPENDENT_AMBULATORY_CARE_PROVIDER_SITE_OTHER): Payer: Medicare HMO | Admitting: Behavioral Health

## 2023-06-14 DIAGNOSIS — F411 Generalized anxiety disorder: Secondary | ICD-10-CM

## 2023-06-14 DIAGNOSIS — F99 Mental disorder, not otherwise specified: Secondary | ICD-10-CM | POA: Diagnosis not present

## 2023-06-14 DIAGNOSIS — F319 Bipolar disorder, unspecified: Secondary | ICD-10-CM

## 2023-06-14 DIAGNOSIS — F5105 Insomnia due to other mental disorder: Secondary | ICD-10-CM

## 2023-06-14 MED ORDER — QUETIAPINE FUMARATE 100 MG PO TABS: 100.0000 mg | ORAL_TABLET | Freq: Every day | ORAL | 1 refills | Status: DC

## 2023-06-14 MED ORDER — TRAZODONE HCL 50 MG PO TABS: ORAL_TABLET | ORAL | 3 refills | Status: DC

## 2023-06-14 NOTE — Progress Notes (Signed)
Cassie Harrell [id number] Dec 12, 1950 72 y.o.  Virtual Visit via Telephone Note  I connected with pt on 06/14/23 at 11:00 AM EDT by telephone and verified that I am speaking with the correct person using two identifiers.   I discussed the limitations, risks, security and privacy concerns of performing an evaluation and management service by telephone and the availability of in person appointments. I also discussed with the patient that there may be a patient responsible charge related to this service. The patient expressed understanding and agreed to proceed.   I discussed the assessment and treatment plan with the patient. The patient was provided an opportunity to ask questions and all were answered. The patient agreed with the plan and demonstrated an understanding of the instructions.   The patient was advised to call back or seek an in-person evaluation if the symptoms worsen or if the condition fails to improve as anticipated.  I provided 30 minutes of non-face-to-face time during this encounter.  The patient was located at home.  The provider was located at [facility].   Joan Flores, NP   Subjective:   Patient ID:  Cassie Harrell is a 72 y.o. (DOB [birth year]) female.  Chief Complaint:  Chief Complaint  Patient presents with   Anxiety   Depression   Follow-up   Patient Education   Medication Refill    HPI "Cassie Harrell", 47 -year-old African-American female presents via telphonic interview for follow up and medication management.  Says that sleep has improved. She has not been using the Belsomra recently because was unavailable at [facility] and had to be ordered. She expresses that she would like to reduce her Seroquel and Trazodone at bedtime.   She reports anxiety as 3/10, and depression as 3/10.  Sleeping right now about 6-7 hours per night.  She is currently in a relationship.  She is retired with disability.  She denies any recent mania, no psychosis,  no auditory or visual hallucinations.  No SI or HI.  She states that she feels safe with no additional risk.        Review of Systems:  Review of Systems  Constitutional: Negative.   Allergic/Immunologic: Negative.     Medications: I have reviewed the patient's current medications.  Current Outpatient Medications  Medication Sig Dispense Refill   QUEtiapine (SEROQUEL) 100 MG tablet Take 1 tablet (100 mg total) by mouth at bedtime. 90 tablet 1   traZODone (DESYREL) 50 MG tablet Take 1-2 tablets by mouth at bedtime daily as needed for sleep. 60 tablet 3   albuterol (PROVENTIL HFA;VENTOLIN HFA) 108 (90 BASE) MCG/ACT inhaler Inhale 2 puffs into the lungs every 6 (six) hours as needed for wheezing or shortness of breath.      amLODipine (NORVASC) 5 MG tablet Take 5 mg by mouth daily.     brimonidine-timolol (COMBIGAN) 0.2-0.5 % ophthalmic solution Place 1 drop into both eyes every 12 (twelve) hours.      estradiol (ESTRACE) 0.1 MG/GM vaginal cream Place vaginally.     famotidine (PEPCID) 20 MG tablet Take 1 tablet (20 mg total) by mouth 2 (two) times daily. 30 tablet 0   furosemide (LASIX) 20 MG tablet Take 20 mg by mouth 2 (two) times daily.     ibandronate (BONIVA) 150 MG tablet Take 150 mg by mouth every 30 (thirty) days.     ibuprofen (ADVIL,MOTRIN) 800 MG tablet Take 1 tablet (800 mg total) by mouth 3 (three) times daily. (Patient taking differently: Take 800  mg by mouth as needed.) 30 tablet 0   latanoprost (XALATAN) 0.005 % ophthalmic solution Place 1 drop into both eyes at bedtime.      LATUDA 40 MG TABS tablet TAKE 1 TABLET(40 MG) BY MOUTH AT BEDTIME 30 tablet 3   linaclotide (LINZESS) 145 MCG CAPS capsule Take 145 mcg by mouth daily as needed (ibs/ constipation).      lisinopril (ZESTRIL) 10 MG tablet Take 10 mg by mouth daily.     magnesium oxide (MAG-OX) 400 (240 Mg) MG tablet Take 1 tablet by mouth daily.     metoprolol succinate (TOPROL-XL) 25 MG 24 hr tablet TAKE 1 TABLET BY  MOUTH EVERY DAY 90 tablet 3   Omega-3 Fatty Acids (FISH OIL) 1200 MG CAPS Take 1,200 mg by mouth daily.      pantoprazole (PROTONIX) 40 MG tablet Take 40 mg by mouth 2 (two) times daily.     QUEtiapine (SEROQUEL) 100 MG tablet Take 1.5 tablets (150 mg total) by mouth at bedtime. 135 tablet 1   Suvorexant (BELSOMRA) 15 MG TABS Take one tablet by mouth at bedtime as needed for sleep 30 tablet 1   traZODone (DESYREL) 100 MG tablet Take 1.5 tablets (150 mg total) by mouth at bedtime. 135 tablet 1   umeclidinium bromide (INCRUSE ELLIPTA) 62.5 MCG/INH AEPB Inhale 1 puff into the lungs daily.     Vitamin D, Ergocalciferol, (DRISDOL) 50000 units CAPS capsule Take 50,000 Units by mouth every Saturday.   5   No current facility-administered medications for this visit.    Medication Side Effects: None  Allergies:  Allergies  Allergen Reactions   Codeine Hives and Nausea And Vomiting   Diclofenac Palpitations and Other (See Comments)    Other pains   Flagyl [Metronidazole] Other (See Comments)    Crazy    Imipramine Rash   Meloxicam Itching   Metoprolol Tartrate Itching and Rash   Olanzapine Other (See Comments)    Patient preference    Other Rash    Oil of olay moisturizing cream and eye cream caused measles like rash   Vistaril [Hydroxyzine Hcl] Itching and Rash    Past Medical History:  Diagnosis Date   Alcohol abuse with alcohol-induced mood disorder (HCC) 07/31/2016   Anxiety    Bipolar 1 disorder (HCC)    Cocaine abuse (HCC)    Constipation    COPD (chronic obstructive pulmonary disease) (HCC)    Dissociative disorder    Dysrhythmia    irregular heartrate   GERD (gastroesophageal reflux disease)    Glaucoma    Hypertension    Neuropathy of foot    bilateral   Osteoarthritis    osteoarthritis   PTSD (post-traumatic stress disorder)    PVD (peripheral vascular disease) (HCC) 2017   Sleep apnea    has had surgery    Family History  Problem Relation Age of Onset    Depression Mother    Anxiety disorder Mother    Other Mother        natural   Alcoholism Father    Alcohol abuse Father    Heart attack Father    Other Sister        kidney transplant   Goiter Sister    Diabetes Sister    Alcohol abuse Brother    Alcoholism Brother    Diabetes Brother     Social History   Socioeconomic History   Marital status: Single    Spouse name: Not on file   Number  of children: 1   Years of education: 14   Highest education level: Associate degree: occupational, Scientist, product/process development, or vocational program  Occupational History    Employer: OTHER    Comment: disabled   Occupation: Retired  Tobacco Use   Smoking status: Every Day    Current packs/day: 0.25    Average packs/day: 0.3 packs/day for 20.0 years (5.0 ttl pk-yrs)    Types: Cigarettes   Smokeless tobacco: Never  Vaping Use   Vaping status: Never Used  Substance and Sexual Activity   Alcohol use: Not Currently   Drug use: Yes    Types: Cocaine    Comment: none since 2015/16   Sexual activity: Yes  Other Topics Concern   Not on file  Social History Narrative   Patient lives at home alone.   Caffeine Use: quit 2 yrs ago   Social Determinants of Corporate investment banker Strain: Not on file  Food Insecurity: Not on file  Transportation Needs: Not on file  Physical Activity: Not on file  Stress: Not on file  Social Connections: Unknown (02/26/2022)   [facility]   Social Network    Social Network: Not on file  Intimate Partner Violence: Unknown (01/18/2022)   Received from [facility]   HITS    Physically Hurt: Not on file    Insult or Talk Down To: Not on file    Threaten Physical Harm: Not on file    Scream or Curse: Not on file    Past Medical History, Surgical history, Social history, and Family history were reviewed and updated as appropriate.   Please see review of systems for further details on the patient's review from today.   Objective:   Physical Exam:   There were no vitals taken for this visit.  Physical Exam Neurological:     Mental Status: She is alert and oriented to person, place, and time.  Psychiatric:        Attention and Perception: Attention and perception normal.        Mood and Affect: Mood normal.        Speech: Speech normal.        Behavior: Behavior normal. Behavior is cooperative.        Cognition and Memory: Cognition and memory normal.        Judgment: Judgment normal.     Comments: Insight intact     Lab Review:     Component Value Date/Time   NA 138 03/27/2023 1024   K 4.0 03/27/2023 1024   CL 108 03/27/2023 1024   CO2 20 03/27/2023 1024   GLUCOSE 143 (H) 03/27/2023 1024   BUN 14 03/27/2023 1024   CREATININE 0.98 03/27/2023 1024   CALCIUM 8.8 03/27/2023 1024   PROT 6.4 03/27/2023 1024   ALBUMIN 3.9 09/13/2019 2126   AST 13 03/27/2023 1024   ALT 11 03/27/2023 1024   ALKPHOS 54 09/13/2019 2126   BILITOT 0.3 03/27/2023 1024   GFRNONAA 55 (L) 08/03/2022 1342   GFRAA >60 09/13/2019 2126       Component Value Date/Time   WBC 4.9 03/27/2023 1024   RBC 3.90 03/27/2023 1024   HGB 12.5 03/27/2023 1024   HCT 38.2 03/27/2023 1024   PLT 289 03/27/2023 1024   MCV 97.9 03/27/2023 1024   MCH 32.1 03/27/2023 1024   MCHC 32.7 03/27/2023 1024   RDW 13.2 03/27/2023 1024   LYMPHSABS 2.6 08/03/2022 1342   MONOABS 0.5 08/03/2022 1342  EOSABS 0.2 08/03/2022 1342   BASOSABS 0.1 08/03/2022 1342    No results found for: "POCLITH", "LITHIUM"   No results found for: "PHENYTOIN", "PHENOBARB", "VALPROATE", "CBMZ"   .res Assessment: Plan:     Recommendations   Greater than 50% of  30 min telephonic interview with patient was spent on counseling and coordination of care.  We discussed her current level of reported stability. Sleep has improved and she would like to reduced her sleep medication dosing. She has questions about her Belsomra availability. We reviewed her medications and discussed various sleeping  medications.   We agreed to: To reduce trazodone to 50-100 mg daily at bedtime To reduceSeroquel to 100 mg at bedtime To continue Belsomra 15 mg take at bedtime  To continue Klonopin 0.5 mg 1-2 tablets daily as needed for severe anxiety Continue Latuda 40 mg daily. Take with 350 calorie meal or snack. To report side effects or worsening symptoms promptly To follow up in 12 weeks to reasess Provided emergency contact information Reviewed PDMP   Discussed potential metabolic side effects associated with atypical antipsychotics, as well as potential risk for movement side effects. Advised pt to contact office if movement side effects occur.      Arlys John A. Davidjames Blansett, NP      Cassie Harrell was seen today for anxiety, depression, follow-up, patient education and medication refill.  Diagnoses and all orders for this visit:  Bipolar 1 disorder (HCC) -     QUEtiapine (SEROQUEL) 100 MG tablet; Take 1 tablet (100 mg total) by mouth at bedtime.  Insomnia due to other mental disorder -     QUEtiapine (SEROQUEL) 100 MG tablet; Take 1 tablet (100 mg total) by mouth at bedtime. -     traZODone (DESYREL) 50 MG tablet; Take 1-2 tablets by mouth at bedtime daily as needed for sleep.  Generalized anxiety disorder -     QUEtiapine (SEROQUEL) 100 MG tablet; Take 1 tablet (100 mg total) by mouth at bedtime.    Please see After Visit Summary for patient specific instructions.  Future Appointments  Date Time Provider Department Center  09/11/2023 11:00 AM Avelina Laine A, NP CP-CP None    No orders of the defined types were placed in this encounter.     -------------------------------

## 2023-07-02 ENCOUNTER — Telehealth: Payer: Self-pay | Admitting: Behavioral Health

## 2023-07-02 NOTE — Telephone Encounter (Signed)
Next visit is 09/11/23. Ollen Gross called asking if she is supposed to take 100 mg of Quetiapine and also what dosage of Belsomra? Phone number is [phone].

## 2023-07-02 NOTE — Telephone Encounter (Signed)
Reviewed meds with patient. She is asking for a RF of Belsomra. She last filled 8/31. She said her pharmacy always has to order it and some days she misses a dose because of this. She reports she doesn't drive and has to get transportation to the pharmacy and would prefer to use the same pharmacy.  Will pend the Rx a few days early for the date due to hopefully alleviate this problem.

## 2023-07-11 ENCOUNTER — Other Ambulatory Visit: Payer: Self-pay | Admitting: Behavioral Health

## 2023-07-11 DIAGNOSIS — F5105 Insomnia due to other mental disorder: Secondary | ICD-10-CM

## 2023-07-11 NOTE — Telephone Encounter (Signed)
Dao called wanting to request 3 refills with this Belsomra prescription.  She has an appt 11.26.

## 2023-07-12 NOTE — Telephone Encounter (Signed)
LF 8/31, due 9/28

## 2023-07-13 NOTE — Telephone Encounter (Signed)
Addressed thru pharmacy refill request.

## 2023-07-13 NOTE — Telephone Encounter (Signed)
Cassie Harrell called at 10?56 to check status of her refill of Belsomra.  I told her it was tomorrow for refill and the prescription would be sent in for her to be able to refill it tomorrow.  I did not confirm that she could get 3 refills.

## 2023-08-03 ENCOUNTER — Other Ambulatory Visit: Payer: Self-pay | Admitting: Behavioral Health

## 2023-08-03 DIAGNOSIS — F319 Bipolar disorder, unspecified: Secondary | ICD-10-CM

## 2023-09-07 ENCOUNTER — Other Ambulatory Visit: Payer: Self-pay | Admitting: Behavioral Health

## 2023-09-07 DIAGNOSIS — F319 Bipolar disorder, unspecified: Secondary | ICD-10-CM

## 2023-09-11 ENCOUNTER — Ambulatory Visit: Payer: Medicare HMO | Admitting: Behavioral Health

## 2023-09-11 ENCOUNTER — Encounter: Payer: Self-pay | Admitting: Behavioral Health

## 2023-09-11 DIAGNOSIS — F319 Bipolar disorder, unspecified: Secondary | ICD-10-CM

## 2023-09-11 DIAGNOSIS — F5105 Insomnia due to other mental disorder: Secondary | ICD-10-CM

## 2023-09-11 DIAGNOSIS — F411 Generalized anxiety disorder: Secondary | ICD-10-CM | POA: Diagnosis not present

## 2023-09-11 DIAGNOSIS — F99 Mental disorder, not otherwise specified: Secondary | ICD-10-CM

## 2023-09-11 MED ORDER — QUETIAPINE FUMARATE 100 MG PO TABS: 100.0000 mg | ORAL_TABLET | Freq: Every day | ORAL | 1 refills | Status: DC

## 2023-09-11 MED ORDER — BELSOMRA 15 MG PO TABS: ORAL_TABLET | ORAL | 2 refills | Status: DC

## 2023-09-11 MED ORDER — LURASIDONE HCL 60 MG PO TABS: 30.0000 mg | ORAL_TABLET | Freq: Every day | ORAL | 1 refills | Status: DC

## 2023-09-11 MED ORDER — TRAZODONE HCL 50 MG PO TABS: ORAL_TABLET | ORAL | 3 refills | Status: DC

## 2023-09-11 NOTE — Progress Notes (Signed)
Cassie Harrell [id number] 28-May-1951 72 y.o.  Virtual Visit via Telephone Note  I connected with pt on 09/11/23 at 11:00 AM EST by telephone and verified that I am speaking with the correct person using two identifiers.   I discussed the limitations, risks, security and privacy concerns of performing an evaluation and management service by telephone and the availability of in person appointments. I also discussed with the patient that there may be a patient responsible charge related to this service. The patient expressed understanding and agreed to proceed.   I discussed the assessment and treatment plan with the patient. The patient was provided an opportunity to ask questions and all were answered. The patient agreed with the plan and demonstrated an understanding of the instructions.   The patient was advised to call back or seek an in-person evaluation if the symptoms worsen or if the condition fails to improve as anticipated.  I provided 30 minutes of non-face-to-face time during this encounter.  The patient was located at home.  The provider was located at [facility].   Joan Flores, NP   Subjective:   Patient ID:  Cassie Harrell is a 72 y.o. (DOB 21-May-1951) female.  Chief Complaint:  Chief Complaint  Patient presents with   Anxiety   Depression   Follow-up   Medication Refill   Patient Education   Medication Problem    HPI  "[name]", 84 -year-old African-American female presents via telphonic interview for follow up and medication management.  Says that recently due to the holidays is experiencing increased stress and depression. She is requesting dosage increase or adjustments with her medications. She reports anxiety as 3/10, and depression as 6/10.  Sleeping right now about 6-7 hours per night.  She is currently in a relationship.  She is retired with disability.  She denies any recent mania, no psychosis, no auditory or visual  hallucinations.  No SI or HI.  She states that she feels safe with no additional risk.       Review of Systems:  Review of Systems  Constitutional: Negative.   Allergic/Immunologic: Negative.   Psychiatric/Behavioral:  Positive for dysphoric mood. The patient is nervous/anxious.     Medications: I have reviewed the patient's current medications.  Current Outpatient Medications  Medication Sig Dispense Refill   Lurasidone HCl 60 MG TABS Take 0.5 tablets (30 mg total) by mouth daily after breakfast. 30 tablet 1   albuterol (PROVENTIL HFA;VENTOLIN HFA) 108 (90 BASE) MCG/ACT inhaler Inhale 2 puffs into the lungs every 6 (six) hours as needed for wheezing or shortness of breath.      amLODipine (NORVASC) 5 MG tablet Take 5 mg by mouth daily.     brimonidine-timolol (COMBIGAN) 0.2-0.5 % ophthalmic solution Place 1 drop into both eyes every 12 (twelve) hours.      estradiol (ESTRACE) 0.1 MG/GM vaginal cream Place vaginally.     famotidine (PEPCID) 20 MG tablet Take 1 tablet (20 mg total) by mouth 2 (two) times daily. 30 tablet 0   furosemide (LASIX) 20 MG tablet Take 20 mg by mouth 2 (two) times daily.     ibandronate (BONIVA) 150 MG tablet Take 150 mg by mouth every 30 (thirty) days.     ibuprofen (ADVIL,MOTRIN) 800 MG tablet Take 1 tablet (800 mg total) by mouth 3 (three) times daily. (Patient taking differently: Take 800 mg by mouth as needed.) 30 tablet 0   latanoprost (XALATAN) 0.005 % ophthalmic solution Place 1 drop into both  eyes at bedtime.      linaclotide (LINZESS) 145 MCG CAPS capsule Take 145 mcg by mouth daily as needed (ibs/ constipation).      lisinopril (ZESTRIL) 10 MG tablet Take 10 mg by mouth daily.     magnesium oxide (MAG-OX) 400 (240 Mg) MG tablet Take 1 tablet by mouth daily.     metoprolol succinate (TOPROL-XL) 25 MG 24 hr tablet TAKE 1 TABLET BY MOUTH EVERY DAY 90 tablet 3   Omega-3 Fatty Acids (FISH OIL) 1200 MG CAPS Take 1,200 mg by mouth daily.      pantoprazole  (PROTONIX) 40 MG tablet Take 40 mg by mouth 2 (two) times daily.     QUEtiapine (SEROQUEL) 100 MG tablet Take 1 tablet (100 mg total) by mouth at bedtime. 90 tablet 1   Suvorexant (BELSOMRA) 15 MG TABS TAKE 1 TABLET BY MOUTH AT BEDTIME AS NEEDED FOR SLEEP 30 tablet 2   traZODone (DESYREL) 100 MG tablet Take 1.5 tablets (150 mg total) by mouth at bedtime. 135 tablet 1   traZODone (DESYREL) 50 MG tablet Take 1-2 tablets by mouth at bedtime daily as needed for sleep. 60 tablet 3   umeclidinium bromide (INCRUSE ELLIPTA) 62.5 MCG/INH AEPB Inhale 1 puff into the lungs daily.     Vitamin D, Ergocalciferol, (DRISDOL) 50000 units CAPS capsule Take 50,000 Units by mouth every Saturday.   5   No current facility-administered medications for this visit.    Medication Side Effects: None  Allergies:  Allergies  Allergen Reactions   Codeine Hives and Nausea And Vomiting   Diclofenac Palpitations and Other (See Comments)    Other pains   Flagyl [Metronidazole] Other (See Comments)    Crazy    Imipramine Rash   Meloxicam Itching   Metoprolol Tartrate Itching and Rash   Olanzapine Other (See Comments)    Patient preference    Other Rash    Oil of olay moisturizing cream and eye cream caused measles like rash   Vistaril [Hydroxyzine Hcl] Itching and Rash    Past Medical History:  Diagnosis Date   Alcohol abuse with alcohol-induced mood disorder (HCC) 07/31/2016   Anxiety    Bipolar 1 disorder (HCC)    Cocaine abuse (HCC)    Constipation    COPD (chronic obstructive pulmonary disease) (HCC)    Dissociative disorder    Dysrhythmia    irregular heartrate   GERD (gastroesophageal reflux disease)    Glaucoma    Hypertension    Neuropathy of foot    bilateral   Osteoarthritis    osteoarthritis   PTSD (post-traumatic stress disorder)    PVD (peripheral vascular disease) (HCC) 2017   Sleep apnea    has had surgery    Family History  Problem Relation Age of Onset   Depression Mother     Anxiety disorder Mother    Other Mother        natural   Alcoholism Father    Alcohol abuse Father    Heart attack Father    Other Sister        kidney transplant   Goiter Sister    Diabetes Sister    Alcohol abuse Brother    Alcoholism Brother    Diabetes Brother     Social History   Socioeconomic History   Marital status: Single    Spouse name: Not on file   Number of children: 1   Years of education: 14   Highest education level: Associate degree: occupational,  technical, or vocational program  Occupational History    Employer: OTHER    Comment: disabled   Occupation: Retired  Tobacco Use   Smoking status: Every Day    Current packs/day: 0.25    Average packs/day: 0.3 packs/day for 20.0 years (5.0 ttl pk-yrs)    Types: Cigarettes   Smokeless tobacco: Never  Vaping Use   Vaping status: Never Used  Substance and Sexual Activity   Alcohol use: Not Currently   Drug use: Yes    Types: Cocaine    Comment: none since 2015/16   Sexual activity: Yes  Other Topics Concern   Not on file  Social History Narrative   Patient lives at home alone.   Caffeine Use: quit 2 yrs ago   Social Determinants of Corporate investment banker Strain: Not on file  Food Insecurity: Not on file  Transportation Needs: Not on file  Physical Activity: Not on file  Stress: Not on file  Social Connections: Unknown (02/26/2022)   Received from [facility], [facility]   Social Network    Social Network: Not on file  Intimate Partner Violence: Unknown (01/18/2022)   Received from [facility], [facility]   HITS    Physically Hurt: Not on file    Insult or Talk Down To: Not on file    Threaten Physical Harm: Not on file    Scream or Curse: Not on file    Past Medical History, Surgical history, Social history, and Family history were reviewed and updated as appropriate.   Please see review of systems for further details on the patient's review from today.   Objective:    Physical Exam:  There were no vitals taken for this visit.  Physical Exam Neurological:     Mental Status: She is alert and oriented to person, place, and time.  Psychiatric:        Attention and Perception: Attention and perception normal.        Mood and Affect: Mood normal.        Speech: Speech normal.        Behavior: Behavior normal. Behavior is cooperative.        Cognition and Memory: Cognition and memory normal.        Judgment: Judgment normal.     Comments: Insight intact     Lab Review:     Component Value Date/Time   NA 138 03/27/2023 1024   K 4.0 03/27/2023 1024   CL 108 03/27/2023 1024   CO2 20 03/27/2023 1024   GLUCOSE 143 (H) 03/27/2023 1024   BUN 14 03/27/2023 1024   CREATININE 0.98 03/27/2023 1024   CALCIUM 8.8 03/27/2023 1024   PROT 6.4 03/27/2023 1024   ALBUMIN 3.9 09/13/2019 2126   AST 13 03/27/2023 1024   ALT 11 03/27/2023 1024   ALKPHOS 54 09/13/2019 2126   BILITOT 0.3 03/27/2023 1024   GFRNONAA 55 (L) 08/03/2022 1342   GFRAA >60 09/13/2019 2126       Component Value Date/Time   WBC 4.9 03/27/2023 1024   RBC 3.90 03/27/2023 1024   HGB 12.5 03/27/2023 1024   HCT 38.2 03/27/2023 1024   PLT 289 03/27/2023 1024   MCV 97.9 03/27/2023 1024   MCH 32.1 03/27/2023 1024   MCHC 32.7 03/27/2023 1024   RDW 13.2 03/27/2023 1024   LYMPHSABS 2.6 08/03/2022 1342   MONOABS 0.5 08/03/2022 1342   EOSABS 0.2 08/03/2022 1342   BASOSABS 0.1 08/03/2022 1342  No results found for: "POCLITH", "LITHIUM"   No results found for: "PHENYTOIN", "PHENOBARB", "VALPROATE", "CBMZ"   .res Assessment: Plan:    Recommendations   Greater than 50% of  30  min telephonic interview with patient was spent on counseling and coordination of care.  We discussed her current level of reported stability. Her depression levels have been exacerbated by seasonal changes and the holidays.      We reviewed her medications and discussed various sleeping medications.   We  agreed to: To continue trazodone to 50-100 mg daily at bedtime To continue Seroquel to 100 mg at bedtime To continue Belsomra 15 mg take at bedtime  To continue Klonopin 0.5 mg 1-2 tablets daily as needed for severe anxiety Increase Latuda 60 mg daily. Take with 350 calorie meal or snack. To report side effects or worsening symptoms promptly To follow up in 8  weeks to reasess Provided emergency contact information Reviewed PDMP   Discussed potential metabolic side effects associated with atypical antipsychotics, as well as potential risk for movement side effects. Advised pt to contact office if movement side effects occur.           Jakaila was seen today for anxiety, depression, follow-up, medication refill, patient education and medication problem.  Diagnoses and all orders for this visit:  Insomnia due to other mental disorder -     traZODone (DESYREL) 50 MG tablet; Take 1-2 tablets by mouth at bedtime daily as needed for sleep. -     Suvorexant (BELSOMRA) 15 MG TABS; TAKE 1 TABLET BY MOUTH AT BEDTIME AS NEEDED FOR SLEEP -     QUEtiapine (SEROQUEL) 100 MG tablet; Take 1 tablet (100 mg total) by mouth at bedtime.  Bipolar 1 disorder (HCC) -     Lurasidone HCl 60 MG TABS; Take 0.5 tablets (30 mg total) by mouth daily after breakfast. -     QUEtiapine (SEROQUEL) 100 MG tablet; Take 1 tablet (100 mg total) by mouth at bedtime.  Generalized anxiety disorder -     Lurasidone HCl 60 MG TABS; Take 0.5 tablets (30 mg total) by mouth daily after breakfast. -     QUEtiapine (SEROQUEL) 100 MG tablet; Take 1 tablet (100 mg total) by mouth at bedtime.    Please see After Visit Summary for patient specific instructions.  No future appointments.  No orders of the defined types were placed in this encounter.     -------------------------------

## 2023-10-11 ENCOUNTER — Other Ambulatory Visit (HOSPITAL_COMMUNITY): Payer: Self-pay | Admitting: Internal Medicine

## 2023-10-11 DIAGNOSIS — G459 Transient cerebral ischemic attack, unspecified: Secondary | ICD-10-CM

## 2023-10-30 ENCOUNTER — Ambulatory Visit (HOSPITAL_COMMUNITY)
Admission: RE | Admit: 2023-10-30 | Discharge: 2023-10-30 | Discharge status: Home / Self Care | Payer: 59 | Source: Ambulatory Visit | Attending: Internal Medicine | Admitting: Internal Medicine

## 2023-10-30 ENCOUNTER — Ambulatory Visit (HOSPITAL_BASED_OUTPATIENT_CLINIC_OR_DEPARTMENT_OTHER)
Admission: RE | Admit: 2023-10-30 | Discharge: 2023-10-30 | Discharge status: Home / Self Care | Payer: 59 | Source: Ambulatory Visit | Attending: Internal Medicine | Admitting: Internal Medicine

## 2023-10-30 DIAGNOSIS — I10 Essential (primary) hypertension: Secondary | ICD-10-CM | POA: Insufficient documentation

## 2023-10-30 DIAGNOSIS — E119 Type 2 diabetes mellitus without complications: Secondary | ICD-10-CM | POA: Diagnosis not present

## 2023-10-30 DIAGNOSIS — E785 Hyperlipidemia, unspecified: Secondary | ICD-10-CM | POA: Insufficient documentation

## 2023-10-30 DIAGNOSIS — J449 Chronic obstructive pulmonary disease, unspecified: Secondary | ICD-10-CM | POA: Diagnosis not present

## 2023-10-30 DIAGNOSIS — G459 Transient cerebral ischemic attack, unspecified: Secondary | ICD-10-CM | POA: Diagnosis not present

## 2023-10-30 DIAGNOSIS — G4730 Sleep apnea, unspecified: Secondary | ICD-10-CM | POA: Diagnosis not present

## 2023-10-30 LAB — ECHOCARDIOGRAM COMPLETE
Area-P 1/2: 2.76 cm2
Calc EF: 51.8 %
S' Lateral: 3.3 cm
Single Plane A2C EF: 55.8 %
Single Plane A4C EF: 47.7 %

## 2023-11-06 ENCOUNTER — Ambulatory Visit: Payer: Medicare HMO | Admitting: Behavioral Health

## 2023-11-06 ENCOUNTER — Other Ambulatory Visit: Payer: Self-pay | Admitting: Behavioral Health

## 2023-11-06 ENCOUNTER — Telehealth: Payer: Self-pay | Admitting: Behavioral Health

## 2023-11-06 DIAGNOSIS — F319 Bipolar disorder, unspecified: Secondary | ICD-10-CM

## 2023-11-06 NOTE — Telephone Encounter (Signed)
Called patient and she said she is on a lot of medications and having a lot of physical issues. She does not want to take a medication for SE. Rx sent in November says to take 1/2 of a 60 mg tablet but note says Increase Latuda 60 mg daily. Take with 350 calorie meal or snack.   She is agreeable to stay on 30 mg until seen. I told her I would change appt to in person instead of phone visit.

## 2023-11-06 NOTE — Telephone Encounter (Signed)
Please change 11/27/23 visit to in person per [place]. Patient is aware.

## 2023-11-06 NOTE — Telephone Encounter (Signed)
Next appt is 11/27/23. Cassie Harrell is causing her mouth to twitch and she grinds her teeth. Can she get something else? Her phone number is [phone]. Pharmacy is:  [id number] [place], [place] - [address] AT [facility] [address] [facility] [address] [facility] [address]    Phone: [phone]  Fax: [phone]

## 2023-11-06 NOTE — Telephone Encounter (Addendum)
Patient was last seen in November and Jordan was increased at that time. She said she never increased dose to 60 mg because she didn't like the SE she was experiencing. She is reporting a mouth twice and teeth grinding. She is also taking Seroquel 100 mg. She is taking Belsomra 15 and trazodone 100 mg in order to sleep and occasionally will still wake during the night.  She has FU 2/11. She would like to stop the Jordan.

## 2023-11-06 NOTE — Telephone Encounter (Signed)
It was showing made on 1/20  as "in person".

## 2023-11-27 ENCOUNTER — Ambulatory Visit: Payer: 59 | Admitting: Behavioral Health

## 2023-11-28 ENCOUNTER — Ambulatory Visit: Payer: 59 | Admitting: Behavioral Health

## 2023-11-29 ENCOUNTER — Ambulatory Visit: Payer: 59 | Admitting: Behavioral Health

## 2023-11-29 ENCOUNTER — Encounter: Payer: Self-pay | Admitting: Behavioral Health

## 2023-11-29 DIAGNOSIS — F319 Bipolar disorder, unspecified: Secondary | ICD-10-CM | POA: Diagnosis not present

## 2023-11-29 DIAGNOSIS — F411 Generalized anxiety disorder: Secondary | ICD-10-CM | POA: Diagnosis not present

## 2023-11-29 DIAGNOSIS — F5105 Insomnia due to other mental disorder: Secondary | ICD-10-CM | POA: Diagnosis not present

## 2023-11-29 DIAGNOSIS — F99 Mental disorder, not otherwise specified: Secondary | ICD-10-CM | POA: Diagnosis not present

## 2023-11-29 MED ORDER — BELSOMRA 15 MG PO TABS: ORAL_TABLET | ORAL | 2 refills | Status: DC

## 2023-11-29 MED ORDER — QUETIAPINE FUMARATE 100 MG PO TABS: 100.0000 mg | ORAL_TABLET | Freq: Every day | ORAL | 1 refills | Status: DC

## 2023-11-29 MED ORDER — TRAZODONE HCL 50 MG PO TABS: ORAL_TABLET | ORAL | 3 refills | Status: DC

## 2023-11-29 MED ORDER — LURASIDONE HCL 60 MG PO TABS
30.0000 mg | ORAL_TABLET | Freq: Every day | ORAL | 1 refills | Status: DC
Start: 2023-11-29 — End: 2024-06-02

## 2023-11-29 NOTE — Progress Notes (Addendum)
[facility]  Patient ID: Cassie Harrell,  MRN: [id number]  PCP: Fleet Contras, MD  Date of Evaluation: 11/29/2023 Time spent:30 minutes  Chief Complaint:  Chief Complaint   Anxiety; Depression; Follow-up; Medication Refill; Medication Problem; Patient Education     HISTORY/CURRENT STATUS: HPI "Cassie Harrell", 73 -year-old African-American female presents via telphonic interview for follow up and medication management. Smiling and pleasant today. Doing much better post holidays. Learning to set boundaries with her son. Has not spoke to him since Christmas.  She is currently happy with her medications.   She is requesting dosage increase or adjustments with her medications. She reports anxiety as 3/10, and depression as 3/10.  Sleeping right now about 6-7 hours per night.  She is currently in a relationship.  She is retired with disability.  She denies any recent mania, no psychosis, no auditory or visual hallucinations.  No SI or HI.  She states that she feels safe with no additional risk.    Individual Medical History/ Review of Systems: Changes? :No   Allergies: Codeine, Diclofenac, Flagyl [metronidazole], Imipramine, Meloxicam, Metoprolol tartrate, Olanzapine, Other, and Vistaril [hydroxyzine hcl]  Current Medications:  Current Outpatient Medications:    albuterol (PROVENTIL HFA;VENTOLIN HFA) 108 (90 BASE) MCG/ACT inhaler, Inhale 2 puffs into the lungs every 6 (six) hours as needed for wheezing or shortness of breath. , Disp: , Rfl:    amLODipine (NORVASC) 5 MG tablet, Take 5 mg by mouth daily., Disp: , Rfl:    brimonidine-timolol (COMBIGAN) 0.2-0.5 % ophthalmic solution, Place 1 drop into both eyes every 12 (twelve) hours. , Disp: , Rfl:    estradiol (ESTRACE) 0.1 MG/GM vaginal cream, Place vaginally., Disp: , Rfl:    famotidine (PEPCID) 20 MG tablet, Take 1 tablet (20 mg total) by mouth 2 (two) times daily., Disp: 30 tablet, Rfl: 0   furosemide (LASIX) 20 MG tablet,  Take 20 mg by mouth 2 (two) times daily., Disp: , Rfl:    ibandronate (BONIVA) 150 MG tablet, Take 150 mg by mouth every 30 (thirty) days., Disp: , Rfl:    ibuprofen (ADVIL,MOTRIN) 800 MG tablet, Take 1 tablet (800 mg total) by mouth 3 (three) times daily. (Patient taking differently: Take 800 mg by mouth as needed.), Disp: 30 tablet, Rfl: 0   latanoprost (XALATAN) 0.005 % ophthalmic solution, Place 1 drop into both eyes at bedtime. , Disp: , Rfl:    linaclotide (LINZESS) 145 MCG CAPS capsule, Take 145 mcg by mouth daily as needed (ibs/ constipation). , Disp: , Rfl:    lisinopril (ZESTRIL) 10 MG tablet, Take 10 mg by mouth daily., Disp: , Rfl:    Lurasidone HCl 60 MG TABS, Take 0.5 tablets (30 mg total) by mouth daily after breakfast., Disp: 30 tablet, Rfl: 1   magnesium oxide (MAG-OX) 400 (240 Mg) MG tablet, Take 1 tablet by mouth daily., Disp: , Rfl:    metoprolol succinate (TOPROL-XL) 25 MG 24 hr tablet, TAKE 1 TABLET BY MOUTH EVERY DAY, Disp: 90 tablet, Rfl: 3   Omega-3 Fatty Acids (FISH OIL) 1200 MG CAPS, Take 1,200 mg by mouth daily. , Disp: , Rfl:    pantoprazole (PROTONIX) 40 MG tablet, Take 40 mg by mouth 2 (two) times daily., Disp: , Rfl:    QUEtiapine (SEROQUEL) 100 MG tablet, Take 1 tablet (100 mg total) by mouth at bedtime., Disp: 90 tablet, Rfl: 1   Suvorexant (BELSOMRA) 15 MG TABS, TAKE 1 TABLET BY MOUTH AT BEDTIME AS NEEDED FOR SLEEP, Disp: 30 tablet,  Rfl: 2   traZODone (DESYREL) 100 MG tablet, Take 1.5 tablets (150 mg total) by mouth at bedtime., Disp: 135 tablet, Rfl: 1   traZODone (DESYREL) 50 MG tablet, Take 1-2 tablets by mouth at bedtime daily as needed for sleep., Disp: 60 tablet, Rfl: 3   umeclidinium bromide (INCRUSE ELLIPTA) 62.5 MCG/INH AEPB, Inhale 1 puff into the lungs daily., Disp: , Rfl:    Vitamin D, Ergocalciferol, (DRISDOL) 50000 units CAPS capsule, Take 50,000 Units by mouth every Saturday. , Disp: , Rfl: 5 Medication Side Effects: none  Family Medical/ Social  History: Changes? No  MENTAL HEALTH EXAM:  There were no vitals taken for this visit.There is no height or weight on file to calculate BMI.  General Appearance: Casual, Neat, and Well Groomed  Eye Contact:  Good  Speech:  Clear and Coherent  Volume:  Normal  Mood:  NA  Affect:  Appropriate  Thought Process:  Coherent  Orientation:  Full (Time, Place, and Person)  Thought Content: Logical   Suicidal Thoughts:  No  Homicidal Thoughts:  No  Memory:  WNL  Judgement:  Good  Insight:  Good  Psychomotor Activity:  Normal  Concentration:  Concentration: Good  Recall:  Good  Fund of Knowledge: Good  Language: Good  Assets:  Desire for Improvement  ADL's:  Intact  Cognition: WNL  Prognosis:  Good    DIAGNOSES:    ICD-10-CM   1. Insomnia due to other mental disorder  F51.05 traZODone (DESYREL) 50 MG tablet   F99 QUEtiapine (SEROQUEL) 100 MG tablet    Suvorexant (BELSOMRA) 15 MG TABS    2. Bipolar 1 disorder (HCC)  F31.9 QUEtiapine (SEROQUEL) 100 MG tablet    Lurasidone HCl 60 MG TABS    3. Generalized anxiety disorder  F41.1 QUEtiapine (SEROQUEL) 100 MG tablet    Lurasidone HCl 60 MG TABS      Receiving Psychotherapy: No    RECOMMENDATIONS:   Recommendations   Greater than 50% of  30  min telephonic interview with patient was spent on counseling and coordination of care.  Talked about her current stability. Depression levels have declined since holidays over. She is in a loving relationship with man in [place]. Content right now. We reviewed her medications today and will not be making any adjustments.   AIMS Score=0 11/29/2023    We reviewed her medications and discussed various sleeping medications.   We agreed to: To continue trazodone to 50-100 mg daily at bedtime To continue Seroquel to 100 mg at bedtime To continue Belsomra 15 mg take at bedtime  To continue Klonopin 0.5 mg 1-2 tablets daily as needed for severe anxiety Reduced  Latuda to 30  mg daily. Take  with 350 calorie meal or snack. To report side effects or worsening symptoms promptly To follow up in 3  months  to reasess Provided emergency contact information Reviewed PDMP   Discussed potential metabolic side effects associated with atypical antipsychotics, as well as potential risk for movement side effects. Advised pt to contact office if movement side effects occur.     Joan Flores, NP

## 2023-12-06 ENCOUNTER — Other Ambulatory Visit: Payer: Self-pay | Admitting: Behavioral Health

## 2023-12-06 DIAGNOSIS — F319 Bipolar disorder, unspecified: Secondary | ICD-10-CM

## 2023-12-11 ENCOUNTER — Other Ambulatory Visit: Payer: Self-pay

## 2023-12-11 ENCOUNTER — Encounter (HOSPITAL_COMMUNITY): Payer: Self-pay

## 2023-12-11 ENCOUNTER — Emergency Department (HOSPITAL_COMMUNITY)
Admission: EM | Admit: 2023-12-11 | Discharge: 2023-12-12 | Disposition: A | Discharge status: Home / Self Care | Payer: 59 | Attending: Emergency Medicine | Admitting: Emergency Medicine

## 2023-12-11 DIAGNOSIS — R202 Paresthesia of skin: Secondary | ICD-10-CM

## 2023-12-11 DIAGNOSIS — R29810 Facial weakness: Secondary | ICD-10-CM | POA: Insufficient documentation

## 2023-12-11 DIAGNOSIS — R531 Weakness: Secondary | ICD-10-CM | POA: Insufficient documentation

## 2023-12-11 DIAGNOSIS — R29898 Other symptoms and signs involving the musculoskeletal system: Secondary | ICD-10-CM | POA: Diagnosis not present

## 2023-12-11 LAB — RESP PANEL BY RT-PCR (RSV, FLU A&B, COVID)  RVPGX2
Influenza A by PCR: NEGATIVE
Influenza B by PCR: NEGATIVE
Resp Syncytial Virus by PCR: NEGATIVE
SARS Coronavirus 2 by RT PCR: NEGATIVE

## 2023-12-11 LAB — BASIC METABOLIC PANEL
Anion gap: 7 (ref 5–15)
BUN: 5 mg/dL — ABNORMAL LOW (ref 8–23)
CO2: 22 mmol/L (ref 22–32)
Calcium: 8.5 mg/dL — ABNORMAL LOW (ref 8.9–10.3)
Chloride: 108 mmol/L (ref 98–111)
Creatinine, Ser: 0.9 mg/dL (ref 0.44–1.00)
GFR, Estimated: >60 mL/min (ref >60)
Glucose, Bld: 103 mg/dL — ABNORMAL HIGH (ref 70–99)
Potassium: 3.6 mmol/L (ref 3.5–5.1)
Sodium: 137 mmol/L (ref 135–145)

## 2023-12-11 LAB — CBC
HCT: 38.2 % (ref 36.0–46.0)
Hemoglobin: 13.2 g/dL (ref 12.0–15.0)
MCH: 32.8 pg (ref 26.0–34.0)
MCHC: 34.6 g/dL (ref 30.0–36.0)
MCV: 94.8 fL (ref 80.0–100.0)
Platelets: 309 10*3/uL (ref 150–400)
RBC: 4.03 MIL/uL (ref 3.87–5.11)
RDW: 15.1 % (ref 11.5–15.5)
WBC: 4.9 10*3/uL (ref 4.0–10.5)
nRBC: 0 % (ref 0.0–0.2)

## 2023-12-11 LAB — CBG MONITORING, ED: Glucose-Capillary: 98 mg/dL (ref 70–99)

## 2023-12-11 NOTE — ED Provider Triage Note (Signed)
 Emergency Medicine Provider Triage Evaluation Note  Cassie Harrell , a 73 y.o. female  was evaluated in triage.  Pt complains of syncopal episode.  Reports that [place] [employer] [place] filling out a report related to breaking and entering.  States that she was using the computer when she had a sudden onset of lightheadedness and then was lowered to the ground.  She reports that she fully blacked out, does not remember these events.  Denies any preceding chest pain or shortness of breath.  Reports that she has been nauseous today but denies vomiting or diarrhea.  Denies any recent fevers, sore throat, bodyaches or chills, abdominal pain.  Reports that she woke up yesterday morning and felt her left eyelid was drooping but states this is resolved.  Reports recent TIA, denies blood thinners.  Review of Systems  Positive:  Negative:   Physical Exam  BP 117/75 (BP Location: Left Arm)   Pulse 72   Temp 98.4 F (36.9 C) (Oral)   Resp 20   Ht 5\' 3"  (1.6 m)   Wt 73.5 kg   SpO2 100%   BMI 28.70 kg/m  Gen:   Awake, no distress   Resp:  Normal effort  MSK:   Moves extremities without difficulty  Other:  No focal neurodeficits.  CTAB.  Abdomen soft and nontender.  Medical Decision Making  Medically screening exam initiated at 12:24 PM.  Appropriate orders placed.  Cassie Harrell was informed that the remainder of the evaluation will be completed by another provider, this initial triage assessment does not replace that evaluation, and the importance of remaining in the ED until their evaluation is complete.     Al Decant, PA-C 12/11/23 1227

## 2023-12-11 NOTE — ED Triage Notes (Signed)
 Pt had a near syncope even ttoday and nuseated. Pt lowered herself to floor. Pt did not fall. Pt has felt "off" fpr 2 weeks now. Pt felt like her left eye was drooping. Axox4.

## 2023-12-12 ENCOUNTER — Emergency Department (HOSPITAL_COMMUNITY): Payer: 59

## 2023-12-12 DIAGNOSIS — R29898 Other symptoms and signs involving the musculoskeletal system: Secondary | ICD-10-CM

## 2023-12-12 DIAGNOSIS — R29810 Facial weakness: Secondary | ICD-10-CM | POA: Diagnosis not present

## 2023-12-12 LAB — URINALYSIS, ROUTINE W REFLEX MICROSCOPIC
Bilirubin Urine: NEGATIVE
Glucose, UA: NEGATIVE mg/dL
Hgb urine dipstick: NEGATIVE
Ketones, ur: 5 mg/dL — AB
Leukocytes,Ua: NEGATIVE
Nitrite: NEGATIVE
Protein, ur: NEGATIVE mg/dL
Specific Gravity, Urine: 1.002 — ABNORMAL LOW (ref 1.005–1.030)
pH: 7 (ref 5.0–8.0)

## 2023-12-12 MED ORDER — ONDANSETRON HCL 4 MG/2ML IJ SOLN
4.0000 mg | Freq: Once | INTRAMUSCULAR | Status: AC
  Administered 2023-12-12: 4 mg via INTRAVENOUS
  Filled 2023-12-12: qty 2

## 2023-12-12 NOTE — Discharge Instructions (Signed)
 Please follow-up with neurology in regards to your symptoms and ER visit.  Today your labs and imaging are reassuring however you will need to follow-up with neurology in the office to see if there is any additional imaging that needs to be done.  Please take your medications as prescribed and symptoms change or worsen please return to the ER.

## 2023-12-12 NOTE — ED Provider Notes (Signed)
 Patient given in sign out by Dahlia Client, PA-C.  Please review their note for patient HPI, physical exam, workup.  At this time the plan is follow-up on imaging.  MRI is negative for any acute pathology.  Consults: Bhagat, MD Neurology  I went to go evaluate the patient and patient does have some minor decreased grip strength on the left side however does have sensation in intact distally.  Patient is not complaining of any neck pain or facial numbness or pain.  On my exam patient does have questionable facial droop but is not slurring words or not endorsing any vision changes.  Patient does have history of diabetes and states that the numbness goes from her fingertips to her wrist which sound suspicious of peripheral neuropathy.  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  I spoke to neurology who agrees that patient can follow-up in the office if she is only concerned about her hand for outpatient imaging does not need any emergent imaging.  Will discharge with neurology follow-up.  Patient given return precautions.  Patient verbalized understanding symptoms with this plan.  Patient stable to be discharged.   Netta Corrigan, PA-C 12/12/23 0815    Marily Memos, MD 12/12/23 [phone]

## 2023-12-12 NOTE — Progress Notes (Signed)
  These are curbside recommendations based upon the information readily available in the chart on brief review as well as history and examination information provided to me by requesting provider and do not replace a full detailed consult   I am asked to comment on further workup for this patient in discussion with ED PA Wynonia Hazard  Patient presents with 2 days of left arm weakness, questionable left-sided facial droop as well.  MRI brain was negative for acute intracranial pathology.  Weakness is described to me as quite mild, 4+/5, and ED provider feels facial droop is subtle at best  We discussed that if patient truly does have symptoms in the face as well as the arm, brain would be the most reasonable neurological localization for the symptoms.  However she does have a known history of degenerative disc disease including prior lumbar spine surgery, and does have an MRI from 2017 which I personally reviewed demonstrating degenerative disc disease but did affect the left side more than the right  Defer to ED providers clinical assessment of patient's severity of weakness and how quickly her symptoms are progressing.  Certainly reasonable to follow-up outpatient if her symptoms are very mild and have not been rapidly progressive.  Would consider C-spine localization of her symptoms.  With negative MRI brain and 2 days of symptoms I do not think stroke/TIA is the likely explanation   Brooke Dare MD-PhD [facility] [phone] 12 minutes spent in care of this patient, predominantly in discussion with ED provider   MRI brain personally reviewed, agree with radiology no acute intracranial process  MRI C-spine from 2017 personally reviewed, agree with radiology impression as well as findings from detailed report as below 1. 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 Resultant mild canal stenosis at these levels. 2. Uncovertebral  spurring at C4-5 and C5-6 with resultant moderate bilateral foraminal stenosis. Please see above report for a full description of these findings. "C3-C4: Diffuse degenerative disc bulge with mild bilateral uncovertebral hypertrophy. Central disc protrusion indents the ventral thecal sac. Protruding disc is slightly eccentric to the left extending towards the left neural foramen. Resultant mild canal stenosis. Mild to moderate left with mild right foraminal narrowing."   Current vital signs: BP 136/78 (BP Location: Right Arm)   Pulse 73   Temp 98.1 F (36.7 C) (Oral)   Resp 17   Ht 5\' 3"  (1.6 m)   Wt 73.5 kg   SpO2 100%   BMI 28.70 kg/m  Vital signs in last 24 hours: Temp:  [97.7 F (36.5 C)-98.4 F (36.9 C)] 98.1 F (36.7 C) (02/26 0719) Pulse Rate:  [71-81] 73 (02/26 0719) Resp:  [11-22] 17 (02/26 0719) BP: (117-145)/(54-99) 136/78 (02/26 0719) SpO2:  [87 %-100 %] 100 % (02/26 0719) Weight:  [73.5 kg] 73.5 kg (02/25 1211)   Basic Metabolic Panel: Recent Labs  Lab 12/11/23 1243  NA 137  K 3.6  CL 108  CO2 22  GLUCOSE 103*  BUN 5*  CREATININE 0.90  CALCIUM 8.5*    CBC: Recent Labs  Lab 12/11/23 1243  WBC 4.9  HGB 13.2  HCT 38.2  MCV 94.8  PLT 309    Coagulation Studies: No results for input(s): "LABPROT", "INR" in the last 72 hours.

## 2023-12-12 NOTE — ED Provider Notes (Signed)
 [facility]PT [facility] Emergency Department Provider Note MRN:  [id number]  Arrival date & time: 12/12/23     Chief Complaint   Near Syncope   History of Present Illness   Cassie Harrell is a 73 y.o. year-old female presents to the ED with chief complaint of left-sided facial droop and left arm weakness.  Patient states that she noticed the symptoms 2 days ago.  She states that she has had workup for TIA in the past, and she is concerned about the same.  She still has some weakness and left facial droop.  Additionally, patient states that a few days ago she was at the police department filling out a report and felt like she was going to pass out.  She states that she woke up with several police officers around her.  History provided by patient.   Review of Systems  Pertinent positive and negative review of systems noted in HPI.    Physical Exam   Vitals:   12/12/23 0515 12/12/23 0530  BP: (!) 125/99 (!) 119/54  Pulse: 74 75  Resp:    Temp:    SpO2: 93% 93%    CONSTITUTIONAL:  well-appearing, NAD NEURO:  Alert and oriented x 3, CN 3-12 grossly intact EYES:  eyes equal and reactive ENT/NECK:  Supple, no stridor  CARDIO:  normal rate, regular rhythm, appears well-perfused  PULM:  No respiratory distress, CTAB GI/GU:  non-distended,  MSK/SPINE:  No gross deformities, no edema, moves all extremities  SKIN:  no rash, atraumatic   *Additional and/or pertinent findings included in MDM below  Diagnostic and Interventional Summary    EKG Interpretation Date/Time:    Ventricular Rate:    PR Interval:    QRS Duration:    QT Interval:    QTC Calculation:   R Axis:      Text Interpretation:         Labs Reviewed  BASIC METABOLIC PANEL - Abnormal; Notable for the following components:      Result Value   Glucose, Bld 103 (*)    BUN 5 (*)    Calcium 8.5 (*)    All other components within normal limits  RESP PANEL BY RT-PCR (RSV, FLU A&B,  COVID)  RVPGX2  CBC  URINALYSIS, ROUTINE W REFLEX MICROSCOPIC  CBG MONITORING, ED    MR BRAIN WO CONTRAST    (Results Pending)    Medications - No data to display   Procedures  /  Critical Care Procedures  ED Course and Medical Decision Making  I have reviewed the triage vital signs, the nursing notes, and pertinent available records from the EMR.  Social Determinants Affecting Complexity of Care: Patient has no clinically significant social determinants affecting this chief complaint..   ED Course:    Medical Decision Making Patient here complaining of left facial droop and left arm weakness that started 2 days ago.  She is outside of the code stroke/TNK window.  She does have some left arm weakness and slight asymmetry to the face.  Basic labs ordered in triage are thus far reassuring.  Will add MRI to look for stroke.     Amount and/or Complexity of Data Reviewed Radiology: ordered.    Details: MRI brain pending.          Consultants:    Treatment and Plan: Patient signed out to oncoming team, who will continue care.  Plan: Follow-up on MRI.    Final Clinical Impressions(s) / ED Diagnoses  No  diagnosis found.  ED Discharge Orders     None         Discharge Instructions Discussed with and Provided to Patient:   Discharge Instructions   None      Roxy Horseman, PA-C 12/12/23 5621    Mesner, Barbara Cower, MD 12/12/23 [phone]

## 2023-12-12 NOTE — ED Notes (Signed)
 Patient ambulated to the bathroom with a steady gait with no assistance

## 2024-02-05 ENCOUNTER — Encounter: Payer: Self-pay | Admitting: Diagnostic Neuroimaging

## 2024-02-05 ENCOUNTER — Ambulatory Visit (INDEPENDENT_AMBULATORY_CARE_PROVIDER_SITE_OTHER): Payer: Medicare Other | Admitting: Diagnostic Neuroimaging

## 2024-02-05 VITALS — BP 118/69 | HR 78 | Ht 63.0 in | Wt 158.0 lb

## 2024-02-05 DIAGNOSIS — R200 Anesthesia of skin: Secondary | ICD-10-CM

## 2024-02-05 DIAGNOSIS — R55 Syncope and collapse: Secondary | ICD-10-CM | POA: Diagnosis not present

## 2024-02-05 NOTE — Patient Instructions (Addendum)
  INTERMITTENT LEFT ARM / LEG NUMBNESS (since ~Nov 2024; lasting few minutes) - likely related to intermittent nerve impingement - recommend PT / OT evaluation exercises   TIA (right eye vision loss x 1-2 minutes; Nov 2024) - workup completed - start aspirin  81mg  daily - continue atorvastatin, BP control, DM control   RECURRENT SYNCOPE / PRE-SYNCOPE (prodromal lightheaded, dizzy; no tongue biting; no incontinence; no prolonged post-ictal confusion; last event 12/11/23)  - follow up with PCP / cardiology  - optimize nutrition, hydration, sleep, stress mgmt; review polypharmacy with PCP and psychiatry  - According to [place] law, you can not drive unless you are syncope free for at least 6 months and under physician's care.   - Please maintain precautions. Do not participate in activities where a loss of awareness could harm you or someone else. No swimming alone, no tub bathing, no hot tubs, no driving, no operating motorized vehicles (cars, ATVs, motocycles, etc), lawnmowers, power tools or firearms. No standing at heights, such as rooftops, ladders or stairs. Avoid hot objects such as stoves, heaters, open fires. Wear a helmet when riding a bicycle, scooter, skateboard, etc. and avoid areas of traffic. Set your water heater to 120 degrees or less.

## 2024-02-05 NOTE — Progress Notes (Signed)
 [facility]  PATIENT: Cassie Harrell DOB: 01/01/1951  REFERRING CLINICIAN: Denese Finn, PA-C HISTORY FROM: patient  REASON FOR VISIT: new consult   HISTORICAL  CHIEF COMPLAINT:  Chief Complaint  Patient presents with   Extremity Weakness    Rm 6 alone Pt is well, reports she has been having intermitting tingling, numbness and swelling on L side for about a yr.   She also reports passing out     HISTORY OF PRESENT ILLNESS:   73 year old female here for evaluation of intermittent numbness tingling sensations.  Has had several episodes of left arm and left leg, sometimes independent, sometimes simultaneous sensations lasting few minutes at a time.  Examined going on for last few months.  Also had episode of transient right eye vision loss lasting for 1 to 2 minutes in November 2024.  TIA workup was completed by PCP.  Also today patient mentions several episodes of recurrent syncope, most recently on 12/11/2023.  Has had 3 other episodes from 2023-2025.  These all included prodromal lightheadedness, dizziness, swimmy head sensation followed by brief syncope.  No tongue biting, incontinence or convulsions.  Felt somewhat drained of energy for 3 to 5 minutes and then returned quickly to baseline.  No specific triggering factors.   REVIEW OF SYSTEMS: Full 14 system review of systems performed and negative with exception of: as per HPI.  ALLERGIES: Allergies  Allergen Reactions   Diclofenac Palpitations and Other (See Comments)    Other pains   Flagyl  [Metronidazole ] Other (See Comments)    Crazy    Imipramine Rash   Meloxicam Itching   Metoprolol  Tartrate Itching and Rash   Olanzapine Other (See Comments)    Patient preference    Other Rash    Oil of olay moisturizing cream and eye cream caused measles like rash   Vistaril [Hydroxyzine Hcl] Itching and Rash    HOME MEDICATIONS: Outpatient Medications Prior to Visit  Medication Sig Dispense  Refill   albuterol  (PROVENTIL  HFA;VENTOLIN  HFA) 108 (90 BASE) MCG/ACT inhaler Inhale 2 puffs into the lungs every 6 (six) hours as needed for wheezing or shortness of breath.      atorvastatin (LIPITOR) 20 MG tablet Take 20 mg by mouth daily.     brimonidine -timolol  (COMBIGAN ) 0.2-0.5 % ophthalmic solution Place 1 drop into both eyes every 12 (twelve) hours.      estradiol (ESTRACE) 0.1 MG/GM vaginal cream Place vaginally.     famotidine  (PEPCID ) 20 MG tablet Take 1 tablet (20 mg total) by mouth 2 (two) times daily. 30 tablet 0   furosemide (LASIX) 20 MG tablet Take 20 mg by mouth 2 (two) times daily.     ibandronate (BONIVA) 150 MG tablet Take 150 mg by mouth every 30 (thirty) days.     ibuprofen  (ADVIL ,MOTRIN ) 800 MG tablet Take 1 tablet (800 mg total) by mouth 3 (three) times daily. (Patient taking differently: Take 800 mg by mouth as needed.) 30 tablet 0   latanoprost  (XALATAN ) 0.005 % ophthalmic solution Place 1 drop into both eyes at bedtime.      magnesium oxide (MAG-OX) 400 (240 Mg) MG tablet Take 1 tablet by mouth daily.     metoprolol  succinate (TOPROL -XL) 25 MG 24 hr tablet TAKE 1 TABLET BY MOUTH EVERY DAY 90 tablet 3   Omega-3 Fatty Acids (FISH OIL) 1200 MG CAPS Take 1,200 mg by mouth daily.      OZEMPIC, 1 MG/DOSE, 4 MG/3ML SOPN Inject 1 mg into the skin once  a week.     pantoprazole  (PROTONIX ) 40 MG tablet Take 40 mg by mouth 2 (two) times daily.     QUEtiapine  (SEROQUEL ) 100 MG tablet Take 1 tablet (100 mg total) by mouth at bedtime. 90 tablet 1   Suvorexant  (BELSOMRA ) 15 MG TABS TAKE 1 TABLET BY MOUTH AT BEDTIME AS NEEDED FOR SLEEP 30 tablet 2   tiZANidine  (ZANAFLEX ) 4 MG tablet Take 4 mg by mouth 2 (two) times daily.     traZODone  (DESYREL ) 50 MG tablet Take 1-2 tablets by mouth at bedtime daily as needed for sleep. 60 tablet 3   umeclidinium bromide  (INCRUSE ELLIPTA ) 62.5 MCG/INH AEPB Inhale 1 puff into the lungs daily.     amLODipine (NORVASC) 5 MG tablet Take 5 mg by mouth  daily. (Patient not taking: Reported on 02/05/2024)     linaclotide  (LINZESS ) 145 MCG CAPS capsule Take 145 mcg by mouth daily as needed (ibs/ constipation).  (Patient not taking: Reported on 02/05/2024)     lisinopril  (ZESTRIL ) 10 MG tablet Take 10 mg by mouth daily. (Patient not taking: Reported on 02/05/2024)     Lurasidone  HCl 60 MG TABS Take 0.5 tablets (30 mg total) by mouth daily after breakfast. (Patient not taking: Reported on 02/05/2024) 30 tablet 1   traZODone  (DESYREL ) 100 MG tablet Take 1.5 tablets (150 mg total) by mouth at bedtime. (Patient not taking: Reported on 02/05/2024) 135 tablet 1   Vitamin D , Ergocalciferol , (DRISDOL ) 50000 units CAPS capsule Take 50,000 Units by mouth every Saturday.  (Patient not taking: Reported on 02/05/2024)  5   No facility-administered medications prior to visit.    PAST MEDICAL HISTORY: Past Medical History:  Diagnosis Date   Alcohol abuse with alcohol-induced mood disorder (HCC) 07/31/2016   Anxiety    Bipolar 1 disorder (HCC)    Cocaine abuse (HCC)    Constipation    COPD (chronic obstructive pulmonary disease) (HCC)    Dissociative disorder    Dysrhythmia    irregular heartrate   GERD (gastroesophageal reflux disease)    Glaucoma    Hypertension    Neuropathy of foot    bilateral   Osteoarthritis    osteoarthritis   PTSD (post-traumatic stress disorder)    PVD (peripheral vascular disease) (HCC) 2017   Sleep apnea    has had surgery    PAST SURGICAL HISTORY: Past Surgical History:  Procedure Laterality Date   ABDOMINAL HYSTERECTOMY     APPENDECTOMY     CESAREAN SECTION     COLONOSCOPY     EYE SURGERY     FINGER SURGERY     LUMBAR LAMINECTOMY/DECOMPRESSION MICRODISCECTOMY Left 06/20/2017   Procedure: MICRODISCECTOMY LUMBAR 4- LUMBAR 5 LEFT;  Surgeon: Audie Bleacher, MD;  Location: [facility] OR;  Service: Neurosurgery;  Laterality: Left;  MICRODISCECTOMY LUMBAR 4- LUMBAR 5 LEFT   REVISION OF SCAR TISSUE RECTUS MUSCLE     TONSILLECTOMY      UVULOPALATOPHARYNGOPLASTY      FAMILY HISTORY: Family History  Problem Relation Age of Onset   Depression Mother    Anxiety disorder Mother    Other Mother        natural   Alcoholism Father    Alcohol abuse Father    Heart attack Father    Other Sister        kidney transplant   Goiter Sister    Diabetes Sister    Alcohol abuse Brother    Alcoholism Brother    Diabetes Brother  SOCIAL HISTORY: Social History   Socioeconomic History   Marital status: Single    Spouse name: Not on file   Number of children: 1   Years of education: 14   Highest education level: Associate degree: occupational, Scientist, product/process development, or vocational program  Occupational History    Employer: OTHER    Comment: disabled   Occupation: Retired  Tobacco Use   Smoking status: Every Day    Current packs/day: 0.25    Average packs/day: 0.3 packs/day for 20.0 years (5.0 ttl pk-yrs)    Types: Cigarettes   Smokeless tobacco: Never  Vaping Use   Vaping status: Never Used  Substance and Sexual Activity   Alcohol use: Not Currently   Drug use: Yes    Types: Cocaine    Comment: none since 2015/16   Sexual activity: Yes  Other Topics Concern   Not on file  Social History Narrative   Patient lives at home alone.   Caffeine Use: quit 2 yrs ago   Social Drivers of Corporate investment banker Strain: Not on file  Food Insecurity: Not on file  Transportation Needs: Not on file  Physical Activity: Not on file  Stress: Not on file  Social Connections: Unknown (02/26/2022)   Received from [facility], [facility]   Social Network    Social Network: Not on file  Intimate Partner Violence: Unknown (01/18/2022)   Received from [facility], [facility]   HITS    Physically Hurt: Not on file    Insult or Talk Down To: Not on file    Threaten Physical Harm: Not on file    Scream or Curse: Not on file     PHYSICAL EXAM  GENERAL EXAM/CONSTITUTIONAL: Vitals:  Vitals:   02/05/24 1005  BP:  118/69  Pulse: 78  Weight: 158 lb (71.7 kg)  Height: 5\' 3"  (1.6 m)   Body mass index is 27.99 kg/m. Wt Readings from Last 3 Encounters:  02/05/24 158 lb (71.7 kg)  12/11/23 162 lb (73.5 kg)  01/02/23 172 lb (78 kg)   Patient is in no distress; well developed, nourished and groomed; neck is supple  CARDIOVASCULAR: Examination of carotid arteries is normal; no carotid bruits Regular rate and rhythm, no murmurs Examination of peripheral vascular system by observation and palpation is normal  EYES: Ophthalmoscopic exam of optic discs and posterior segments is normal; no papilledema or hemorrhages No results found.  MUSCULOSKELETAL: Gait, strength, tone, movements noted in Neurologic exam below  NEUROLOGIC: MENTAL STATUS:      No data to display         awake, alert, oriented to person, place and time recent and remote memory intact normal attention and concentration language fluent, comprehension intact, naming intact fund of knowledge appropriate  CRANIAL NERVE:  2nd - no papilledema on fundoscopic exam 2nd, 3rd, 4th, 6th - pupils equal and reactive to light, visual fields full to confrontation, extraocular muscles intact, no nystagmus 5th - facial sensation symmetric 7th - facial strength symmetric 8th - hearing intact 9th - palate elevates symmetrically, uvula midline 11th - shoulder shrug symmetric 12th - tongue protrusion midline  MOTOR:  normal bulk and tone, full strength in the BUE, BLE  SENSORY:  normal and symmetric to light touch, temperature, vibration  COORDINATION:  finger-nose-finger, fine finger movements normal  REFLEXES:  deep tendon reflexes TRACE and symmetric  GAIT/STATION:  narrow based gait     DIAGNOSTIC DATA (LABS, IMAGING, TESTING) - I reviewed patient records, labs, notes,  testing and imaging myself where available.  Lab Results  Component Value Date   WBC 4.9 12/11/2023   HGB 13.2 12/11/2023   HCT 38.2 12/11/2023    MCV 94.8 12/11/2023   PLT 309 12/11/2023      Component Value Date/Time   NA 137 12/11/2023 1243   K 3.6 12/11/2023 1243   CL 108 12/11/2023 1243   CO2 22 12/11/2023 1243   GLUCOSE 103 (H) 12/11/2023 1243   BUN 5 (L) 12/11/2023 1243   CREATININE 0.90 12/11/2023 1243   CREATININE 0.98 03/27/2023 1024   CALCIUM 8.5 (L) 12/11/2023 1243   PROT 6.4 03/27/2023 1024   ALBUMIN 3.9 09/13/2019 2126   AST 13 03/27/2023 1024   ALT 11 03/27/2023 1024   ALKPHOS 54 09/13/2019 2126   BILITOT 0.3 03/27/2023 1024   GFRNONAA >60 12/11/2023 1243   GFRAA >60 09/13/2019 2126   Lab Results  Component Value Date   CHOL 129 03/27/2023   HDL 55 03/27/2023   LDLCALC 51 03/27/2023   TRIG 143 03/27/2023   CHOLHDL 2.3 03/27/2023   Lab Results  Component Value Date   HGBA1C 5.2 01/07/2014   Lab Results  Component Value Date   VITAMINB12 542 10/26/2021   Lab Results  Component Value Date   TSH 1.61 03/27/2023    08/31/16 MRI cervical spine 1. 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 Resultant mild canal stenosis at these levels. 2. Uncovertebral spurring at C4-5 and C5-6 with resultant moderate bilateral foraminal stenosis. Please see above report for a full description of these findings.  08/31/16 MRI lumbar spine 1. Left subarticular disc protrusion at L4-5, encroaching upon the left lateral recess, and potentially affecting the transiting left L5 nerve root. Overall, size of this protruding disc is slightly increased relative to previous MRI from 01/19/2014. 2. Mild facet hypertrophy at L2-3 through L4-5 as above.  10/30/23 carotid u/s Right Carotid: Velocities in the right ICA are consistent with a 1-39%  stenosis.   Left Carotid: Velocities in the left ICA are consistent with a 1-39%  stenosis.   Vertebrals: Bilateral vertebral arteries demonstrate antegrade flow.  Subclavians: Normal flow hemodynamics were seen in bilateral  subclavian arteries.   10/30/23 TTE 1. Left ventricular ejection fraction, by estimation, is 50 to 55%. The  left ventricle has low normal function. The left ventricle has no regional  wall motion abnormalities. Left ventricular diastolic parameters are  consistent with Grade I diastolic  dysfunction (impaired relaxation).   2. Right ventricular systolic function is normal. The right ventricular  size is normal. Tricuspid regurgitation signal is inadequate for assessing  PA pressure.   3. The mitral valve is normal in structure. Trivial mitral valve  regurgitation.   4. The aortic valve is tricuspid. Aortic valve regurgitation is not  visualized.   5. The inferior vena cava is normal in size with greater than 50%  respiratory variability, suggesting right atrial pressure of 3 mmHg.   12/12/23 MRI brain [I reviewed images myself and agree with interpretation. -VRP]  Negative for infarct or other acute finding.    ASSESSMENT AND PLAN  73 y.o. year old female here with:  Dx:  1. Numbness   2. Syncope, unspecified syncope type     PLAN:  INTERMITTENT LEFT ARM / LEG NUMBNESS (since ~Nov 2024; lasting few minutes) - likely related to intermittent nerve impingement (cervical and lumbar) - recommend PT / OT evaluation  TIA (right eye vision loss x 1-2  minutes; Nov 2024) - workup completed - start aspirin  81mg  daily - continue atorvastatin, BP control, DM control  RECURRENT SYNCOPE / PRE-SYNCOPE (prodromal lightheaded, dizzy; no tongue biting; no incontinence; no prolonged post-ictal confusion)  - follow up with PCP / cardiology  - optimize nutrition, hydration, sleep, stress mgmt; review polypharmacy with PCP and psychiatry  - According to [place] law, you can not drive unless you are syncope free for at least 6 months and under physician's care.   - Please maintain precautions. Do not participate in activities where a loss of awareness could harm you or someone else. No swimming  alone, no tub bathing, no hot tubs, no driving, no operating motorized vehicles (cars, ATVs, motocycles, etc), lawnmowers, power tools or firearms. No standing at heights, such as rooftops, ladders or stairs. Avoid hot objects such as stoves, heaters, open fires. Wear a helmet when riding a bicycle, scooter, skateboard, etc. and avoid areas of traffic. Set your water heater to 120 degrees or less.   Return for return to PCP, pending if symptoms worsen or fail to improve.  I reviewed images, labs, notes, records myself. I summarized findings and reviewed with patient, for this high risk condition (recurrent syncope) requiring high complexity decision making.    Omega Bible, MD 02/05/2024, 10:34 AM Certified in Neurology, Neurophysiology and Neuroimaging  [facility] [address], [address] [place], [place] [address] [phone]

## 2024-02-26 ENCOUNTER — Encounter: Payer: Self-pay | Admitting: Behavioral Health

## 2024-02-26 ENCOUNTER — Ambulatory Visit: Payer: 59 | Admitting: Behavioral Health

## 2024-02-26 DIAGNOSIS — F319 Bipolar disorder, unspecified: Secondary | ICD-10-CM

## 2024-02-26 DIAGNOSIS — F5105 Insomnia due to other mental disorder: Secondary | ICD-10-CM

## 2024-02-26 DIAGNOSIS — F99 Mental disorder, not otherwise specified: Secondary | ICD-10-CM

## 2024-02-26 DIAGNOSIS — F411 Generalized anxiety disorder: Secondary | ICD-10-CM

## 2024-02-26 MED ORDER — BELSOMRA 15 MG PO TABS: ORAL_TABLET | ORAL | 3 refills | Status: DC

## 2024-02-26 MED ORDER — TRAZODONE HCL 50 MG PO TABS: ORAL_TABLET | ORAL | 3 refills | Status: DC

## 2024-02-26 MED ORDER — CLONAZEPAM 0.5 MG PO TABS: 0.5000 mg | ORAL_TABLET | Freq: Two times a day (BID) | ORAL | 1 refills | Status: DC | PRN

## 2024-02-26 MED ORDER — QUETIAPINE FUMARATE 100 MG PO TABS: 100.0000 mg | ORAL_TABLET | Freq: Every day | ORAL | 1 refills | Status: DC

## 2024-02-26 NOTE — Progress Notes (Signed)
 Cassie Harrell [id number] 1950/12/12 72 y.o.  Virtual Visit via Telephone Note  I connected with pt on 02/26/24 at 11:30 AM EDT by telephone and verified that I am speaking with the correct person using two identifiers.   I discussed the limitations, risks, security and privacy concerns of performing an evaluation and management service by telephone and the availability of in person appointments. I also discussed with the patient that there may be a patient responsible charge related to this service. The patient expressed understanding and agreed to proceed.   I discussed the assessment and treatment plan with the patient. The patient was provided an opportunity to ask questions and all were answered. The patient agreed with the plan and demonstrated an understanding of the instructions.   The patient was advised to call back or seek an in-person evaluation if the symptoms worsen or if the condition fails to improve as anticipated.  I provided 30 minutes of non-face-to-face time during this encounter.  The patient was located at home.  The provider was located at [facility].   Lincoln Renshaw, NP   Subjective:   Patient ID:  Cassie Harrell is a 73 y.o. (DOB 07-13-1951) female.  Chief Complaint:  Chief Complaint  Patient presents with   Anxiety   Depression   Follow-up   Medication Refill   Patient Education   Medication Problem    HPI  "Cassie Harrell", 67 -year-old African-American female presents via telphonic interview for follow up and medication management. She says that she is struggling with a lot of newly diagnosed health issues recently and is trying to address them. Says she stopped Latuda  due to EPS and possible TD. She did not notify office but says she started to have involuntary movements in her mouth. She does not want to add any more medication at this time but  will wait to see how she does. She would like refill on Klonopin . Says this has help  during transition and she is using sparingly. She reports anxiety as 3/10, and depression as 4/10.  Sleeping right now about 6-7 hours per night.  She is currently in a relationship.  She is retired with disability.  She denies any recent mania, no psychosis, no auditory or visual hallucinations.  No SI or HI.  She states that she feels safe with no additional risk.        Review of Systems:  Review of Systems  Constitutional: Negative.   Allergic/Immunologic: Negative.   Neurological: Negative.   Psychiatric/Behavioral:  Positive for dysphoric mood. The patient is nervous/anxious.     Medications: I have reviewed the patient's current medications.  Current Outpatient Medications  Medication Sig Dispense Refill   clonazePAM  (KLONOPIN ) 0.5 MG tablet Take 1 tablet (0.5 mg total) by mouth 2 (two) times daily as needed for anxiety. 60 tablet 1   albuterol  (PROVENTIL  HFA;VENTOLIN  HFA) 108 (90 BASE) MCG/ACT inhaler Inhale 2 puffs into the lungs every 6 (six) hours as needed for wheezing or shortness of breath.      amLODipine (NORVASC) 5 MG tablet Take 5 mg by mouth daily. (Patient not taking: Reported on 02/05/2024)     atorvastatin (LIPITOR) 20 MG tablet Take 20 mg by mouth daily.     brimonidine -timolol  (COMBIGAN ) 0.2-0.5 % ophthalmic solution Place 1 drop into both eyes every 12 (twelve) hours.      estradiol (ESTRACE) 0.1 MG/GM vaginal cream Place vaginally.     famotidine  (PEPCID ) 20 MG tablet Take 1 tablet (20  mg total) by mouth 2 (two) times daily. 30 tablet 0   furosemide (LASIX) 20 MG tablet Take 20 mg by mouth 2 (two) times daily.     ibandronate (BONIVA) 150 MG tablet Take 150 mg by mouth every 30 (thirty) days.     ibuprofen  (ADVIL ,MOTRIN ) 800 MG tablet Take 1 tablet (800 mg total) by mouth 3 (three) times daily. (Patient taking differently: Take 800 mg by mouth as needed.) 30 tablet 0   latanoprost  (XALATAN ) 0.005 % ophthalmic solution Place 1 drop into both eyes at bedtime.       linaclotide  (LINZESS ) 145 MCG CAPS capsule Take 145 mcg by mouth daily as needed (ibs/ constipation).  (Patient not taking: Reported on 02/05/2024)     lisinopril  (ZESTRIL ) 10 MG tablet Take 10 mg by mouth daily. (Patient not taking: Reported on 02/05/2024)     Lurasidone  HCl 60 MG TABS Take 0.5 tablets (30 mg total) by mouth daily after breakfast. (Patient not taking: Reported on 02/05/2024) 30 tablet 1   magnesium oxide (MAG-OX) 400 (240 Mg) MG tablet Take 1 tablet by mouth daily.     metoprolol  succinate (TOPROL -XL) 25 MG 24 hr tablet TAKE 1 TABLET BY MOUTH EVERY DAY 90 tablet 3   Omega-3 Fatty Acids (FISH OIL) 1200 MG CAPS Take 1,200 mg by mouth daily.      OZEMPIC, 1 MG/DOSE, 4 MG/3ML SOPN Inject 1 mg into the skin once a week.     pantoprazole  (PROTONIX ) 40 MG tablet Take 40 mg by mouth 2 (two) times daily.     QUEtiapine  (SEROQUEL ) 100 MG tablet Take 1 tablet (100 mg total) by mouth at bedtime. 90 tablet 1   Suvorexant  (BELSOMRA ) 15 MG TABS TAKE 1 TABLET BY MOUTH AT BEDTIME AS NEEDED FOR SLEEP 30 tablet 3   tiZANidine  (ZANAFLEX ) 4 MG tablet Take 4 mg by mouth 2 (two) times daily.     traZODone  (DESYREL ) 100 MG tablet Take 1.5 tablets (150 mg total) by mouth at bedtime. (Patient not taking: Reported on 02/05/2024) 135 tablet 1   traZODone  (DESYREL ) 50 MG tablet Take 1-2 tablets by mouth at bedtime daily as needed for sleep. 60 tablet 3   umeclidinium bromide  (INCRUSE ELLIPTA ) 62.5 MCG/INH AEPB Inhale 1 puff into the lungs daily.     Vitamin D , Ergocalciferol , (DRISDOL ) 50000 units CAPS capsule Take 50,000 Units by mouth every Saturday.  (Patient not taking: Reported on 02/05/2024)  5   No current facility-administered medications for this visit.    Medication Side Effects: None  Allergies:  Allergies  Allergen Reactions   Diclofenac Palpitations and Other (See Comments)    Other pains   Flagyl  [Metronidazole ] Other (See Comments)    Crazy    Imipramine Rash   Meloxicam Itching    Metoprolol  Tartrate Itching and Rash   Olanzapine Other (See Comments)    Patient preference    Other Rash    Oil of olay moisturizing cream and eye cream caused measles like rash   Vistaril [Hydroxyzine Hcl] Itching and Rash    Past Medical History:  Diagnosis Date   Alcohol abuse with alcohol-induced mood disorder (HCC) 07/31/2016   Anxiety    Bipolar 1 disorder (HCC)    Cocaine abuse (HCC)    Constipation    COPD (chronic obstructive pulmonary disease) (HCC)    Dissociative disorder    Dysrhythmia    irregular heartrate   GERD (gastroesophageal reflux disease)    Glaucoma    Hypertension  Neuropathy of foot    bilateral   Osteoarthritis    osteoarthritis   PTSD (post-traumatic stress disorder)    PVD (peripheral vascular disease) (HCC) 2017   Sleep apnea    has had surgery    Family History  Problem Relation Age of Onset   Depression Mother    Anxiety disorder Mother    Other Mother        natural   Alcoholism Father    Alcohol abuse Father    Heart attack Father    Other Sister        kidney transplant   Goiter Sister    Diabetes Sister    Alcohol abuse Brother    Alcoholism Brother    Diabetes Brother     Social History   Socioeconomic History   Marital status: Single    Spouse name: Not on file   Number of children: 1   Years of education: 14   Highest education level: Associate degree: occupational, Scientist, product/process development, or vocational program  Occupational History    Employer: OTHER    Comment: disabled   Occupation: Retired  Tobacco Use   Smoking status: Every Day    Current packs/day: 0.25    Average packs/day: 0.3 packs/day for 20.0 years (5.0 ttl pk-yrs)    Types: Cigarettes   Smokeless tobacco: Never  Vaping Use   Vaping status: Never Used  Substance and Sexual Activity   Alcohol use: Not Currently   Drug use: Yes    Types: Cocaine    Comment: none since 2015/16   Sexual activity: Yes  Other Topics Concern   Not on file  Social History  Narrative   Patient lives at home alone.   Caffeine Use: quit 2 yrs ago   Social Drivers of Corporate investment banker Strain: Not on file  Food Insecurity: Not on file  Transportation Needs: Not on file  Physical Activity: Not on file  Stress: Not on file  Social Connections: Unknown (02/26/2022)   [facility] from [facility]   Social Network    Social Network: Not on file  Intimate Partner Violence: Unknown (01/18/2022)   Received from [facility]   HITS    Physically Hurt: Not on file    Insult or Talk Down To: Not on file    Threaten Physical Harm: Not on file    Scream or Curse: Not on file    Past Medical History, Surgical history, Social history, and Family history were reviewed and updated as appropriate.   Please see review of systems for further details on the patient's review from today.   Objective:   Physical Exam:  There were no vitals taken for this visit.  Physical Exam Neurological:     Mental Status: She is alert and oriented to person, place, and time.  Psychiatric:        Attention and Perception: Attention and perception normal.        Mood and Affect: Mood normal.        Speech: Speech normal.        Behavior: Behavior normal. Behavior is cooperative.        Cognition and Memory: Cognition and memory normal.        Judgment: Judgment normal.     Comments: Insight intact     Lab Review:     Component Value Date/Time   NA 137 12/11/2023 1243   K 3.6 12/11/2023 1243   CL 108 12/11/2023  1243   CO2 22 12/11/2023 1243   GLUCOSE 103 (H) 12/11/2023 1243   BUN 5 (L) 12/11/2023 1243   CREATININE 0.90 12/11/2023 1243   CREATININE 0.98 03/27/2023 1024   CALCIUM 8.5 (L) 12/11/2023 1243   PROT 6.4 03/27/2023 1024   ALBUMIN 3.9 09/13/2019 2126   AST 13 03/27/2023 1024   ALT 11 03/27/2023 1024   ALKPHOS 54 09/13/2019 2126   BILITOT 0.3 03/27/2023 1024   GFRNONAA >60 12/11/2023 1243   GFRAA >60 09/13/2019 2126        Component Value Date/Time   WBC 4.9 12/11/2023 1243   RBC 4.03 12/11/2023 1243   HGB 13.2 12/11/2023 1243   HCT 38.2 12/11/2023 1243   PLT 309 12/11/2023 1243   MCV 94.8 12/11/2023 1243   MCH 32.8 12/11/2023 1243   MCHC 34.6 12/11/2023 1243   RDW 15.1 12/11/2023 1243   LYMPHSABS 2.6 08/03/2022 1342   MONOABS 0.5 08/03/2022 1342   EOSABS 0.2 08/03/2022 1342   BASOSABS 0.1 08/03/2022 1342    No results found for: "POCLITH", "LITHIUM"   No results found for: "PHENYTOIN", "PHENOBARB", "VALPROATE", "CBMZ"   .res Assessment: Plan:    Recommendations   Greater than 50% of  30  min telephonic interview with patient was spent on counseling and coordination of care.  Talked about her current stability. Depression levels have increased due to many new health diagnosis. She doe not want to add more medications at this time. She is in a loving relationship with man in [place]. Content right now. She is requesting follow up in 2 months this visit.     We reviewed her medications and discussed various sleeping medications.   We agreed to: To continue trazodone  to 50-100 mg daily at bedtime To continue Seroquel  to 100 mg at bedtime To continue Belsomra  15 mg take at bedtime  To continue Klonopin  0.5 mg 1-2 tablets daily as needed for severe anxiety Stopped  Latuda  to 30  mg daily. Take with 350 calorie meal or snack. To report side effects or worsening symptoms promptly To follow up in 3  months  to reasess Provided emergency contact information Reviewed PDMP   Discussed potential metabolic side effects associated with atypical antipsychotics, as well as potential risk for movement side effects. Advised pt to contact office if movement side effects occur.     Lincoln Renshaw, NP             Cassie Harrell was seen today for anxiety, depression, follow-up, medication refill, patient education and medication problem.  Diagnoses and all orders for this visit:  Bipolar 1 disorder (HCC) -      clonazePAM  (KLONOPIN ) 0.5 MG tablet; Take 1 tablet (0.5 mg total) by mouth 2 (two) times daily as needed for anxiety. -     QUEtiapine  (SEROQUEL ) 100 MG tablet; Take 1 tablet (100 mg total) by mouth at bedtime.  Generalized anxiety disorder -     clonazePAM  (KLONOPIN ) 0.5 MG tablet; Take 1 tablet (0.5 mg total) by mouth 2 (two) times daily as needed for anxiety. -     QUEtiapine  (SEROQUEL ) 100 MG tablet; Take 1 tablet (100 mg total) by mouth at bedtime.  Insomnia due to other mental disorder -     clonazePAM  (KLONOPIN ) 0.5 MG tablet; Take 1 tablet (0.5 mg total) by mouth 2 (two) times daily as needed for anxiety. -     QUEtiapine  (SEROQUEL ) 100 MG tablet; Take 1 tablet (100 mg total) by mouth at bedtime. -  Suvorexant  (BELSOMRA ) 15 MG TABS; TAKE 1 TABLET BY MOUTH AT BEDTIME AS NEEDED FOR SLEEP -     traZODone  (DESYREL ) 50 MG tablet; Take 1-2 tablets by mouth at bedtime daily as needed for sleep.    Please see After Visit Summary for patient specific instructions.  No future appointments.  No orders of the defined types were placed in this encounter.     -------------------------------

## 2024-03-19 ENCOUNTER — Other Ambulatory Visit: Payer: Self-pay | Admitting: Nurse Practitioner

## 2024-03-19 DIAGNOSIS — Z1231 Encounter for screening mammogram for malignant neoplasm of breast: Secondary | ICD-10-CM

## 2024-04-02 ENCOUNTER — Other Ambulatory Visit: Payer: Self-pay | Admitting: Nurse Practitioner

## 2024-04-02 DIAGNOSIS — R109 Unspecified abdominal pain: Secondary | ICD-10-CM

## 2024-04-08 ENCOUNTER — Ambulatory Visit
Admission: RE | Admit: 2024-04-08 | Discharge: 2024-04-08 | Discharge status: Home / Self Care | Source: Ambulatory Visit | Attending: Nurse Practitioner

## 2024-04-08 ENCOUNTER — Other Ambulatory Visit: Payer: Self-pay | Admitting: Nurse Practitioner

## 2024-04-08 DIAGNOSIS — R102 Pelvic and perineal pain: Secondary | ICD-10-CM

## 2024-04-08 DIAGNOSIS — R109 Unspecified abdominal pain: Secondary | ICD-10-CM

## 2024-04-09 ENCOUNTER — Ambulatory Visit
Admission: RE | Admit: 2024-04-09 | Discharge: 2024-04-09 | Disposition: A | Discharge status: Home / Self Care | Source: Ambulatory Visit | Attending: Nurse Practitioner | Admitting: Nurse Practitioner

## 2024-04-09 DIAGNOSIS — R102 Pelvic and perineal pain: Secondary | ICD-10-CM

## 2024-04-17 ENCOUNTER — Telehealth: Payer: Self-pay | Admitting: Diagnostic Neuroimaging

## 2024-04-17 NOTE — Telephone Encounter (Signed)
 Attempted to call Pt. No answer, LVM for call back.

## 2024-04-17 NOTE — Telephone Encounter (Signed)
 Spoke w/Pt regarding episode she described. Pt stated she had what was described as a roaring sound in her head after she laid down because she felt as though she would faint. Discussed provider's notes at her last visit 02/05/24 that Pt should f/u with her PCP and cardiology for the recurrent dizziness, lightheadedness and to review medications with PCP and psychiatry for concern of polypharmacy. Also reminded Pt she should not drive until syncope free for 6 mos. Discussed precautions noted and to return to PCP if symptoms worsen or fail to improve. Pt voiced understanding and thanks for call back and that she plans a call to her PCP.

## 2024-04-17 NOTE — Telephone Encounter (Signed)
 Pt returning Phone calll, Informed Pt she will get a call back

## 2024-04-17 NOTE — Telephone Encounter (Signed)
 Pt called to request earlier appt . Pt states that her symptoms has not changed since last time she seen MD. Pt state Sunday  she was getting ready to go out and as soon as she stood up and started to walk to  the door . She kind of heard a roaring sound in her head her body begin to feel hot and she slowly walk to bed  to lay down . Pt felt herself about to faint . Pt states this keep happening and she is fainting a and waking up  and don't remember anything  that happen n. Pt request to see  MD sooner . Pt is concern

## 2024-04-22 ENCOUNTER — Telehealth: Admitting: Behavioral Health

## 2024-04-24 ENCOUNTER — Ambulatory Visit
Admission: RE | Admit: 2024-04-24 | Discharge: 2024-04-24 | Disposition: A | Discharge status: Home / Self Care | Source: Ambulatory Visit | Attending: Nurse Practitioner | Admitting: Nurse Practitioner

## 2024-04-24 ENCOUNTER — Other Ambulatory Visit: Payer: Self-pay | Admitting: Internal Medicine

## 2024-04-24 DIAGNOSIS — Z1231 Encounter for screening mammogram for malignant neoplasm of breast: Secondary | ICD-10-CM

## 2024-05-02 ENCOUNTER — Ambulatory Visit: Admitting: Behavioral Health

## 2024-05-02 ENCOUNTER — Encounter: Payer: Self-pay | Admitting: Behavioral Health

## 2024-05-02 DIAGNOSIS — F99 Mental disorder, not otherwise specified: Secondary | ICD-10-CM

## 2024-05-02 DIAGNOSIS — F3160 Bipolar disorder, current episode mixed, unspecified: Secondary | ICD-10-CM | POA: Diagnosis not present

## 2024-05-02 DIAGNOSIS — F5105 Insomnia due to other mental disorder: Secondary | ICD-10-CM | POA: Diagnosis not present

## 2024-05-02 DIAGNOSIS — F411 Generalized anxiety disorder: Secondary | ICD-10-CM

## 2024-05-02 NOTE — Progress Notes (Signed)
 Cassie Harrell Cassie Harrell [id number] 08-17-1951 73 y.o.  Virtual Visit via Video Note  I connected with pt @ on 05/02/24 at 11:00 AM EDT by a video enabled telemedicine application and verified that I am speaking with the correct person using two identifiers.   I discussed the limitations of evaluation and management by telemedicine and the availability of in person appointments. The patient expressed understanding and agreed to proceed.  I discussed the assessment and treatment plan with the patient. The patient was provided an opportunity to ask questions and all were answered. The patient agreed with the plan and demonstrated an understanding of the instructions.   The patient was advised to call back or seek an in-person evaluation if the symptoms worsen or if the condition fails to improve as anticipated.  I provided 30 minutes of non-face-to-face time during this encounter.  The patient was located at home.  The provider was located at [facility].   Cassie Harrell Pizza, NP   Subjective:   Patient ID:  Cassie Harrell is a 73 y.o. (DOB 07/29/1951) female.  Chief Complaint: No chief complaint on file.   HPI  Cassie Harrell, 22 -year-old African-American female presents via telphonic interview for follow up and medication management. She says that she is struggling with a lot of newly diagnosed health issues recently and is trying to address them. Says she stopped Latuda  due to EPS and possible TD. She did not notify office but says she started to have involuntary movements in her mouth. She says that she has increased rumination recently. She says that she hears songs in her head that keep playing over and over. Depression appears to be worsening. She has now been off Latuda  for a couple of months.  She reports anxiety as 4/10, and depression as 6/10.  Sleeping right now about 6-7 hours per night.  She is currently in a relationship.  She is retired with disability.  She denies  any recent mania, no psychosis, no auditory or visual hallucinations.  No SI or HI.  She states that she feels safe with no additional risk.   Past psychiatric medication trials: Latuda  Seroquel  Geodon  Zolpiden Mirtazapine Effexor  Trazodone  Ativan  Belsomra  Hydroxyzine Gabapentin Lexapro  Klonopin      Review of Systems:  Review of Systems  Constitutional: Negative.   Allergic/Immunologic: Negative.   Neurological: Negative.   Psychiatric/Behavioral:  Positive for dysphoric mood.     Medications: I have reviewed the patient's current medications.  Current Outpatient Medications  Medication Sig Dispense Refill   albuterol  (PROVENTIL  HFA;VENTOLIN  HFA) 108 (90 BASE) MCG/ACT inhaler Inhale 2 puffs into the lungs every 6 (six) hours as needed for wheezing or shortness of breath.      amLODipine (NORVASC) 5 MG tablet Take 5 mg by mouth daily. (Patient not taking: Reported on 02/05/2024)     atorvastatin (LIPITOR) 20 MG tablet Take 20 mg by mouth daily.     brimonidine -timolol  (COMBIGAN ) 0.2-0.5 % ophthalmic solution Place 1 drop into both eyes every 12 (twelve) hours.      clonazePAM  (KLONOPIN ) 0.5 MG tablet Take 1 tablet (0.5 mg total) by mouth 2 (two) times daily as needed for anxiety. 60 tablet 1   estradiol (ESTRACE) 0.1 MG/GM vaginal cream Place vaginally.     famotidine  (PEPCID ) 20 MG tablet Take 1 tablet (20 mg total) by mouth 2 (two) times daily. 30 tablet 0   furosemide (LASIX) 20 MG tablet Take 20 mg by mouth 2 (two) times daily.     ibandronate (  BONIVA) 150 MG tablet Take 150 mg by mouth every 30 (thirty) days.     ibuprofen  (ADVIL ,MOTRIN ) 800 MG tablet Take 1 tablet (800 mg total) by mouth 3 (three) times daily. (Patient taking differently: Take 800 mg by mouth as needed.) 30 tablet 0   latanoprost  (XALATAN ) 0.005 % ophthalmic solution Place 1 drop into both eyes at bedtime.      linaclotide  (LINZESS ) 145 MCG CAPS capsule Take 145 mcg by mouth daily as needed (ibs/  constipation).  (Patient not taking: Reported on 02/05/2024)     lisinopril  (ZESTRIL ) 10 MG tablet Take 10 mg by mouth daily. (Patient not taking: Reported on 02/05/2024)     Lurasidone  HCl 60 MG TABS Take 0.5 tablets (30 mg total) by mouth daily after breakfast. (Patient not taking: Reported on 02/05/2024) 30 tablet 1   magnesium oxide (MAG-OX) 400 (240 Mg) MG tablet Take 1 tablet by mouth daily.     metoprolol  succinate (TOPROL -XL) 25 MG 24 hr tablet TAKE 1 TABLET BY MOUTH EVERY DAY 90 tablet 3   Omega-3 Fatty Acids (FISH OIL) 1200 MG CAPS Take 1,200 mg by mouth daily.      OZEMPIC, 1 MG/DOSE, 4 MG/3ML SOPN Inject 1 mg into the skin once a week.     pantoprazole  (PROTONIX ) 40 MG tablet Take 40 mg by mouth 2 (two) times daily.     QUEtiapine  (SEROQUEL ) 100 MG tablet Take 1 tablet (100 mg total) by mouth at bedtime. 90 tablet 1   Suvorexant  (BELSOMRA ) 15 MG TABS TAKE 1 TABLET BY MOUTH AT BEDTIME AS NEEDED FOR SLEEP 30 tablet 3   tiZANidine  (ZANAFLEX ) 4 MG tablet Take 4 mg by mouth 2 (two) times daily.     traZODone  (DESYREL ) 100 MG tablet Take 1.5 tablets (150 mg total) by mouth at bedtime. (Patient not taking: Reported on 02/05/2024) 135 tablet 1   traZODone  (DESYREL ) 50 MG tablet Take 1-2 tablets by mouth at bedtime daily as needed for sleep. 60 tablet 3   umeclidinium bromide  (INCRUSE ELLIPTA ) 62.5 MCG/INH AEPB Inhale 1 puff into the lungs daily.     Vitamin D , Ergocalciferol , (DRISDOL ) 50000 units CAPS capsule Take 50,000 Units by mouth every Saturday.  (Patient not taking: Reported on 02/05/2024)  5   No current facility-administered medications for this visit.    Medication Side Effects: None  Allergies:  Allergies  Allergen Reactions   Diclofenac Palpitations and Other (See Comments)    Other pains   Flagyl  [Metronidazole ] Other (See Comments)    Crazy    Imipramine Rash   Meloxicam Itching   Metoprolol  Tartrate Itching and Rash   Olanzapine Other (See Comments)    Patient  preference    Other Rash    Oil of olay moisturizing cream and eye cream caused measles like rash   Vistaril [Hydroxyzine Hcl] Itching and Rash    Past Medical History:  Diagnosis Date   Alcohol abuse with alcohol-induced mood disorder (HCC) 07/31/2016   Anxiety    Bipolar 1 disorder (HCC)    Cocaine abuse (HCC)    Constipation    COPD (chronic obstructive pulmonary disease) (HCC)    Dissociative disorder    Dysrhythmia    irregular heartrate   GERD (gastroesophageal reflux disease)    Glaucoma    Hypertension    Neuropathy of foot    bilateral   Osteoarthritis    osteoarthritis   PTSD (post-traumatic stress disorder)    PVD (peripheral vascular disease) (HCC) 2017  Sleep apnea    has had surgery    Family History  Problem Relation Age of Onset   Depression Mother    Anxiety disorder Mother    Other Mother        natural   Alcoholism Father    Alcohol abuse Father    Heart attack Father    Other Sister        kidney transplant   Goiter Sister    Diabetes Sister    Alcohol abuse Brother    Alcoholism Brother    Diabetes Brother     Social History   Socioeconomic History   Marital status: Single    Spouse name: Not on file   Number of children: 1   Years of education: 14   Highest education level: Associate degree: occupational, Scientist, product/process development, or vocational program  Occupational History    Employer: OTHER    Comment: disabled   Occupation: Retired  Tobacco Use   Smoking status: Every Day    Current packs/day: 0.25    Average packs/day: 0.3 packs/day for 20.0 years (5.0 ttl pk-yrs)    Types: Cigarettes   Smokeless tobacco: Never  Vaping Use   Vaping status: Never Used  Substance and Sexual Activity   Alcohol use: Not Currently   Drug use: Yes    Types: Cocaine    Comment: none since 2015/16   Sexual activity: Yes  Other Topics Concern   Not on file  Social History Narrative   Patient lives at home alone.   Caffeine Use: quit 2 yrs ago   Social  Drivers of Corporate investment banker Strain: Not on file  Food Insecurity: Not on file  Transportation Needs: Not on file  Physical Activity: Not on file  Stress: Not on file  Social Connections: Unknown (02/26/2022)   [facility]   Social Network    Social Network: Not on file  Intimate Partner Violence: Unknown (01/18/2022)   Received from [facility]   HITS    Physically Hurt: Not on file    Insult or Talk Down To: Not on file    Threaten Physical Harm: Not on file    Scream or Curse: Not on file    Past Medical History, Surgical history, Social history, and Family history were reviewed and updated as appropriate.   Please see review of systems for further details on the patient's review from today.   Objective:   Physical Exam:  There were no vitals taken for this visit.  Physical Exam Neurological:     Mental Status: She is alert and oriented to person, place, and time.  Psychiatric:        Attention and Perception: Attention and perception normal.        Mood and Affect: Mood normal.        Speech: Speech normal.        Behavior: Behavior normal. Behavior is cooperative.        Cognition and Memory: Cognition and memory normal.        Judgment: Judgment normal.     Comments: Insight intact     Lab Review:     Component Value Date/Time   NA 137 12/11/2023 1243   K 3.6 12/11/2023 1243   CL 108 12/11/2023 1243   CO2 22 12/11/2023 1243   GLUCOSE 103 (H) 12/11/2023 1243   BUN 5 (L) 12/11/2023 1243   CREATININE 0.90 12/11/2023 1243   CREATININE 0.98 03/27/2023 1024  CALCIUM 8.5 (L) 12/11/2023 1243   PROT 6.4 03/27/2023 1024   ALBUMIN 3.9 09/13/2019 2126   AST 13 03/27/2023 1024   ALT 11 03/27/2023 1024   ALKPHOS 54 09/13/2019 2126   BILITOT 0.3 03/27/2023 1024   GFRNONAA >60 12/11/2023 1243   GFRAA >60 09/13/2019 2126       Component Value Date/Time   WBC 4.9 12/11/2023 1243   RBC 4.03 12/11/2023 1243   HGB 13.2 12/11/2023 1243    HCT 38.2 12/11/2023 1243   PLT 309 12/11/2023 1243   MCV 94.8 12/11/2023 1243   MCH 32.8 12/11/2023 1243   MCHC 34.6 12/11/2023 1243   RDW 15.1 12/11/2023 1243   LYMPHSABS 2.6 08/03/2022 1342   MONOABS 0.5 08/03/2022 1342   EOSABS 0.2 08/03/2022 1342   BASOSABS 0.1 08/03/2022 1342    No results found for: POCLITH, LITHIUM   No results found for: PHENYTOIN, PHENOBARB, VALPROATE, CBMZ   .res Assessment: Plan:    Recommendations   Greater than 50% of  30  min telephonic interview with patient was spent on counseling and coordination of care.  Talked about her current stability. Depression levels have increased due to many new health diagnosis. I explained that she is not on any medication right now to appropriately treat bipolar disorder. She reported increase of EPS when increasing Latuda  to 60 mg.     With her Bipolar one diagnosis and previous trials of Seroquel , Geodon , and Latuda  often causing exacerbation of EPS, I am inclined to trial of Caplyta with FDA approved adjunctive therapy with Lithium or Depakote. This medication has show less risk of EPS and Akathesia and as recommended for nighttime administration may help with this patient severe and chronic hx of insomnia.     We reviewed her medications and discussed various sleeping medications.   We agreed to: To start Caplyta 40 mg at bedtime daily. Samples provided. Pt will come by office on Monday to pick up.  To continue trazodone  to 50-100 mg daily at bedtime To continue Seroquel  to 100 mg at bedtime To continue Belsomra  15 mg take at bedtime  To continue Klonopin  0.5 mg 1-2 tablets daily as needed for severe anxiety Stopped  Latuda  to 30  mg daily for losing efficacy. Unable to tolerate higher dose. To report side effects or worsening symptoms promptly To follow up in 4  weeks to reasess Provided emergency contact information Reviewed PDMP   Discussed potential metabolic side effects associated with  atypical antipsychotics, as well as potential risk for movement side effects. Advised pt to contact office if movement side effects occur.     Cassie Harrell Pizza, NP                 Diagnoses and all orders for this visit:  Bipolar I disorder, most recent episode mixed (HCC)  Generalized anxiety disorder  Insomnia due to other mental disorder     Please see After Visit Summary for patient specific instructions.  Future Appointments  Date Time Provider Department Center  05/30/2024  3:30 PM Pizza Cassie A, NP [facility] None    No orders of the defined types were placed in this encounter.     -------------------------------

## 2024-05-06 ENCOUNTER — Telehealth: Payer: Self-pay | Admitting: Behavioral Health

## 2024-05-06 NOTE — Telephone Encounter (Signed)
 Please see message from patient in regards to Caplyta. Reports she has only had one dose, which was last night. She reports she does have COPD and has been doing albuterol  treatments since Friday, still reporting shallow breathing. Has not contacted PCP. She said she didn't know if she was just overthinking, reports has a hx of this.

## 2024-05-06 NOTE — Telephone Encounter (Signed)
 Pt called at 3:36p stating she had talked to [place] but forgot to tell her that she did she her PCP doctor.  She said she had a panic attack this morning and has shallow breathing.  She is over thinking and having intrusive thoughts.  She wants to know if there is anything else she can do or take that won't interfere with her Asthma and COPD.

## 2024-05-06 NOTE — Telephone Encounter (Signed)
 Next appt is 05/30/24. Cassie Harrell has been put on Caplyta but is having a lot of anxiety, not feeling good, didn't sleep good last night and is having a hard time breathing. She is also having spikes in her blood sugars. On the bottle if says this medication can cause falls and she is concerned about it. Please call her at [phone].

## 2024-05-06 NOTE — Telephone Encounter (Signed)
 This medication is not likely the cause of this. She has anxiety about medications. We are running out of medications that would be appropriate for her. Please remind her of this. She needs to continue

## 2024-05-07 NOTE — Telephone Encounter (Signed)
 Called patient with provider recommendations. She reports she will continue with the Caplyta.

## 2024-05-30 ENCOUNTER — Ambulatory Visit (INDEPENDENT_AMBULATORY_CARE_PROVIDER_SITE_OTHER): Admitting: Behavioral Health

## 2024-05-30 DIAGNOSIS — F411 Generalized anxiety disorder: Secondary | ICD-10-CM | POA: Diagnosis not present

## 2024-05-30 DIAGNOSIS — F5105 Insomnia due to other mental disorder: Secondary | ICD-10-CM | POA: Diagnosis not present

## 2024-05-30 DIAGNOSIS — F3160 Bipolar disorder, current episode mixed, unspecified: Secondary | ICD-10-CM

## 2024-05-30 DIAGNOSIS — F319 Bipolar disorder, unspecified: Secondary | ICD-10-CM

## 2024-05-30 DIAGNOSIS — F99 Mental disorder, not otherwise specified: Secondary | ICD-10-CM

## 2024-06-02 ENCOUNTER — Encounter: Payer: Self-pay | Admitting: Behavioral Health

## 2024-06-02 MED ORDER — QUETIAPINE FUMARATE 100 MG PO TABS
100.0000 mg | ORAL_TABLET | Freq: Every day | ORAL | 1 refills | Status: AC
Start: 2024-06-02 — End: ?

## 2024-06-02 MED ORDER — LUMATEPERONE TOSYLATE 42 MG PO CAPS: 42.0000 mg | ORAL_CAPSULE | Freq: Every day | ORAL | 1 refills | Status: DC

## 2024-06-02 MED ORDER — TRAZODONE HCL 50 MG PO TABS: ORAL_TABLET | ORAL | 3 refills | Status: DC

## 2024-06-02 MED ORDER — CLONAZEPAM 0.5 MG PO TABS: 0.5000 mg | ORAL_TABLET | Freq: Two times a day (BID) | ORAL | 1 refills | Status: DC | PRN

## 2024-06-02 NOTE — Progress Notes (Signed)
 [facility]  Patient ID: Cassie Harrell,  MRN: [id number]  PCP: Shelda Atlas, MD  Date of Evaluation: 06/02/2024 Time spent:30 minutes  Virtual Visit via Telephone Note   I connected with pt on 05/30/24 at 1530 hrs EDT by telephone and verified that I am speaking with the correct person using two identifiers.   I discussed the limitations, risks, security and privacy concerns of performing an evaluation and management service by telephone and the availability of in person appointments. I also discussed with the patient that there may be a patient responsible charge related to this service. The patient expressed understanding and agreed to proceed.   I discussed the assessment and treatment plan with the patient. The patient was provided an opportunity to ask questions and all were answered. The patient agreed with the plan and demonstrated an understanding of the instructions.   The patient was advised to call back or seek an in-person evaluation if the symptoms worsen or if the condition fails to improve as anticipated.   I provided 30 minutes of non-face-to-face time during this encounter.  The patient was located at home.  The provider was located at [facility].     Cassie DELENA Pizza, NP      Chief Complaint:  Chief Complaint   Depression; Anxiety; Follow-up; Medication Refill; Patient Education     HISTORY/CURRENT STATUS: HPI  Individual Medical History/ Review of Systems: Changes? :No   [name], 73 -year-old African-American female presents via telphonic interview for follow up and medication management. So far doing much better with Caplyta . She has been sensitive to other AP's in the past developing EPS.  Still says that she continues to hear songs playing in her head but much more muted.  Depression levels have improved.  She has now been off Latuda  for 3 months.  She reports anxiety as 3/10, and depression as 4/10.  Sleeping right  now about 6-7 hours per night.  She is currently in a relationship.  She is retired with disability.  She denies any recent mania, no psychosis, no auditory or visual hallucinations.  No SI or HI.  She states that she feels safe with no additional risk.    Past psychiatric medication trials: Latuda  Seroquel  Geodon  Zolpiden Mirtazapine Effexor  Trazodone  Ativan  Belsomra  Hydroxyzine Gabapentin Lexapro  Klonopin     rgies: Diclofenac, Flagyl  [metronidazole ], Imipramine, Meloxicam, Metoprolol  tartrate, Olanzapine, Other, and Vistaril [hydroxyzine hcl]  Current Medications:  Current Outpatient Medications:    lumateperone  tosylate (CAPLYTA ) 42 MG capsule, Take 1 capsule (42 mg total) by mouth daily., Disp: 30 capsule, Rfl: 1   albuterol  (PROVENTIL  HFA;VENTOLIN  HFA) 108 (90 BASE) MCG/ACT inhaler, Inhale 2 puffs into the lungs every 6 (six) hours as needed for wheezing or shortness of breath. , Disp: , Rfl:    amLODipine (NORVASC) 5 MG tablet, Take 5 mg by mouth daily. (Patient not taking: Reported on 02/05/2024), Disp: , Rfl:    atorvastatin (LIPITOR) 20 MG tablet, Take 20 mg by mouth daily., Disp: , Rfl:    brimonidine -timolol  (COMBIGAN ) 0.2-0.5 % ophthalmic solution, Place 1 drop into both eyes every 12 (twelve) hours. , Disp: , Rfl:    clonazePAM  (KLONOPIN ) 0.5 MG tablet, Take 1 tablet (0.5 mg total) by mouth 2 (two) times daily as needed for anxiety., Disp: 60 tablet, Rfl: 1   estradiol (ESTRACE) 0.1 MG/GM vaginal cream, Place vaginally., Disp: , Rfl:    famotidine  (PEPCID ) 20 MG tablet, Take 1 tablet (20 mg total) by mouth 2 (two) times  daily., Disp: 30 tablet, Rfl: 0   furosemide (LASIX) 20 MG tablet, Take 20 mg by mouth 2 (two) times daily., Disp: , Rfl:    ibandronate (BONIVA) 150 MG tablet, Take 150 mg by mouth every 30 (thirty) days., Disp: , Rfl:    ibuprofen  (ADVIL ,MOTRIN ) 800 MG tablet, Take 1 tablet (800 mg total) by mouth 3 (three) times daily. (Patient taking differently: Take  800 mg by mouth as needed.), Disp: 30 tablet, Rfl: 0   latanoprost  (XALATAN ) 0.005 % ophthalmic solution, Place 1 drop into both eyes at bedtime. , Disp: , Rfl:    linaclotide  (LINZESS ) 145 MCG CAPS capsule, Take 145 mcg by mouth daily as needed (ibs/ constipation).  (Patient not taking: Reported on 02/05/2024), Disp: , Rfl:    lisinopril  (ZESTRIL ) 10 MG tablet, Take 10 mg by mouth daily. (Patient not taking: Reported on 02/05/2024), Disp: , Rfl:    magnesium oxide (MAG-OX) 400 (240 Mg) MG tablet, Take 1 tablet by mouth daily., Disp: , Rfl:    metoprolol  succinate (TOPROL -XL) 25 MG 24 hr tablet, TAKE 1 TABLET BY MOUTH EVERY DAY, Disp: 90 tablet, Rfl: 3   Omega-3 Fatty Acids (FISH OIL) 1200 MG CAPS, Take 1,200 mg by mouth daily. , Disp: , Rfl:    OZEMPIC, 1 MG/DOSE, 4 MG/3ML SOPN, Inject 1 mg into the skin once a week., Disp: , Rfl:    pantoprazole  (PROTONIX ) 40 MG tablet, Take 40 mg by mouth 2 (two) times daily., Disp: , Rfl:    QUEtiapine  (SEROQUEL ) 100 MG tablet, Take 1 tablet (100 mg total) by mouth at bedtime., Disp: 90 tablet, Rfl: 1   Suvorexant  (BELSOMRA ) 15 MG TABS, TAKE 1 TABLET BY MOUTH AT BEDTIME AS NEEDED FOR SLEEP, Disp: 30 tablet, Rfl: 3   tiZANidine  (ZANAFLEX ) 4 MG tablet, Take 4 mg by mouth 2 (two) times daily., Disp: , Rfl:    traZODone  (DESYREL ) 100 MG tablet, Take 1.5 tablets (150 mg total) by mouth at bedtime. (Patient not taking: Reported on 02/05/2024), Disp: 135 tablet, Rfl: 1   traZODone  (DESYREL ) 50 MG tablet, Take 1-2 tablets by mouth at bedtime daily as needed for sleep., Disp: 60 tablet, Rfl: 3   umeclidinium bromide  (INCRUSE ELLIPTA ) 62.5 MCG/INH AEPB, Inhale 1 puff into the lungs daily., Disp: , Rfl:    Vitamin D , Ergocalciferol , (DRISDOL ) 50000 units CAPS capsule, Take 50,000 Units by mouth every Saturday.  (Patient not taking: Reported on 02/05/2024), Disp: , Rfl: 5 Medication Side Effects: none  Family Medical/ Social History: Changes? No  MENTAL HEALTH EXAM:  There  were no vitals taken for this visit.There is no height or weight on file to calculate BMI.  General Appearance: Casual, Neat, and Well Groomed  Eye Contact:  Good  Speech:  Clear and Coherent  Volume:  Normal  Mood:  NA  Affect:  Appropriate  Thought Process:  Coherent  Orientation:  Full (Time, Place, and Person)  Thought Content: Logical   Suicidal Thoughts:  No  Homicidal Thoughts:  No  Memory:  WNL  Judgement:  Good  Insight:  Good  Psychomotor Activity:  Normal  Concentration:  Concentration: Good  Recall:  Good  Fund of Knowledge: Good  Language: Good  Assets:  Desire for Improvement  ADL's:  Intact  Cognition: WNL  Prognosis:  Good    DIAGNOSES:    ICD-10-CM   1. Bipolar I disorder, most recent episode mixed (HCC)  F31.60 lumateperone  tosylate (CAPLYTA ) 42 MG capsule    2. Generalized  anxiety disorder  F41.1 clonazePAM  (KLONOPIN ) 0.5 MG tablet    QUEtiapine  (SEROQUEL ) 100 MG tablet    3. Insomnia due to other mental disorder  F51.05 clonazePAM  (KLONOPIN ) 0.5 MG tablet   F99 traZODone  (DESYREL ) 50 MG tablet    QUEtiapine  (SEROQUEL ) 100 MG tablet    4. Bipolar 1 disorder (HCC)  F31.9 clonazePAM  (KLONOPIN ) 0.5 MG tablet    QUEtiapine  (SEROQUEL ) 100 MG tablet      Receiving Psychotherapy: No    RECOMMENDATIONS:   Recommendations   Greater than 50% of  30  min telephonic interview with patient was spent on counseling and coordination of care.  Talked about her current stability. So far pt is reporting that she is tolerating Caplyta  well. Seeing some improvement with bipolar depression  and mood fluctuations. She understand that it is early with medication trial. Provided samples to patient to continue. RX sent to pharmacy.  She experienced EPS previously when increasing Latuda  to 60 mg.   With her Bipolar one diagnosis and previous trials of Seroquel , Geodon , and Latuda  often causing exacerbation of EPS, I am inclined to trial of Caplyta  with FDA approved  adjunctive therapy with Lithium or Depakote. This medication has show less risk of EPS and Akathesia and as recommended for nighttime administration may help with this patient severe and chronic hx of insomnia.       We reviewed her medications and discussed various sleeping medications.   We agreed to: To continue Caplyta  40 mg at bedtime daily. Samples provided. Pt will come by office on Monday to pick up.  To continue trazodone  to 50-100 mg daily at bedtime To continue Seroquel  to 100 mg at bedtime To continue Belsomra  15 mg take at bedtime  To continue Klonopin  0.5 mg 1-2 tablets daily as needed for severe anxiety To follow up in 4  weeks to reasess Provided emergency contact information Reviewed PDMP      Cassie DELENA Pizza, NP

## 2024-06-17 ENCOUNTER — Telehealth: Payer: Self-pay | Admitting: Behavioral Health

## 2024-06-17 NOTE — Telephone Encounter (Signed)
 Pt called reporting not happy with Caplyta . Having nightmares and real bad headaches.Even after taking something, headaches never goes away. Not helping with intrusive thoughts either. States not in favor of Lithium. Both sister and  brother took Lithium and had mental problems and both dead. Apt 2024/08/26. [facility]  [address]. Contact # [phone]

## 2024-06-18 NOTE — Telephone Encounter (Signed)
 Please see message regarding Caplyta  and advise. I know she has anxiety about meds and does not usually give them enough time to work.

## 2024-06-18 NOTE — Telephone Encounter (Signed)
 Please advise her that I do not have another AP to offer her that does not cause weight gain which I think was her concern.  Not sure she has tried Abilify ? She has already tried Vraylar.  There is Lybalvi but not sure we can get approved with medicare. Explain to her that we are reaching medication max with what she requires in a medication.

## 2024-06-18 NOTE — Telephone Encounter (Signed)
 Pt called back checking status on call 9/2. Contact pt @ [phone]

## 2024-06-18 NOTE — Telephone Encounter (Signed)
 Called patient and reviewed information with her. She says she isn't concerned with weight gain, but since starting the Caplyta  has had nightmares and headaches. She said she doesn't usually have headaches. Doesn't remember if she tried Abilify , but I don't see a record of it in Epic.

## 2024-06-19 ENCOUNTER — Other Ambulatory Visit: Payer: Self-pay

## 2024-06-19 MED ORDER — ARIPIPRAZOLE 5 MG PO TABS
ORAL_TABLET | ORAL | 0 refills | Status: DC
Start: 2024-06-19 — End: 2024-07-21

## 2024-06-19 NOTE — Telephone Encounter (Signed)
 Reviewed recommendations with patient. She stopped Caplyta  2-3 days ago. Rx sent to the requested pharmacy.

## 2024-07-05 ENCOUNTER — Other Ambulatory Visit: Payer: Self-pay | Admitting: Behavioral Health

## 2024-07-05 DIAGNOSIS — F5105 Insomnia due to other mental disorder: Secondary | ICD-10-CM

## 2024-07-06 NOTE — Telephone Encounter (Signed)
 LD 8/25

## 2024-07-07 ENCOUNTER — Telehealth: Payer: Self-pay | Admitting: Behavioral Health

## 2024-07-07 NOTE — Telephone Encounter (Signed)
 Pt wanted to get 90 day supply of Belsomra . I told her he already sent in 30 day supply and I would send msg.    Appt 10/13    [facility] [address]

## 2024-07-09 NOTE — Telephone Encounter (Signed)
 Explained to patient that she has an appt next month and we would not send in 90-day supply because of that.

## 2024-07-14 ENCOUNTER — Telehealth: Payer: Self-pay | Admitting: Behavioral Health

## 2024-07-14 NOTE — Telephone Encounter (Signed)
 Patient called in regarding Abilify  5mg  prescription. States that she was recently prescribed Abilify  starting off with 1/2 a tablet and than gradually increased to 1 tablet. She felt fine with the 1/2 felt calm and wasn't having any panic attacks in the moringngs. She took a whole tablet and had nightmares and strange dreams so she went back down to 1/2 tablet. She would like a rtc to discuss Ph:[phone] Appt 10/16

## 2024-07-15 NOTE — Telephone Encounter (Signed)
 Cassie Harrell called back today. Just wanted to add that she feels that the  music in her head is getting better as well on Abilify . She doesn't hear it every day all day all night like she used to. She wants to continue taking the Abilify .

## 2024-07-15 NOTE — Telephone Encounter (Signed)
 Pt reporting when taking 5 mg Abilify  she was having nightmares/strange dreams. She has decreased back to 2.5 mg for the past 3 days. She reports she likes the Abilify . She said she is calm, not having PA, and when she hears bad things she doesn't react negatively to them. Has FU 10/16.

## 2024-07-16 NOTE — Telephone Encounter (Signed)
That's great, thank you!

## 2024-07-17 ENCOUNTER — Telehealth: Payer: Self-pay | Admitting: Behavioral Health

## 2024-07-17 NOTE — Telephone Encounter (Signed)
 Pt called and said that she likes how the abilify  makes her feel. She is calm and feels normal. However, she is still having nightmares and waking up at 5 am and not able to go to back to sleep. Please call her at [phone]

## 2024-07-18 ENCOUNTER — Other Ambulatory Visit: Payer: Self-pay | Admitting: Behavioral Health

## 2024-07-18 MED ORDER — PRAZOSIN HCL 1 MG PO CAPS: 1.0000 mg | ORAL_CAPSULE | Freq: Every day | ORAL | 0 refills | Status: DC

## 2024-07-18 NOTE — Telephone Encounter (Signed)
 Per discussion with Redell patient needs to give Abilify  more time. Can add prazosin 1 mg for NM.

## 2024-07-18 NOTE — Telephone Encounter (Signed)
 Pt wants to stay on Abilify  2.5 for now and Redell said it was okay and to give the lower dose a couple more weeks. Told pt we could add prazosin 1 mg for NM and she is agreeable to try this. Rx sent to [facility] per request. She said that she sees on FB that other people talk about what medications have worked for them.  She has an appt on 10/16 and she can discuss with Redell at that time.

## 2024-07-18 NOTE — Addendum Note (Signed)
 Addended by: CON BRISTLE T on: 07/18/2024 04:51 PM   Modules accepted: Orders

## 2024-07-20 ENCOUNTER — Other Ambulatory Visit: Payer: Self-pay | Admitting: Behavioral Health

## 2024-07-24 ENCOUNTER — Ambulatory Visit: Attending: Cardiology | Admitting: Cardiology

## 2024-07-24 ENCOUNTER — Telehealth: Payer: Self-pay | Admitting: Cardiology

## 2024-07-24 ENCOUNTER — Encounter: Payer: Self-pay | Admitting: Cardiology

## 2024-07-24 VITALS — BP 123/83 | HR 73 | Ht 63.0 in | Wt 148.0 lb

## 2024-07-24 DIAGNOSIS — R55 Syncope and collapse: Secondary | ICD-10-CM

## 2024-07-24 DIAGNOSIS — R002 Palpitations: Secondary | ICD-10-CM | POA: Diagnosis not present

## 2024-07-24 DIAGNOSIS — E119 Type 2 diabetes mellitus without complications: Secondary | ICD-10-CM | POA: Diagnosis not present

## 2024-07-24 DIAGNOSIS — E7800 Pure hypercholesterolemia, unspecified: Secondary | ICD-10-CM

## 2024-07-24 NOTE — Patient Instructions (Signed)
 Medication Instructions:  Your physician recommends that you continue on your current medications as directed. Please refer to the Current Medication list given to you today.  *If you need a refill on your cardiac medications before your next appointment, please call your pharmacy*  Lab Work: None.  If you have labs (blood work) drawn today and your tests are completely normal, you will receive your results only by: MyChart Message (if you have MyChart) OR A paper copy in the mail If you have any lab test that is abnormal or we need to change your treatment, we will call you to review the results.  Testing/Procedures: None.  Follow-Up: At [facility], you and your health needs are our priority.  As part of our continuing mission to provide you with exceptional heart care, our providers are all part of one team.  This team includes your primary Cardiologist (physician) and Advanced Practice Providers or APPs (Physician Assistants and Nurse Practitioners) who all work together to provide you with the care you need, when you need it.  Your next appointment will be as needed with :     Provider:   Dr. DOROTHA Bergamo

## 2024-07-24 NOTE — Telephone Encounter (Signed)
   Patient states that her AVS says Cannabis electronic cigarette use and she is not sure what this is. She said that she has never done this and wants it removed from her AVS, she is very upset about this.

## 2024-07-24 NOTE — Progress Notes (Signed)
 Cardiology Office Note:  .   Date:  07/24/2024  ID:  Cassie Harrell [place], DOB [birth year], MRN [id number] PCP: Cassie Atlas, MD  [facility] Providers Cardiologist:  None   History of Present Illness: Cassie Harrell   Cassie Harrell is a 73 y.o. African-American female patient referred to me for evaluation of syncope and near syncope.  Past medical history significant for hypertension, DM and obesity now on Ozempic and has lost weight, reactive airway disease with bronchial asthma and chronic cigarette uses,  sleep apnea now resolved post surgery, bipolar disorder, hyperlipidemia, brief atrial tachycardia on event monitor in 2017, tobacco use disorder, remote coccaine use and abstinent over past 20 years presents for evaluation of syncope.   For the past 6 to 8 months, she has been having occasional episodes of syncope.  Every episode is preceded by feeling faint, dizzy, diaphoretic and she feels seem to sit down she has been abstinent from any illicit drugs, still smoking quitting smoking  Cardiac Studies relevent.    ECHOCARDIOGRAM COMPLETE 10/30/2023  Low normal LVEF at 50 to 55% without wall motion abnormality, grade 1 diastolic dysfunction.  Carotid artery duplex 10/30/2023:   Bilateral carotid arteries essentially normal with no significant plaque. Bilateral antegrade vertebral artery flow.  CT CORONARY MORPH W/CTA COR W/SCORE 07/18/2012  Coronary calcium score of 0. No arthrosclerotic coronary artery disease.    Discussed the use of AI scribe software for clinical note transcription with the patient, who gave verbal consent to proceed.  History of Present Illness Cassie Harrell is a 73 year old female with syncope and unstable angina who presents with episodes of syncope and palpitations.  She experiences episodes of syncope, with the most recent occurring approximately three months ago. She senses a sensation in her head before passing out and has experienced  this four times, with the last episode about four to five months ago. She lives upstairs and navigates steps regularly.  Palpitations are characterized by a forceful heartbeat, sometimes upon getting out of bed or changing positions, accompanied by episodes of tachycardia. She feels dizzy and anxious during these episodes but does not lose consciousness. She has unstable angina and occasionally experiences pain in her left arm, though not recently.  She has diabetes, managed with Ozempic every two weeks, which aids in weight loss and diabetes control. She takes atorvastatin 20 mg daily for cholesterol management. Her blood pressure is low, and she uses furosemide as needed for foot swelling.  She smokes about one cigarette daily and has attempted to quit using patches. She expresses a desire to quit smoking.   Labs   Lab Results  Component Value Date   CHOL 129 03/27/2023   HDL 55 03/27/2023   LDLCALC 51 03/27/2023   TRIG 143 03/27/2023   CHOLHDL 2.3 03/27/2023   No results found for: LIPOA  Recent Labs    12/11/23 1243  NA 137  K 3.6  CL 108  CO2 22  GLUCOSE 103*  BUN 5*  CREATININE 0.90  CALCIUM 8.5*  GFRNONAA >60    Lab Results  Component Value Date   ALT 11 03/27/2023   AST 13 03/27/2023   ALKPHOS 54 09/13/2019   BILITOT 0.3 03/27/2023      Latest Ref Rng & Units 12/11/2023   12:43 PM 03/27/2023   10:24 AM 08/03/2022    1:42 PM  CBC  WBC 4.0 - 10.5 K/uL 4.9  4.9  6.5   Hemoglobin 12.0 - 15.0 g/dL 13.2  12.5  12.1   Hematocrit 36.0 - 46.0 % 38.2  38.2  38.3   Platelets 150 - 400 K/uL 309  289  305      Lab Results  Component Value Date   TSH 1.61 03/27/2023    Care everywhere/Faxed External Labs:  Labs 12/19/2023:  Serum creatinine 0.77, eGFR 83 mL, potassium 4.0.  Hb 13.1/HCT 39.3, platelets 304.  ROS  Review of Systems  Cardiovascular:  Negative for chest pain, dyspnea on exertion and leg swelling.   Physical Exam:   VS:  BP 123/83   Pulse 73    Ht 5' 3 (1.6 m)   Wt 148 lb (67.1 kg)   SpO2 98%   BMI 26.22 kg/m    Wt Readings from Last 3 Encounters:  07/24/24 148 lb (67.1 kg)  02/05/24 158 lb (71.7 kg)  12/11/23 162 lb (73.5 kg)    BP Readings from Last 3 Encounters:  07/24/24 123/83  02/05/24 118/69  12/12/23 (!) 140/77   Physical Exam Neck:     Vascular: No carotid bruit or JVD.  Cardiovascular:     Rate and Rhythm: Normal rate and regular rhythm.     Pulses: Intact distal pulses.     Heart sounds: Normal heart sounds. No murmur heard.    No gallop.  Pulmonary:     Effort: Pulmonary effort is normal.     Breath sounds: Normal breath sounds.  Abdominal:     General: Bowel sounds are normal.     Palpations: Abdomen is soft.  Musculoskeletal:     Right lower leg: No edema.     Left lower leg: No edema.    EKG:       PCP EKG 04/22/2024: Normal sinus rhythm at rate of 72 bpm, left anterior fascicular block.  Poor R wave progression, cannot exclude anterolateral infarct old.  IVCD, borderline criteria for LVH.  No significant change from 12/11/2023.  ASSESSMENT AND PLAN: .      ICD-10-CM   1. Vasovagal syncope  R55     2. Hypercholesteremia  E78.00     3. Type 2 diabetes mellitus without complication, without long-term current use of insulin (HCC)  E11.9     4. Palpitations  R00.2      Assessment & Plan Vasovagal syncope Recurrent episodes of vasovagal syncope, last occurring approximately four to five months ago, characterized by lightheadedness and impending fainting, often triggered by standing or stress. Neurologist advised against driving and recommended safety precautions. No cardiac or carotid artery issues contributing to syncope. Episodes are not life-threatening but require management to prevent injury. - Advise to immediately sit or lay down when feeling lightheaded to prevent fainting. - Instruct on counter pressure maneuvers to alleviate symptoms. - Reassure that episodes are not  life-threatening and do not require emergency medical services unless injury occurs. - No further evaluation indicated. Echocardiogram done recently in Jan 2025 essentially normal.  - Can resume driving. - To contact me if new symptoms  Palpitations and premature ventricular contractions (PVCs) Reports of forceful heartbeats and occasional tachycardia, often upon standing or sitting. Previous monitoring showed PVCs, which are not life-threatening. Symptoms may be exacerbated by environmental factors such as smoke exposure. No evidence of unstable angina or significant cardiac pathology. - Reassure that PVCs are not life-threatening and do not require treatment. - Encourage regular physical activity, such as daily walking, to improve overall cardiovascular health.  Type 2 diabetes mellitus, well controlled Diabetes is well controlled with current treatment  regimen, including Ozempic, taken biweekly. Significant weight loss has contributed to improved glycemic control.  Hyperlipidemia, well controlled Hyperlipidemia is well controlled with atorvastatin. Recent cholesterol panel shows LDL at 51, which is optimal.  Tobacco use disorder Continues to smoke approximately one cigarette per day. Expresses desire to quit and has attempted cessation with patches in the past. - Encourage continued efforts to quit smoking and offer support for cessation attempts.   Follow up: PRN  Signed,  Gordy Bergamo, MD, [facility] 07/24/2024, 8:35 PM [facility] [address] [place], [place] [address] Phone: [phone]. Fax:  [phone]

## 2024-07-24 NOTE — Telephone Encounter (Signed)
 Pt called back and also wants to know if she is able to drive now or should she not. Please advise

## 2024-07-24 NOTE — Telephone Encounter (Signed)
 Yes she can.

## 2024-07-25 NOTE — Telephone Encounter (Signed)
 S/w the patient and informed that she can drive now. She verbalized understanding.

## 2024-07-28 ENCOUNTER — Telehealth: Admitting: Behavioral Health

## 2024-07-31 ENCOUNTER — Ambulatory Visit: Admitting: Behavioral Health

## 2024-07-31 ENCOUNTER — Encounter: Payer: Self-pay | Admitting: Behavioral Health

## 2024-07-31 DIAGNOSIS — F99 Mental disorder, not otherwise specified: Secondary | ICD-10-CM

## 2024-07-31 DIAGNOSIS — F3160 Bipolar disorder, current episode mixed, unspecified: Secondary | ICD-10-CM

## 2024-07-31 DIAGNOSIS — F5105 Insomnia due to other mental disorder: Secondary | ICD-10-CM | POA: Diagnosis not present

## 2024-07-31 DIAGNOSIS — F411 Generalized anxiety disorder: Secondary | ICD-10-CM

## 2024-07-31 NOTE — Progress Notes (Signed)
 [facility]  Patient ID: Cassie Harrell,  MRN: [id number]  PCP: Shelda Atlas, MD  Date of Evaluation: 07/31/2024 Time spent:30 minutes  Chief Complaint:  Chief Complaint   Anxiety; Depression; Follow-up; Patient Education; Medication Refill     HISTORY/CURRENT STATUS: HPI 32, 73 -year-old African-American female presents via telphonic interview for follow up and medication management.  Says that she knows we have not had good success the last few medication trials.  However she believes that Abilify  has been working really well this time.  Says that she feels good and much brighter.  She is smiling today and pleasant.  Reports not hearing the songs in her head as reported in previous visits.  For now she is requesting no medication changes or adjustments.    She reports anxiety as 3/10, and depression as 3/10.  Sleeping has improved and getting 7-8 hours per night. She is currently in a relationship.  She is retired with disability.  She denies any recent mania, no psychosis, no auditory or visual hallucinations.  No SI or HI.  She states that she feels safe with no additional risk.    Past psychiatric medication trials: Latuda -EPS Seroquel  Geodon  Zolpiden Mirtazapine Effexor  Trazodone  Ativan  Belsomra  Hydroxyzine Gabapentin Lexapro  Klonopin  Caplyta - Reports could not afford    Individual Medical History/ Review of Systems: Changes? :No   Allergies: Diclofenac, Flagyl  [metronidazole ], Imipramine, Meloxicam, Metoprolol  tartrate, Olanzapine, Other, and Vistaril [hydroxyzine hcl]  Current Medications:  Current Outpatient Medications:    albuterol  (PROVENTIL  HFA;VENTOLIN  HFA) 108 (90 BASE) MCG/ACT inhaler, Inhale 2 puffs into the lungs every 6 (six) hours as needed for wheezing or shortness of breath. , Disp: , Rfl:    ARIPiprazole  (ABILIFY ) 5 MG tablet, TAKE ONE-HALF TO ONE FULL TABLET DAILY, Disp: 30 tablet, Rfl: 0   atorvastatin (LIPITOR) 20  MG tablet, Take 20 mg by mouth daily., Disp: , Rfl:    BELSOMRA  15 MG TABS, TAKE 1 TABLET BY MOUTH AT BEDTIME AS NEEDED FOR SLEEP, Disp: 30 tablet, Rfl: 0   brimonidine -timolol  (COMBIGAN ) 0.2-0.5 % ophthalmic solution, Place 1 drop into both eyes every 12 (twelve) hours. , Disp: , Rfl:    clonazePAM  (KLONOPIN ) 0.5 MG tablet, Take 1 tablet (0.5 mg total) by mouth 2 (two) times daily as needed for anxiety., Disp: 60 tablet, Rfl: 1   estradiol (ESTRACE) 0.1 MG/GM vaginal cream, Place vaginally., Disp: , Rfl:    furosemide (LASIX) 20 MG tablet, Take 20 mg by mouth 2 (two) times daily., Disp: , Rfl:    ibandronate (BONIVA) 150 MG tablet, Take 150 mg by mouth every 30 (thirty) days., Disp: , Rfl:    latanoprost  (XALATAN ) 0.005 % ophthalmic solution, Place 1 drop into both eyes at bedtime. , Disp: , Rfl:    magnesium oxide (MAG-OX) 400 (240 Mg) MG tablet, Take 1 tablet by mouth daily., Disp: , Rfl:    Omega-3 Fatty Acids (FISH OIL) 1200 MG CAPS, Take 1,200 mg by mouth daily. , Disp: , Rfl:    OZEMPIC, 1 MG/DOSE, 4 MG/3ML SOPN, Inject 1 mg into the skin once a week., Disp: , Rfl:    prazosin (MINIPRESS) 1 MG capsule, Take 1 capsule (1 mg total) by mouth at bedtime., Disp: 30 capsule, Rfl: 0   QUEtiapine  (SEROQUEL ) 100 MG tablet, Take 1 tablet (100 mg total) by mouth at bedtime., Disp: 90 tablet, Rfl: 1   tiZANidine  (ZANAFLEX ) 4 MG tablet, Take 4 mg by mouth 2 (two) times daily., Disp: , Rfl:  traZODone  (DESYREL ) 100 MG tablet, Take 1.5 tablets (150 mg total) by mouth at bedtime., Disp: 135 tablet, Rfl: 1   umeclidinium bromide  (INCRUSE ELLIPTA ) 62.5 MCG/INH AEPB, Inhale 1 puff into the lungs daily., Disp: , Rfl:  Medication Side Effects: none  Family Medical/ Social History: Changes? No  MENTAL HEALTH EXAM:  There were no vitals taken for this visit.There is no height or weight on file to calculate BMI.  General Appearance: Casual, Neat, and Well Groomed  Eye Contact:  Good  Speech:  Clear and  Coherent  Volume:  Normal  Mood:  Anxious, Depressed, and Dysphoric  Affect:  Depressed and Anxious  Thought Process:  Coherent  Orientation:  Full (Time, Place, and Person)  Thought Content: Logical   Suicidal Thoughts:  No  Homicidal Thoughts:  No  Memory:  WNL  Judgement:  Good  Insight:  Good  Psychomotor Activity:  Normal  Concentration:  Concentration: Good  Recall:  Good  Fund of Knowledge: Good  Language: Good  Assets:  Desire for Improvement  ADL's:  Intact  Cognition: WNL  Prognosis:  Good    DIAGNOSES:    ICD-10-CM   1. Bipolar I disorder, most recent episode mixed (HCC)  F31.60     2. Generalized anxiety disorder  F41.1     3. Insomnia due to other mental disorder  F51.05    F99       Receiving Psychotherapy: No    RECOMMENDATIONS:  Greater than 50% of  30  min face to face with patient was spent on counseling and coordination of care.  Talked about her current stability.  She is very pleased with how she feels since starting Abilify .  For now recommending no medication changes or adjustments.  She did report that she did not titrate up to the full 5 mg dose of Abilify .  She will start tomorrow.  Refills provided.       We reviewed her medications and discussed various sleeping medications.   We agreed to: To increase Abilify  to 5 mg daily.   To continue trazodone  to 50-100 mg daily at bedtime To continue Seroquel  to 100 mg at bedtime To continue Belsomra  15 mg take at bedtime  To continue Klonopin  0.5 mg 1-2 tablets daily as needed for severe anxiety To follow up in 8  weeks to reasess Provided emergency contact information Reviewed PDMP    [name], NP

## 2024-08-06 ENCOUNTER — Other Ambulatory Visit: Payer: Self-pay | Admitting: Behavioral Health

## 2024-08-06 DIAGNOSIS — F5105 Insomnia due to other mental disorder: Secondary | ICD-10-CM

## 2024-08-19 ENCOUNTER — Other Ambulatory Visit: Payer: Self-pay | Admitting: Behavioral Health

## 2024-09-17 ENCOUNTER — Other Ambulatory Visit: Payer: Self-pay | Admitting: Behavioral Health

## 2024-10-02 ENCOUNTER — Encounter: Payer: Self-pay | Admitting: Behavioral Health

## 2024-10-02 ENCOUNTER — Ambulatory Visit: Admitting: Behavioral Health

## 2024-10-02 DIAGNOSIS — F99 Mental disorder, not otherwise specified: Secondary | ICD-10-CM

## 2024-10-02 DIAGNOSIS — F411 Generalized anxiety disorder: Secondary | ICD-10-CM | POA: Diagnosis not present

## 2024-10-02 DIAGNOSIS — F5105 Insomnia due to other mental disorder: Secondary | ICD-10-CM

## 2024-10-02 DIAGNOSIS — F319 Bipolar disorder, unspecified: Secondary | ICD-10-CM | POA: Diagnosis not present

## 2024-10-02 MED ORDER — TRAZODONE HCL 100 MG PO TABS: 100.0000 mg | ORAL_TABLET | Freq: Every day | ORAL | 1 refills | Status: AC

## 2024-10-02 MED ORDER — QUETIAPINE FUMARATE 100 MG PO TABS: 100.0000 mg | ORAL_TABLET | Freq: Every day | ORAL | 1 refills | Status: AC

## 2024-10-02 MED ORDER — BELSOMRA 15 MG PO TABS: 1.0000 | ORAL_TABLET | Freq: Every evening | ORAL | 1 refills | Status: AC | PRN

## 2024-10-02 MED ORDER — ARIPIPRAZOLE 5 MG PO TABS: ORAL_TABLET | ORAL | 1 refills | Status: AC

## 2024-10-02 NOTE — Progress Notes (Signed)
 Cassie Harrell Cassie Harrell [id number] [birth year] 73 y.o.  Virtual Visit via Telephone Note  I connected with pt on 10/02/2024 at 10:30 AM EST by telephone and verified that I am speaking with the correct person using two identifiers.   I discussed the limitations, risks, security and privacy concerns of performing an evaluation and management service by telephone and the availability of in person appointments. I also discussed with the patient that there may be a patient responsible charge related to this service. The patient expressed understanding and agreed to proceed.   I discussed the assessment and treatment plan with the patient. The patient was provided an opportunity to ask questions and all were answered. The patient agreed with the plan and demonstrated an understanding of the instructions.   The patient was advised to call back or seek an in-person evaluation if the symptoms worsen or if the condition fails to improve as anticipated.  I provided 30 minutes of non-face-to-face time during this encounter.  The patient was located at home.  The provider was located at [facility].   Redell Cassie Pizza, NP   Subjective:   Patient ID:  Cassie Harrell is a 73 y.o. (DOB 14-Oct-1951) female.  Chief Complaint: No chief complaint on file.   HPI Cassie Harrell, 73 -year-old African-American female presents via telphonic interview for follow up and medication management.  She is very happy and bright today.  Says that this is the first holiday in many years that she feels good.  Very thankful of all the positive things in her life.  She believes that Abilify  has been a good fit for her.  Sometimes she still hears the songs in her head but is using distractions to help.  Says the problem is manageable.  For now she is requesting no medication changes or adjustments.    She reports anxiety as 3/10, and depression as 3/10.  Sleeping has improved and getting 7-8 hours per night.  Reports  that she is in a very healthy loving relationship with female friend.  She is retired with disability.  She denies any recent mania, no psychosis, no auditory or visual hallucinations.  No SI or HI.  She states that she feels safe with no additional risk.    Past psychiatric medication trials: Latuda -EPS Seroquel  Geodon  Zolpiden Mirtazapine Effexor  Trazodone  Ativan  Belsomra  Hydroxyzine Gabapentin Lexapro  Klonopin  Caplyta - Reports could not afford   Review of Systems:  Review of Systems  Constitutional: Negative.   Allergic/Immunologic: Negative.   Neurological: Negative.   Psychiatric/Behavioral:  Positive for dysphoric mood.     Medications: I have reviewed the patient's current medications.  Current Outpatient Medications  Medication Sig Dispense Refill   albuterol  (PROVENTIL  HFA;VENTOLIN  HFA) 108 (90 BASE) MCG/ACT inhaler Inhale 2 puffs into the lungs every 6 (six) hours as needed for wheezing or shortness of breath.      ARIPiprazole  (ABILIFY ) 5 MG tablet TAKE 1 TABLET BY MOUTH DAILY 90 tablet 1   atorvastatin (LIPITOR) 20 MG tablet Take 20 mg by mouth daily.     brimonidine -timolol  (COMBIGAN ) 0.2-0.5 % ophthalmic solution Place 1 drop into both eyes every 12 (twelve) hours.      clonazePAM  (KLONOPIN ) 0.5 MG tablet Take 1 tablet (0.5 mg total) by mouth 2 (two) times daily as needed for anxiety. 60 tablet 1   estradiol (ESTRACE) 0.1 MG/GM vaginal cream Place vaginally.     furosemide (LASIX) 20 MG tablet Take 20 mg by mouth 2 (two) times daily.  ibandronate (BONIVA) 150 MG tablet Take 150 mg by mouth every 30 (thirty) days.     latanoprost  (XALATAN ) 0.005 % ophthalmic solution Place 1 drop into both eyes at bedtime.      magnesium oxide (MAG-OX) 400 (240 Mg) MG tablet Take 1 tablet by mouth daily.     Omega-3 Fatty Acids (FISH OIL) 1200 MG CAPS Take 1,200 mg by mouth daily.      OZEMPIC, 1 MG/DOSE, 4 MG/3ML SOPN Inject 1 mg into the skin once a week.     prazosin   (MINIPRESS ) 1 MG capsule TAKE 1 CAPSULE(1 MG) BY MOUTH AT BEDTIME 90 capsule 0   QUEtiapine  (SEROQUEL ) 100 MG tablet Take 1 tablet (100 mg total) by mouth at bedtime. 90 tablet 1   Suvorexant  (BELSOMRA ) 15 MG TABS Take 1 tablet (15 mg total) by mouth at bedtime as needed. for sleep 90 tablet 1   tiZANidine  (ZANAFLEX ) 4 MG tablet Take 4 mg by mouth 2 (two) times daily.     traZODone  (DESYREL ) 100 MG tablet Take 1 tablet (100 mg total) by mouth at bedtime. 90 tablet 1   umeclidinium bromide  (INCRUSE ELLIPTA ) 62.5 MCG/INH AEPB Inhale 1 puff into the lungs daily.     No current facility-administered medications for this visit.    Medication Side Effects: None  Allergies: Allergies[1]  Past Medical History:  Diagnosis Date   Alcohol abuse with alcohol-induced mood disorder (HCC) 07/31/2016   Anxiety    Atopic dermatitis    Atrophic vaginitis    Bipolar 1 disorder (HCC)    Bursitis of both hips    Cervical spondylosis without myelopathy    Chronic idiopathic constipation    Chronic pain syndrome    Cocaine abuse (HCC)    Constipation    COPD (chronic obstructive pulmonary disease) (HCC)    Dissociative disorder    Dysrhythmia    irregular heartrate   GERD (gastroesophageal reflux disease)    Glaucoma    Hallux abducto valgus, left    Hypertension    Hypertriglyceridemia    Lumbar spondylosis with myelopathy    Muscle cramps    Neuropathy of foot    bilateral   Nicotine  dependence    Numbness    Osteoarthritis    osteoarthritis   PTSD (post-traumatic stress disorder)    PVD (peripheral vascular disease) 2017   Sciatica    Sleep apnea    has had surgery   SVT (supraventricular tachycardia)    Swelling, mass, or lump on face    Venous insufficiency     Family History  Problem Relation Age of Onset   Depression Mother    Anxiety disorder Mother    Other Mother        natural   Alcoholism Father    Alcohol abuse Father    Heart attack Father    Other Sister         kidney transplant   Goiter Sister    Diabetes Sister    Alcohol abuse Brother    Alcoholism Brother    Diabetes Brother     Social History   Socioeconomic History   Marital status: Single    Spouse name: Not on file   Number of children: 1   Years of education: 14   Highest education level: Associate degree: occupational, scientist, product/process development, or vocational program  Occupational History    Employer: OTHER    Comment: disabled   Occupation: Retired  Tobacco Use   Smoking status: Every Day  Current packs/day: 0.25    Average packs/day: 0.3 packs/day for 20.0 years (5.0 ttl pk-yrs)    Types: Cigarettes   Smokeless tobacco: Never  Vaping Use   Vaping status: Never Used  Substance and Sexual Activity   Alcohol use: Not Currently   Drug use: Yes    Types: Cocaine    Comment: none since 2015/16   Sexual activity: Yes  Other Topics Concern   Not on file  Social History Narrative   Patient lives at home alone.   Caffeine Use: quit 2 yrs ago   Social Drivers of Health   Tobacco Use: High Risk (07/31/2024)   Patient History    Smoking Tobacco Use: Every Day    Smokeless Tobacco Use: Never    Passive Exposure: Not on file  Financial Resource Strain: Not on file  Food Insecurity: Not on file  Transportation Needs: Not on file  Physical Activity: Not on file  Stress: Not on file  Social Connections: Not on file  Intimate Partner Violence: Not on file  Depression (PHQ2-9): Low Risk (11/14/2022)   Depression (PHQ2-9)    PHQ-2 Score: 1  Alcohol Screen: Not on file  Housing: Not on file  Utilities: Not on file  Health Literacy: Not on file    Past Medical History, Surgical history, Social history, and Family history were reviewed and updated as appropriate.   Please see review of systems for further details on the patient's review from today.   Objective:   Physical Exam:  There were no vitals taken for this visit.  Physical Exam Neurological:     Mental Status: She is  alert and oriented to person, place, and time.  Psychiatric:        Attention and Perception: Attention and perception normal.        Mood and Affect: Mood normal.        Speech: Speech normal.        Behavior: Behavior normal. Behavior is cooperative.        Cognition and Memory: Cognition and memory normal.        Judgment: Judgment normal.     Comments: Insight intact     Lab Review:     Component Value Date/Time   NA 137 12/11/2023 1243   K 3.6 12/11/2023 1243   CL 108 12/11/2023 1243   CO2 22 12/11/2023 1243   GLUCOSE 103 (H) 12/11/2023 1243   BUN 5 (L) 12/11/2023 1243   CREATININE 0.90 12/11/2023 1243   CREATININE 0.98 03/27/2023 1024   CALCIUM 8.5 (L) 12/11/2023 1243   PROT 6.4 03/27/2023 1024   ALBUMIN 3.9 09/13/2019 2126   AST 13 03/27/2023 1024   ALT 11 03/27/2023 1024   ALKPHOS 54 09/13/2019 2126   BILITOT 0.3 03/27/2023 1024   GFRNONAA >60 12/11/2023 1243   GFRAA >60 09/13/2019 2126       Component Value Date/Time   WBC 4.9 12/11/2023 1243   RBC 4.03 12/11/2023 1243   HGB 13.2 12/11/2023 1243   HCT 38.2 12/11/2023 1243   PLT 309 12/11/2023 1243   MCV 94.8 12/11/2023 1243   MCH 32.8 12/11/2023 1243   MCHC 34.6 12/11/2023 1243   RDW 15.1 12/11/2023 1243   LYMPHSABS 2.6 08/03/2022 1342   MONOABS 0.5 08/03/2022 1342   EOSABS 0.2 08/03/2022 1342   BASOSABS 0.1 08/03/2022 1342    No results found for: POCLITH, LITHIUM   No results found for: PHENYTOIN, PHENOBARB, VALPROATE, CBMZ   .res Assessment: Plan:  Greater than 50% of  30  min telephone visit with patient was spent on counseling and coordination of care.  Talked about her current stability.  She is very happy with her current medication regimen.  We talked about her report that this is the first all day and many years that she feels good.  No medication changes or adjustments indicated.  Refills provided.     We agreed to: Continue Abilify  to 5 mg daily.   To continue trazodone   to 50-100 mg daily at bedtime To continue Seroquel  to 100 mg at bedtime To continue Belsomra  15 mg take at bedtime  To continue Klonopin  0.5 mg 1-2 tablets daily as needed for severe anxiety To follow up in 12  weeks to reasess Provided emergency contact information Reviewed PDMP    Redell A. Colin Ellers, NP       Diagnoses and all orders for this visit:  Insomnia due to other mental disorder -     Suvorexant  (BELSOMRA ) 15 MG TABS; Take 1 tablet (15 mg total) by mouth at bedtime as needed. for sleep -     QUEtiapine  (SEROQUEL ) 100 MG tablet; Take 1 tablet (100 mg total) by mouth at bedtime. -     traZODone  (DESYREL ) 100 MG tablet; Take 1 tablet (100 mg total) by mouth at bedtime.  Generalized anxiety disorder -     QUEtiapine  (SEROQUEL ) 100 MG tablet; Take 1 tablet (100 mg total) by mouth at bedtime.  Bipolar 1 disorder (HCC) -     QUEtiapine  (SEROQUEL ) 100 MG tablet; Take 1 tablet (100 mg total) by mouth at bedtime. -     ARIPiprazole  (ABILIFY ) 5 MG tablet; TAKE 1 TABLET BY MOUTH DAILY    Please see After Visit Summary for patient specific instructions.  No future appointments.  No orders of the defined types were placed in this encounter.     -------------------------------     [1]  Allergies Allergen Reactions   Diclofenac Palpitations and Other (See Comments)    Other pains   Flagyl  [Metronidazole ] Other (See Comments)    Crazy    Imipramine Rash   Meloxicam Itching   Metoprolol  Tartrate Itching and Rash   Olanzapine Other (See Comments)    Patient preference    Other Rash    Oil of olay moisturizing cream and eye cream caused measles like rash   Vistaril [Hydroxyzine Hcl] Itching and Rash

## 2024-11-07 ENCOUNTER — Other Ambulatory Visit: Payer: Self-pay | Admitting: Behavioral Health

## 2024-11-07 DIAGNOSIS — F5105 Insomnia due to other mental disorder: Secondary | ICD-10-CM

## 2024-11-07 DIAGNOSIS — F319 Bipolar disorder, unspecified: Secondary | ICD-10-CM

## 2024-11-07 DIAGNOSIS — F411 Generalized anxiety disorder: Secondary | ICD-10-CM

## 2024-11-19 ENCOUNTER — Other Ambulatory Visit: Payer: Self-pay | Admitting: Behavioral Health

## 2024-12-31 ENCOUNTER — Telehealth: Admitting: Behavioral Health
